# Patient Record
Sex: Female | Born: 1950 | Race: White | Hispanic: No | Smoking: Never smoker
Health system: Southern US, Community
[De-identification: ages and names within clinical notes are randomized; demographics above are authoritative.]

## PROBLEM LIST (undated history)

## (undated) DIAGNOSIS — J449 Chronic obstructive pulmonary disease, unspecified: Secondary | ICD-10-CM

## (undated) DIAGNOSIS — T8859XA Other complications of anesthesia, initial encounter: Secondary | ICD-10-CM

## (undated) DIAGNOSIS — E441 Mild protein-calorie malnutrition: Secondary | ICD-10-CM

## (undated) DIAGNOSIS — Z8614 Personal history of Methicillin resistant Staphylococcus aureus infection: Secondary | ICD-10-CM

## (undated) DIAGNOSIS — E042 Nontoxic multinodular goiter: Secondary | ICD-10-CM

## (undated) DIAGNOSIS — D649 Anemia, unspecified: Secondary | ICD-10-CM

## (undated) DIAGNOSIS — I4510 Unspecified right bundle-branch block: Secondary | ICD-10-CM

## (undated) DIAGNOSIS — K219 Gastro-esophageal reflux disease without esophagitis: Secondary | ICD-10-CM

## (undated) DIAGNOSIS — I4891 Unspecified atrial fibrillation: Secondary | ICD-10-CM

## (undated) DIAGNOSIS — M1990 Unspecified osteoarthritis, unspecified site: Secondary | ICD-10-CM

## (undated) DIAGNOSIS — J45909 Unspecified asthma, uncomplicated: Secondary | ICD-10-CM

## (undated) DIAGNOSIS — M069 Rheumatoid arthritis, unspecified: Secondary | ICD-10-CM

## (undated) DIAGNOSIS — F419 Anxiety disorder, unspecified: Secondary | ICD-10-CM

## (undated) DIAGNOSIS — L0390 Cellulitis, unspecified: Secondary | ICD-10-CM

## (undated) DIAGNOSIS — N184 Chronic kidney disease, stage 4 (severe): Secondary | ICD-10-CM

## (undated) DIAGNOSIS — Z79899 Other long term (current) drug therapy: Secondary | ICD-10-CM

## (undated) DIAGNOSIS — I890 Lymphedema, not elsewhere classified: Secondary | ICD-10-CM

## (undated) DIAGNOSIS — Z7901 Long term (current) use of anticoagulants: Secondary | ICD-10-CM

## (undated) DIAGNOSIS — I10 Essential (primary) hypertension: Secondary | ICD-10-CM

## (undated) DIAGNOSIS — Z7960 Long term (current) use of unspecified immunomodulators and immunosuppressants: Secondary | ICD-10-CM

## (undated) DIAGNOSIS — E785 Hyperlipidemia, unspecified: Secondary | ICD-10-CM

## (undated) HISTORY — PX: MOUTH SURGERY: SHX715

## (undated) HISTORY — PX: CHOLECYSTECTOMY: SHX55

## (undated) HISTORY — DX: Cellulitis, unspecified: L03.90

## (undated) HISTORY — DX: Essential (primary) hypertension: I10

## (undated) HISTORY — DX: Anemia, unspecified: D64.9

## (undated) HISTORY — DX: Unspecified osteoarthritis, unspecified site: M19.90

## (undated) HISTORY — PX: FOOT SURGERY: SHX648

## (undated) HISTORY — PX: GALLBLADDER SURGERY: SHX652

## (undated) HISTORY — PX: WISDOM TOOTH EXTRACTION: SHX21

---

## 2005-06-26 ENCOUNTER — Ambulatory Visit: Payer: Self-pay

## 2006-07-17 ENCOUNTER — Ambulatory Visit: Payer: Self-pay | Admitting: General Surgery

## 2006-07-25 ENCOUNTER — Other Ambulatory Visit: Payer: Self-pay

## 2006-07-25 ENCOUNTER — Ambulatory Visit: Payer: Self-pay | Admitting: General Surgery

## 2006-07-31 ENCOUNTER — Ambulatory Visit: Payer: Self-pay | Admitting: General Surgery

## 2006-07-31 HISTORY — PX: CHOLECYSTECTOMY: SHX55

## 2006-08-23 ENCOUNTER — Ambulatory Visit: Payer: Self-pay | Admitting: Specialist

## 2008-01-23 ENCOUNTER — Ambulatory Visit: Payer: Self-pay | Admitting: Specialist

## 2009-06-16 ENCOUNTER — Ambulatory Visit: Payer: Self-pay | Admitting: Specialist

## 2010-07-18 ENCOUNTER — Ambulatory Visit: Payer: Self-pay | Admitting: Unknown Physician Specialty

## 2014-07-22 DIAGNOSIS — F419 Anxiety disorder, unspecified: Secondary | ICD-10-CM | POA: Insufficient documentation

## 2014-07-22 DIAGNOSIS — I10 Essential (primary) hypertension: Secondary | ICD-10-CM | POA: Insufficient documentation

## 2014-07-22 DIAGNOSIS — K219 Gastro-esophageal reflux disease without esophagitis: Secondary | ICD-10-CM | POA: Insufficient documentation

## 2014-07-22 DIAGNOSIS — J309 Allergic rhinitis, unspecified: Secondary | ICD-10-CM | POA: Insufficient documentation

## 2014-07-23 DIAGNOSIS — M1711 Unilateral primary osteoarthritis, right knee: Secondary | ICD-10-CM | POA: Insufficient documentation

## 2017-03-19 DIAGNOSIS — M170 Bilateral primary osteoarthritis of knee: Secondary | ICD-10-CM | POA: Insufficient documentation

## 2020-02-18 ENCOUNTER — Other Ambulatory Visit: Payer: Self-pay | Admitting: Family Medicine

## 2020-02-18 DIAGNOSIS — Z1231 Encounter for screening mammogram for malignant neoplasm of breast: Secondary | ICD-10-CM

## 2020-03-10 ENCOUNTER — Inpatient Hospital Stay: Admission: RE | Admit: 2020-03-10 | Payer: Self-pay | Source: Ambulatory Visit

## 2020-06-14 ENCOUNTER — Inpatient Hospital Stay: Admission: RE | Admit: 2020-06-14 | Payer: Self-pay | Source: Ambulatory Visit

## 2020-06-24 ENCOUNTER — Inpatient Hospital Stay: Admission: RE | Admit: 2020-06-24 | Payer: Self-pay | Source: Ambulatory Visit

## 2020-09-13 DIAGNOSIS — R531 Weakness: Secondary | ICD-10-CM | POA: Insufficient documentation

## 2020-09-13 DIAGNOSIS — R42 Dizziness and giddiness: Secondary | ICD-10-CM | POA: Insufficient documentation

## 2020-09-14 DIAGNOSIS — G8929 Other chronic pain: Secondary | ICD-10-CM | POA: Insufficient documentation

## 2020-09-14 DIAGNOSIS — M25511 Pain in right shoulder: Secondary | ICD-10-CM | POA: Insufficient documentation

## 2020-09-14 DIAGNOSIS — R300 Dysuria: Secondary | ICD-10-CM | POA: Insufficient documentation

## 2020-09-14 DIAGNOSIS — D72829 Elevated white blood cell count, unspecified: Secondary | ICD-10-CM | POA: Insufficient documentation

## 2020-09-14 DIAGNOSIS — W19XXXA Unspecified fall, initial encounter: Secondary | ICD-10-CM | POA: Insufficient documentation

## 2020-10-21 DIAGNOSIS — I480 Paroxysmal atrial fibrillation: Secondary | ICD-10-CM | POA: Insufficient documentation

## 2020-10-21 DIAGNOSIS — E782 Mixed hyperlipidemia: Secondary | ICD-10-CM | POA: Insufficient documentation

## 2020-10-21 DIAGNOSIS — E785 Hyperlipidemia, unspecified: Secondary | ICD-10-CM | POA: Insufficient documentation

## 2020-10-21 DIAGNOSIS — I4891 Unspecified atrial fibrillation: Secondary | ICD-10-CM | POA: Insufficient documentation

## 2020-11-02 DIAGNOSIS — E876 Hypokalemia: Secondary | ICD-10-CM | POA: Insufficient documentation

## 2021-01-18 ENCOUNTER — Other Ambulatory Visit: Payer: Self-pay | Admitting: Orthopedic Surgery

## 2021-01-18 DIAGNOSIS — M25511 Pain in right shoulder: Secondary | ICD-10-CM

## 2021-02-10 ENCOUNTER — Other Ambulatory Visit: Payer: Medicare Other

## 2021-02-28 ENCOUNTER — Ambulatory Visit
Admission: RE | Admit: 2021-02-28 | Discharge: 2021-02-28 | Disposition: A | Discharge status: Home / Self Care | Payer: Medicare Other | Source: Ambulatory Visit | Attending: Podiatry | Admitting: Podiatry

## 2021-02-28 ENCOUNTER — Other Ambulatory Visit: Payer: Self-pay

## 2021-02-28 ENCOUNTER — Other Ambulatory Visit (HOSPITAL_COMMUNITY): Payer: Self-pay | Admitting: Podiatry

## 2021-02-28 ENCOUNTER — Other Ambulatory Visit: Payer: Self-pay | Admitting: Podiatry

## 2021-02-28 ENCOUNTER — Other Ambulatory Visit
Admission: RE | Admit: 2021-02-28 | Discharge: 2021-02-28 | Disposition: A | Discharge status: Home / Self Care | Payer: Medicare Other | Source: Ambulatory Visit | Attending: Podiatry | Admitting: Podiatry

## 2021-02-28 DIAGNOSIS — R600 Localized edema: Secondary | ICD-10-CM

## 2021-02-28 DIAGNOSIS — M7989 Other specified soft tissue disorders: Secondary | ICD-10-CM | POA: Diagnosis present

## 2021-02-28 DIAGNOSIS — L0390 Cellulitis, unspecified: Secondary | ICD-10-CM | POA: Insufficient documentation

## 2021-02-28 DIAGNOSIS — L03116 Cellulitis of left lower limb: Secondary | ICD-10-CM

## 2021-03-03 LAB — AEROBIC CULTURE W GRAM STAIN (SUPERFICIAL SPECIMEN)
Culture: NO GROWTH
Gram Stain: NONE SEEN

## 2021-03-04 ENCOUNTER — Other Ambulatory Visit: Payer: Medicare Other

## 2021-03-15 ENCOUNTER — Other Ambulatory Visit (INDEPENDENT_AMBULATORY_CARE_PROVIDER_SITE_OTHER): Payer: Self-pay | Admitting: Vascular Surgery

## 2021-03-15 DIAGNOSIS — L97519 Non-pressure chronic ulcer of other part of right foot with unspecified severity: Secondary | ICD-10-CM

## 2021-03-15 DIAGNOSIS — M79671 Pain in right foot: Secondary | ICD-10-CM

## 2021-03-15 DIAGNOSIS — M79672 Pain in left foot: Secondary | ICD-10-CM

## 2021-03-17 ENCOUNTER — Ambulatory Visit (INDEPENDENT_AMBULATORY_CARE_PROVIDER_SITE_OTHER): Payer: Medicare Other

## 2021-03-17 ENCOUNTER — Ambulatory Visit (INDEPENDENT_AMBULATORY_CARE_PROVIDER_SITE_OTHER): Payer: Medicare Other | Admitting: Vascular Surgery

## 2021-03-17 ENCOUNTER — Other Ambulatory Visit: Payer: Self-pay

## 2021-03-17 ENCOUNTER — Encounter (INDEPENDENT_AMBULATORY_CARE_PROVIDER_SITE_OTHER): Payer: Self-pay | Admitting: Vascular Surgery

## 2021-03-17 VITALS — BP 119/70 | HR 88 | Resp 16 | Ht 64.0 in | Wt 209.0 lb

## 2021-03-17 DIAGNOSIS — M79672 Pain in left foot: Secondary | ICD-10-CM | POA: Diagnosis not present

## 2021-03-17 DIAGNOSIS — I10 Essential (primary) hypertension: Secondary | ICD-10-CM | POA: Diagnosis not present

## 2021-03-17 DIAGNOSIS — L97511 Non-pressure chronic ulcer of other part of right foot limited to breakdown of skin: Secondary | ICD-10-CM | POA: Diagnosis not present

## 2021-03-17 DIAGNOSIS — M79671 Pain in right foot: Secondary | ICD-10-CM

## 2021-03-17 DIAGNOSIS — L97519 Non-pressure chronic ulcer of other part of right foot with unspecified severity: Secondary | ICD-10-CM

## 2021-03-17 DIAGNOSIS — I480 Paroxysmal atrial fibrillation: Secondary | ICD-10-CM

## 2021-03-17 DIAGNOSIS — I890 Lymphedema, not elsewhere classified: Secondary | ICD-10-CM

## 2021-03-17 DIAGNOSIS — L97509 Non-pressure chronic ulcer of other part of unspecified foot with unspecified severity: Secondary | ICD-10-CM | POA: Insufficient documentation

## 2021-03-17 DIAGNOSIS — K219 Gastro-esophageal reflux disease without esophagitis: Secondary | ICD-10-CM

## 2021-03-17 NOTE — Progress Notes (Signed)
MRN : [id number]  [name] is a 70 y.o. (Sep 20, 1951) female who presents with chief complaint of  Chief Complaint  Patient presents with  . New Patient (Initial Visit)    Ref [name] right toe ulcer,bil feet pain,leg swelling  .  History of Present Illness:   Patient is seen for evaluation of leg swelling. The patient first noticed the swelling remotely but is now concerned because of a significant increase in the overall edema. The swelling is associated with pain and discoloration. The patient notes that in the morning the legs are significantly improved but they steadily worsened throughout the course of the day. Elevation makes the legs better, dependency makes them much worse.   There is a history of ulceration of the tip of the right great toe.  This has been improving per the patient.   The patient denies any recent changes in their medications.  The patient has not been wearing graduated compression.  The patient has no had any past angiography, interventions or vascular surgery.  The patient denies a history of DVT or PE. There is no prior history of phlebitis. There is no history of primary lymphedema.  There is no history of radiation treatment to the groin or pelvis No history of malignancies. No history of trauma or groin or pelvic surgery. No history of foreign travel or parasitic infections area   ABI's obtained today are normal bilaterally  Current Meds  Medication Sig  . acetaminophen (TYLENOL) 650 MG CR tablet Take by mouth.  Marland Kitchen albuterol (VENTOLIN HFA) 108 (90 Base) MCG/ACT inhaler Inhale into the lungs.  Marland Kitchen amLODipine (NORVASC) 10 MG tablet Take 1 tablet by mouth daily.  Marland Kitchen apixaban (ELIQUIS) 5 MG TABS tablet Take 1 tablet by mouth 2 (two) times daily.  Marland Kitchen atorvastatin (LIPITOR) 10 MG tablet Take by mouth.  . budesonide-formoterol (SYMBICORT) 80-4.5 MCG/ACT inhaler Inhale into the lungs.  . ciprofloxacin (CIPRO) 500 MG tablet Take by mouth.  .  diclofenac Sodium (VOLTAREN) 1 % GEL Apply 2 g topically 4 (four) times daily.  Marland Kitchen HYDROcodone-acetaminophen (NORCO/VICODIN) 5-325 MG tablet Take by mouth.  . loratadine (CLARITIN) 10 MG tablet Take by mouth.  . magnesium oxide (MAG-OX) 400 MG tablet Take 1 tablet by mouth daily.  Marland Kitchen PARoxetine (PAXIL) 30 MG tablet Take by mouth.  . potassium chloride (KLOR-CON) 10 MEQ tablet Take by mouth.  . sulfamethoxazole-trimethoprim (BACTRIM DS) 800-160 MG tablet Take by mouth.  . triamterene-hydrochlorothiazide (MAXZIDE-25) 37.5-25 MG tablet Take 1 tablet by mouth daily.    Past Medical History:  Diagnosis Date  . Anemia   . Arthritis   . Cellulitis   . Hypertension       Social History Social History   Tobacco Use  . Smoking status: Never Smoker  . Smokeless tobacco: Never Used  Substance Use Topics  . Alcohol use: Never  . Drug use: Never    Family History Family History  Problem Relation Age of Onset  . Diabetes Mother   . Hypertension Mother   . Hypertension Father   No family history of bleeding/clotting disorders, porphyria or autoimmune disease   Allergies  Allergen Reactions  . Amoxicillin-Pot Clavulanate Nausea Only  . Valdecoxib Rash     REVIEW OF SYSTEMS (Negative unless checked)  Constitutional: '[]'$ Weight loss  '[]'$ Fever  '[]'$ Chills Cardiac: '[]'$ Chest pain   '[]'$ Chest pressure   '[]'$ Palpitations   '[]'$ Shortness of breath when laying flat   '[]'$ Shortness of breath with exertion. Vascular:  '[]'$ Pain in  legs with walking   '[x]'$ Pain in legs at rest  '[]'$ History of DVT   '[]'$ Phlebitis   '[x]'$ Swelling in legs   '[]'$ Varicose veins   '[]'$ Non-healing ulcers Pulmonary:   '[]'$ Uses home oxygen   '[]'$ Productive cough   '[]'$ Hemoptysis   '[]'$ Wheeze  '[]'$ COPD   '[]'$ Asthma Neurologic:  '[]'$ Dizziness   '[]'$ Seizures   '[]'$ History of stroke   '[]'$ History of TIA  '[]'$ Aphasia   '[]'$ Vissual changes   '[]'$ Weakness or numbness in arm   '[]'$ Weakness or numbness in leg Musculoskeletal:   '[]'$ Joint swelling   '[x]'$ Joint pain   '[]'$ Low back  pain Hematologic:  '[]'$ Easy bruising  '[]'$ Easy bleeding   '[]'$ Hypercoagulable state   '[]'$ Anemic Gastrointestinal:  '[]'$ Diarrhea   '[]'$ Vomiting  '[]'$ Gastroesophageal reflux/heartburn   '[]'$ Difficulty swallowing. Genitourinary:  '[]'$ Chronic kidney disease   '[]'$ Difficult urination  '[]'$ Frequent urination   '[]'$ Blood in urine Skin:  '[]'$ Rashes   '[]'$ Ulcers  Psychological:  '[]'$ History of anxiety   '[]'$  History of major depression.  Physical Examination  Vitals:   03/17/21 1419  BP: 119/70  Pulse: 88  Resp: 16  Weight: 209 lb (94.8 kg)  Height: '5\' 4"'$  (1.626 m)   Body mass index is 35.87 kg/m. Gen: WD/WN, NAD Head: [place]/AT, No temporalis wasting.  Ear/Nose/Throat: Hearing grossly intact, nares w/o erythema or drainage, poor dentition Eyes: PER, EOMI, sclera nonicteric.  Neck: Supple, no masses.  No bruit or JVD.  Pulmonary:  Good air movement, clear to auscultation bilaterally, no use of accessory muscles.  Cardiac: RRR, normal S1, S2, no Murmurs. Vascular: right great toe ulcer superficial and noninfected, scattered varicosities present bilaterally.  Mild venous stasis changes to the legs bilaterally.  3-4+ soft pitting edema. Vessel Right Left  Radial Palpable Palpable  PT Palpable Palpable  DP Palpable Palpable  Gastrointestinal: soft, non-distended. No guarding/no peritoneal signs.  Musculoskeletal: M/S 5/5 throughout.  No deformity or atrophy.  Neurologic: CN 2-12 intact. Pain and light touch intact in extremities.  Symmetrical.  Speech is fluent. Motor exam as listed above. Psychiatric: Judgment intact, Mood & affect appropriate for pt's clinical situation. Dermatologic: mild venous rashes.  No changes consistent with cellulitis. Lymph : + lichenification or skin changes of chronic lymphedema.  CBC No results found for: WBC, HGB, HCT, MCV, PLT  BMET No results found for: NA, K, CL, CO2, GLUCOSE, BUN, CREATININE, CALCIUM, GFRNONAA, GFRAA CrCl cannot be calculated (No successful lab value  found.).  COAG No results found for: INR, PROTIME  Radiology US Venous Img Lower Unilateral Left (DVT)  Result Date: 02/28/2021 CLINICAL DATA:  Left lower extremity pain and edema for past week. Evaluate for DVT. EXAM: LEFT LOWER EXTREMITY VENOUS DOPPLER ULTRASOUND TECHNIQUE: Gray-scale sonography with graded compression, as well as color Doppler and duplex ultrasound were performed to evaluate the lower extremity deep venous systems from the level of the common femoral vein and including the common femoral, femoral, profunda femoral, popliteal and calf veins including the posterior tibial, peroneal and gastrocnemius veins when visible. The superficial great saphenous vein was also interrogated. Spectral Doppler was utilized to evaluate flow at rest and with distal augmentation maneuvers in the common femoral, femoral and popliteal veins. COMPARISON:  None. FINDINGS: Contralateral Common Femoral Vein: Respiratory phasicity is normal and symmetric with the symptomatic side. No evidence of thrombus. Normal compressibility. Common Femoral Vein: No evidence of thrombus. Normal compressibility, respiratory phasicity and response to augmentation. Saphenofemoral Junction: No evidence of thrombus. Normal compressibility and flow on color Doppler imaging. Profunda Femoral Vein: No evidence of thrombus. Normal compressibility  and flow on color Doppler imaging. Femoral Vein: No evidence of thrombus. Normal compressibility, respiratory phasicity and response to augmentation. Popliteal Vein: No evidence of thrombus. Normal compressibility, respiratory phasicity and response to augmentation. Calf Veins: No evidence of thrombus. Normal compressibility and flow on color Doppler imaging. Superficial Great Saphenous Vein: No evidence of thrombus. Normal compressibility. Venous Reflux:  None. Other Findings: There is a minimal to moderate amount of subcutaneous edema at the level of the left calf (image 36). IMPRESSION: No  evidence DVT within the left lower extremity. Electronically Signed   By: Sandi Mariscal M.D.   On: 02/28/2021 13:43     Assessment/Plan 1. Skin ulcer of right great toe, limited to breakdown of skin ([place]) This appears superficial and secondary to pressure against the shoe  2. Lymphedema I have had a long discussion with the patient regarding swelling and why it  causes symptoms.  Patient will begin wearing graduated compression stockings class 1 (20-30 mmHg) on a daily basis a prescription was given. The patient will  beginning wearing the stockings first thing in the morning and removing them in the evening. The patient is instructed specifically not to sleep in the stockings.   In addition, behavioral modification will be initiated.  This will include frequent elevation, use of over the counter pain medications and exercise such as walking.  I have reviewed systemic causes for chronic edema such as liver, kidney and cardiac etiologies.  The patient denies problems with these organ systems.    Consideration for a lymph pump will also be made based upon the effectiveness of conservative therapy.  This would help to improve the edema control and prevent sequela such as ulcers and infections   Patient should undergo duplex ultrasound of the venous system to ensure that DVT or reflux is not present.  The patient will follow-up with me after the ultrasound.    3. Benign essential hypertension Continue antihypertensive medications as already ordered, these medications have been reviewed and there are no changes at this time.   4. Paroxysmal A-fib (HCC) Continue antiarrhythmia medications as already ordered, these medications have been reviewed and there are no changes at this time.  Continue anticoagulation as ordered by Cardiology Service   5. Gastroesophageal reflux disease, unspecified whether esophagitis present Continue PPI as already ordered, this medication has been reviewed and  there are no changes at this time.  Avoidence of caffeine and alcohol  Moderate elevation of the head of the bed     Hortencia Pilar, MD  03/17/2021 2:32 PM

## 2021-03-22 ENCOUNTER — Other Ambulatory Visit: Payer: Medicare Other

## 2021-03-26 ENCOUNTER — Other Ambulatory Visit: Payer: Medicare Other

## 2021-05-26 ENCOUNTER — Other Ambulatory Visit: Payer: Self-pay

## 2021-05-26 ENCOUNTER — Encounter: Payer: Medicare Other | Attending: Physician Assistant | Admitting: Physician Assistant

## 2021-05-26 DIAGNOSIS — I482 Chronic atrial fibrillation, unspecified: Secondary | ICD-10-CM | POA: Insufficient documentation

## 2021-05-26 DIAGNOSIS — I872 Venous insufficiency (chronic) (peripheral): Secondary | ICD-10-CM | POA: Diagnosis not present

## 2021-05-26 DIAGNOSIS — I480 Paroxysmal atrial fibrillation: Secondary | ICD-10-CM | POA: Insufficient documentation

## 2021-05-26 DIAGNOSIS — N189 Chronic kidney disease, unspecified: Secondary | ICD-10-CM | POA: Diagnosis not present

## 2021-05-26 DIAGNOSIS — L84 Corns and callosities: Secondary | ICD-10-CM | POA: Insufficient documentation

## 2021-05-26 DIAGNOSIS — I890 Lymphedema, not elsewhere classified: Secondary | ICD-10-CM | POA: Insufficient documentation

## 2021-05-26 DIAGNOSIS — L97812 Non-pressure chronic ulcer of other part of right lower leg with fat layer exposed: Secondary | ICD-10-CM | POA: Diagnosis not present

## 2021-05-26 DIAGNOSIS — Z7901 Long term (current) use of anticoagulants: Secondary | ICD-10-CM | POA: Diagnosis not present

## 2021-05-26 DIAGNOSIS — I129 Hypertensive chronic kidney disease with stage 1 through stage 4 chronic kidney disease, or unspecified chronic kidney disease: Secondary | ICD-10-CM | POA: Insufficient documentation

## 2021-05-26 NOTE — Progress Notes (Addendum)
[name], [name] ([id number]) Visit Report for 05/26/2021 Allergy List Details Patient Name: Haley, [name]. Date of Service: 05/26/2021 10:00 AM Medical Record Number: [id number] Patient Account Number: [id number] Date of Birth/Sex: October 17, 1951 (69 y.o. F) Treating RN: Donnamarie Poag Primary Care Severo Beber: Thereasa Distance Other Clinician: Referring Adryen Cookson: Thereasa Distance Treating Trishelle Devora/Extender: Jeri Cos Weeks in Treatment: 0 Allergies Active Allergies Augmentin Reaction: rash Severity: Moderate Sulfa (Sulfonamide Antibiotics) Reaction: itching Severity: Moderate Allergy Notes Electronic Signature(s) Signed: 05/26/2021 11:06:31 AM By: Donnamarie Poag Entered By: Donnamarie Poag on 05/26/2021 10:30:28 [name], [name] ([id number]) -------------------------------------------------------------------------------- Arrival Information Details Patient Name: [name], [name] Date of Service: 05/26/2021 10:00 AM Medical Record Number: [id number] Patient Account Number: [id number] Date of Birth/Sex: [birth year] (69 y.o. F) Treating RN: Donnamarie Poag Primary Care Callee Rohrig: Thereasa Distance Other Clinician: Referring Tishana Clinkenbeard: Thereasa Distance Treating Linde Wilensky/Extender: Skipper Cliche in Treatment: 0 Visit Information Patient Arrived: Wheel Chair Arrival Time: 10:27 Accompanied By: friend Transfer Assistance: EasyPivot Patient Lift Patient Identification Verified: Yes Secondary Verification Process Completed: Yes Patient Has Alerts: Yes Patient Alerts: Patient on Blood Thinner ELIQUIS ABI at vascular 03/17/21 ABI L-0.69 R-0.67 Electronic Signature(s) Signed: 05/26/2021 11:06:31 AM By: Donnamarie Poag Entered By: Donnamarie Poag on 05/26/2021 10:50:28 [name], [name] ([id number]) -------------------------------------------------------------------------------- Clinic Level of Care Assessment Details Patient Name: [name], [name] Date of Service: 05/26/2021 10:00 AM Medical Record Number:  [id number] Patient Account Number: [id number] Date of Birth/Sex: July 01, 1951 (69 y.o. F) Treating RN: Dolan Amen Primary Care Kathrine Rieves: Thereasa Distance Other Clinician: Referring Takeela Peil: Thereasa Distance Treating Keyandre Pileggi/Extender: Skipper Cliche in Treatment: 0 Clinic Level of Care Assessment Items TOOL 1 Quantity Score X - Use when EandM and Procedure is performed on INITIAL visit 1 0 ASSESSMENTS - Nursing Assessment / Reassessment X - General Physical Exam (combine w/ comprehensive assessment (listed just below) when performed on new 1 20 pt. evals) X- 1 25 Comprehensive Assessment (HX, ROS, Risk Assessments, Wounds Hx, etc.) ASSESSMENTS - Wound and Skin Assessment / Reassessment '[]'$  - Dermatologic / Skin Assessment (not related to wound area) 0 ASSESSMENTS - Ostomy and/or Continence Assessment and Care '[]'$  - Incontinence Assessment and Management 0 '[]'$  - 0 Ostomy Care Assessment and Management (repouching, etc.) PROCESS - Coordination of Care X - Simple Patient / Family Education for ongoing care 1 15 '[]'$  - 0 Complex (extensive) Patient / Family Education for ongoing care X- 1 10 Staff obtains Consents, Records, Test Results / Process Orders '[]'$  - 0 Staff telephones HHA, Nursing Homes / Clarify orders / etc '[]'$  - 0 Routine Transfer to another Facility (non-emergent condition) '[]'$  - 0 Routine Hospital Admission (non-emergent condition) '[]'$  - 0 New Admissions / Biomedical engineer / Ordering NPWT, Apligraf, etc. '[]'$  - 0 Emergency Hospital Admission (emergent condition) PROCESS - Special Needs '[]'$  - Pediatric / Minor Patient Management 0 '[]'$  - 0 Isolation Patient Management '[]'$  - 0 Hearing / Language / Visual special needs '[]'$  - 0 Assessment of Community assistance (transportation, D/C planning, etc.) '[]'$  - 0 Additional assistance / Altered mentation '[]'$  - 0 Support Surface(s) Assessment (bed, cushion, seat, etc.) INTERVENTIONS - Miscellaneous '[]'$  - External ear exam  0 '[]'$  - 0 Patient Transfer (multiple staff / Civil Service fast streamer / Similar devices) '[]'$  - 0 Simple Staple / Suture removal (25 or less) '[]'$  - 0 Complex Staple / Suture removal (26 or more) '[]'$  - 0 Hypo/Hyperglycemic Management (do not check if billed separately) '[]'$  - 0 Ankle / Brachial Index (ABI) - do not check if billed separately Has  the patient been seen at the hospital within the last three years: Yes Total Score: 70 Level Of Care: New/Established - Level 2 [name], [name] ([id number]) Electronic Signature(s) Signed: 05/26/2021 4:29:08 PM By: Georges Mouse, Minus Breeding RN Entered By: Georges Mouse, Minus Breeding on 05/26/2021 11:46:43 [name], [name] ([id number]) -------------------------------------------------------------------------------- Encounter Discharge Information Details Patient Name: [name], [name] Date of Service: 05/26/2021 10:00 AM Medical Record Number: [id number] Patient Account Number: [id number] Date of Birth/Sex: 1951-10-20 (69 y.o. F) Treating RN: Dolan Amen Primary Care Joyell Emami: Thereasa Distance Other Clinician: Referring Hilbert Briggs: Thereasa Distance Treating Sharief Wainwright/Extender: Skipper Cliche in Treatment: 0 Encounter Discharge Information Items Discharge Condition: Stable Ambulatory Status: Wheelchair Discharge Destination: Home Transportation: Private Auto Accompanied By: friend Schedule Follow-up Appointment: Yes Clinical Summary of Care: Electronic Signature(s) Signed: 05/26/2021 4:32:45 PM By: Jeanine Luz Entered By: Jeanine Luz on 05/26/2021 12:06:50 [name], [name] ([id number]) -------------------------------------------------------------------------------- Lower Extremity Assessment Details Patient Name: [name], [name] Date of Service: 05/26/2021 10:00 AM Medical Record Number: [id number] Patient Account Number: [id number] Date of Birth/Sex: [birth year] (69 y.o. F) Treating RN: Donnamarie Poag Primary Care San Lohmeyer: Thereasa Distance Other  Clinician: Referring Sydnee Lamour: Thereasa Distance Treating Frady Taddeo/Extender: Skipper Cliche in Treatment: 0 Edema Assessment Assessed: Shirlyn Goltz: Yes] Patrice Paradise: Yes] Edema: [Left: Yes] [Right: Yes] Calf Left: Right: Point of Measurement: 29 cm From Medial Instep 31 cm 31 cm Ankle Left: Right: Point of Measurement: 10 cm From Medial Instep 18.5 cm 19 cm Knee To Floor Left: Right: From Medial Instep 35 cm 35 cm Vascular Assessment Pulses: Dorsalis Pedis Palpable: [Left:Yes] [Right:Yes] Electronic Signature(s) Signed: 05/26/2021 11:06:31 AM By: Donnamarie Poag Entered ByDonnamarie Poag on 05/26/2021 10:46:52 [name], [name] ([id number]) -------------------------------------------------------------------------------- Multi Wound Chart Details Patient Name: [name], [name] Date of Service: 05/26/2021 10:00 AM Medical Record Number: [id number] Patient Account Number: [id number] Date of Birth/Sex: 1951-07-21 (69 y.o. F) Treating RN: Dolan Amen Primary Care Siaosi Alter: Thereasa Distance Other Clinician: Referring Dartanion Teo: Thereasa Distance Treating Hassel Uphoff/Extender: Skipper Cliche in Treatment: 0 Photos: [N/A:N/A] Wound Location: Right Lower Leg N/A N/A Wounding Event: Gradually Appeared N/A N/A Primary Etiology: Venous Leg Ulcer N/A N/A Comorbid History: Cataracts, Anemia, Asthma, Sleep N/A N/A Apnea, Arrhythmia, Coronary Artery Disease, Hypertension, History of pressure wounds, Rheumatoid Arthritis, Osteoarthritis Date Acquired: 04/20/2021 N/A N/A Weeks of Treatment: 0 N/A N/A Wound Status: Open N/A N/A Measurements L x W x D (cm) 1x0.7x0.1 N/A N/A Area (cm) : 0.55 N/A N/A Volume (cm) : 0.055 N/A N/A Classification: Full Thickness Without Exposed N/A N/A Support Structures Exudate Amount: Medium N/A N/A Exudate Type: Serosanguineous N/A N/A Exudate Color: red, brown N/A N/A Granulation Amount: Large (67-100%) N/A N/A Granulation Quality: Red, Pink, Pale N/A N/A Necrotic  Amount: Small (1-33%) N/A N/A Exposed Structures: Fat Layer (Subcutaneous Tissue): N/A N/A Yes Fascia: No Tendon: No Muscle: No Joint: No Bone: No Treatment Notes Electronic Signature(s) Signed: 05/26/2021 4:29:08 PM By: Georges Mouse, Minus Breeding RN Entered By: Georges Mouse, Minus Breeding on 05/26/2021 11:34:58 [name], [name] ([id number]) -------------------------------------------------------------------------------- [place] Details Patient Name: [name], [name] Date of Service: 05/26/2021 10:00 AM Medical Record Number: [id number] Patient Account Number: [id number] Date of Birth/Sex: November 07, 1951 (69 y.o. F) Treating RN: Dolan Amen Primary Care Tamika Shropshire: Thereasa Distance Other Clinician: Referring Dorothee Napierkowski: Thereasa Distance Treating Seana Underwood/Extender: Skipper Cliche in Treatment: 0 Active Inactive Orientation to the Wound Care Program Nursing Diagnoses: Knowledge deficit related to the wound healing center program Goals: Patient/caregiver will verbalize understanding of the [place] Program Date Initiated: 05/26/2021 Target  Resolution Date: 05/26/2021 Goal Status: Active Interventions: Provide education on orientation to the wound center Notes: Wound/Skin Impairment Nursing Diagnoses: Impaired tissue integrity Goals: Patient/caregiver will verbalize understanding of skin care regimen Date Initiated: 05/26/2021 Target Resolution Date: 05/26/2021 Goal Status: Active Ulcer/skin breakdown will have a volume reduction of 30% by week 4 Date Initiated: 05/26/2021 Target Resolution Date: 06/25/2021 Goal Status: Active Ulcer/skin breakdown will have a volume reduction of 50% by week 8 Date Initiated: 05/26/2021 Target Resolution Date: 07/26/2021 Goal Status: Active Ulcer/skin breakdown will have a volume reduction of 80% by week 12 Date Initiated: 05/26/2021 Target Resolution Date: 08/26/2021 Goal Status: Active Interventions: Assess patient/caregiver ability  to obtain necessary supplies Assess patient/caregiver ability to perform ulcer/skin care regimen upon admission and as needed Assess ulceration(s) every visit Provide education on ulcer and skin care Treatment Activities: Patient referred to home care : 05/26/2021 Skin care regimen initiated : 05/26/2021 Notes: Electronic Signature(s) Signed: 05/26/2021 4:29:08 PM By: Georges Mouse, Minus Breeding RN Entered By: Georges Mouse, Minus Breeding on 05/26/2021 11:33:14 [name], [name] ([id number]) -------------------------------------------------------------------------------- Pain Assessment Details Patient Name: [name], [name] Date of Service: 05/26/2021 10:00 AM Medical Record Number: [id number] Patient Account Number: [id number] Date of Birth/Sex: 1951/08/01 (69 y.o. F) Treating RN: Donnamarie Poag Primary Care Tecia Cinnamon: Thereasa Distance Other Clinician: Referring Macalister Arnaud: Thereasa Distance Treating Kristin Barcus/Extender: Skipper Cliche in Treatment: 0 Active Problems Location of Pain Severity and Description of Pain Patient Has Paino Yes Site Locations Pain Location: Generalized Pain Rate the pain. Current Pain Level: 5 Pain Management and Medication Current Pain Management: Notes LEG PAINS Electronic Signature(s) Signed: 05/26/2021 11:06:31 AM By: Donnamarie Poag Entered By: Donnamarie Poag on 05/26/2021 10:29:00 [name], [name] ([id number]) -------------------------------------------------------------------------------- Patient/Caregiver Education Details Patient Name: Coots, [name] Date of Service: 05/26/2021 10:00 AM Medical Record Number: [id number] Patient Account Number: [id number] Date of Birth/Gender: 02/15/1951 (69 y.o. F) Treating RN: Dolan Amen Primary Care Physician: Thereasa Distance Other Clinician: Referring Physician: Thereasa Distance Treating Physician/Extender: Skipper Cliche in Treatment: 0 Education Assessment Education Provided To: Patient Education Topics  Provided Wound/Skin Impairment: Methods: Explain/Verbal Responses: State content correctly Electronic Signature(s) Signed: 05/26/2021 4:29:08 PM By: Georges Mouse, Minus Breeding RN Entered By: Georges Mouse, Minus Breeding on 05/26/2021 11:47:10 Mallozzi, [name] ([id number]) -------------------------------------------------------------------------------- Wound Assessment Details Patient Name: Humphreys, [name] Date of Service: 05/26/2021 10:00 AM Medical Record Number: [id number] Patient Account Number: [id number] Date of Birth/Sex: [birth year] (69 y.o. F) Treating RN: Donnamarie Poag Primary Care Sahasra Belue: Thereasa Distance Other Clinician: Referring Kristine Chahal: Thereasa Distance Treating Alexiah Koroma/Extender: Skipper Cliche in Treatment: 0 Wound Status Wound Number: 1 Primary Venous Leg Ulcer Etiology: Wound Location: Right Lower Leg Wound Open Wounding Event: Gradually Appeared Status: Date Acquired: 04/20/2021 Comorbid Cataracts, Anemia, Asthma, Sleep Apnea, Arrhythmia, Weeks Of Treatment: 0 History: Coronary Artery Disease, Hypertension, History of pressure Clustered Wound: No wounds, Rheumatoid Arthritis, Osteoarthritis Photos Wound Measurements Length: (cm) 1 Width: (cm) 0.7 Depth: (cm) 0.1 Area: (cm) 0.55 Volume: (cm) 0.055 % Reduction in Area: % Reduction in Volume: Tunneling: No Undermining: No Wound Description Classification: Full Thickness Without Exposed Support Structures Exudate Amount: Medium Exudate Type: Serosanguineous Exudate Color: red, brown Foul Odor After Cleansing: No Slough/Fibrino Yes Wound Bed Granulation Amount: Large (67-100%) Exposed Structure Granulation Quality: Red, Pink, Pale Fascia Exposed: No Necrotic Amount: Small (1-33%) Fat Layer (Subcutaneous Tissue) Exposed: Yes Necrotic Quality: Adherent Slough Tendon Exposed: No Muscle Exposed: No Joint Exposed: No Bone Exposed: No Treatment Notes Wound #1 (Lower Leg) Wound Laterality: Right Cleanser Soap  and Water  Discharge Instruction: Gently cleanse wound with antibacterial soap, rinse and pat dry prior to dressing wounds Peri-Wound Care Ognibene, Alicya W. ([id number]) Topical Primary Dressing Xeroform-HBD 2x2 (in/in) Discharge Instruction: Apply Xeroform-HBD 2x2 (in/in) as directed Secondary Dressing ABD Pad 5x9 (in/in) Discharge Instruction: Cover with ABD pad Secured With 72M ACE Elastic Bandage With VELCRO Brand Closure, 4 (in) Discharge Instruction: Secure kerlix from base of foot to right below knee Kerlix Roll Sterile or Non-Sterile 6-ply 4.5x4 (yd/yd) Discharge Instruction: Apply Kerlix as directed 72M Medipore H Soft Cloth Surgical Tape, 2x2 (in/yd) Discharge Instruction: Secure kerlix Compression Wrap Compression Stockings Add-Ons Electronic Signature(s) Signed: 05/26/2021 11:06:31 AM By: Donnamarie Poag Entered By: Donnamarie Poag on 05/26/2021 10:48:49 Mcneese, [name] ([id number]) -------------------------------------------------------------------------------- [place] Details Patient Name: Coral, [name] Date of Service: 05/26/2021 10:00 AM Medical Record Number: [id number] Patient Account Number: [id number] Date of Birth/Sex: 1951-08-25 (69 y.o. F) Treating RN: Donnamarie Poag Primary Care Doha Boling: Thereasa Distance Other Clinician: Referring Zaiya Annunziato: Thereasa Distance Treating Alesandro Stueve/Extender: Skipper Cliche in Treatment: 0 Vital Signs Time Taken: 10:25 Reference Range: 80 - 120 mg / dl Height (in): 64 Source: Stated Weight (lbs): 207 Source: Stated Body Mass Index (BMI): 35.5 Notes unable to stand on scale Electronic Signature(s) Signed: 05/26/2021 11:06:31 AM By: Donnamarie Poag Entered ByDonnamarie Poag on 05/26/2021 10:29:37

## 2021-05-26 NOTE — Progress Notes (Signed)
[place], [name] ([id number]) Visit Report for 05/26/2021 Abuse/Suicide Risk Screen Details Patient Name: [name]. Date of Service: 05/26/2021 10:00 AM Medical Record Number: [id number] Patient Account Number: [id number] Date of Birth/Sex: 01/29/1951 (69 y.o. Female) Treating RN: Donnamarie Poag Primary Care Mavric Cortright: Thereasa Distance Other Clinician: Referring Bayan Hedstrom: Thereasa Distance Treating Aysia Lowder/Extender: Skipper Cliche in Treatment: 0 Abuse/Suicide Risk Screen Items Answer ABUSE RISK SCREEN: Has anyone close to you tried to hurt or harm you recentlyo No Do you feel uncomfortable with anyone in your familyo No Has anyone forced you do things that you didnot want to doo No Electronic Signature(s) Signed: 05/26/2021 11:06:31 AM By: Donnamarie Poag Entered ByDonnamarie Poag on 05/26/2021 10:39:11 [name], [name] ([id number]) -------------------------------------------------------------------------------- Activities of Daily Living Details Patient Name: [name], [name] Date of Service: 05/26/2021 10:00 AM Medical Record Number: [id number] Patient Account Number: [id number] Date of Birth/Sex: November 04, 1951 (69 y.o. Female) Treating RN: Donnamarie Poag Primary Care Nastasia Kage: Thereasa Distance Other Clinician: Referring Monte Bronder: Thereasa Distance Treating Carletha Dawn/Extender: Skipper Cliche in Treatment: 0 Activities of Daily Living Items Answer Activities of Daily Living (Please select one for each item) Drive Automobile Need Assistance Take Medications Completely Able Use Telephone Completely Able Care for Appearance Completely Able Use Toilet Completely Able Bath / Shower Need Assistance Dress Self Completely Able Feed Self Completely Able Walk Need Assistance Get In / Out Bed Need Assistance Housework Need Assistance Prepare Meals Need Assistance Handle Money Completely Able Shop for Self Not Able Notes needs assist but does not have help at home other than [facility] for  nursing and assist with leg dressings Electronic Signature(s) Signed: 05/26/2021 11:06:31 AM By: Donnamarie Poag Entered By: Donnamarie Poag on 05/26/2021 10:40:55 [name], [name] ([id number]) -------------------------------------------------------------------------------- Education Screening Details Patient Name: [name], [name] Date of Service: 05/26/2021 10:00 AM Medical Record Number: [id number] Patient Account Number: [id number] Date of Birth/Sex: 1951-11-10 (69 y.o. Female) Treating RN: Donnamarie Poag Primary Care Nichalas Coin: Thereasa Distance Other Clinician: Referring Janthony Holleman: Thereasa Distance Treating Eniola Cerullo/Extender: Skipper Cliche in Treatment: 0 Primary Learner Assessed: Patient Learning Preferences/Education Level/Primary Language Learning Preference: Explanation Highest Education Level: High School Preferred Language: English Cognitive Barrier Language Barrier: No Translator Needed: No Memory Deficit: No Emotional Barrier: No Cultural/Religious Beliefs Affecting Medical Care: No Physical Barrier Impaired Vision: No Impaired Hearing: No Decreased Hand dexterity: No Knowledge/Comprehension Knowledge Level: High Comprehension Level: High Ability to understand written instructions: High Ability to understand verbal instructions: High Motivation Anxiety Level: Calm Cooperation: Cooperative Education Importance: Acknowledges Need Interest in Health Problems: Asks Questions Perception: Coherent Willingness to Engage in Self-Management High Activities: Readiness to Engage in Self-Management High Activities: Electronic Signature(s) Signed: 05/26/2021 11:06:31 AM By: Donnamarie Poag Entered ByDonnamarie Poag on 05/26/2021 10:41:40 [name], [name] ([id number]) -------------------------------------------------------------------------------- Fall Risk Assessment Details Patient Name: [name], [name] Date of Service: 05/26/2021 10:00 AM Medical Record Number: [id number] Patient  Account Number: [id number] Date of Birth/Sex: 1951/04/09 (69 y.o. Female) Treating RN: Donnamarie Poag Primary Care Leanthony Rhett: Thereasa Distance Other Clinician: Referring Sumi Lye: Thereasa Distance Treating Tyonna Talerico/Extender: Skipper Cliche in Treatment: 0 Fall Risk Assessment Items Have you had 2 or more falls in the last 12 monthso 0 No Have you had any fall that resulted in injury in the last 12 monthso 0 No FALLS RISK SCREEN History of falling - immediate or within 3 months 0 No Secondary diagnosis (Do you have 2 or more medical diagnoseso) 0 No Ambulatory aid None/bed rest/wheelchair/nurse 0 No Crutches/cane/walker 15 Yes Furniture 30 Yes  Intravenous therapy Access/Saline/Heparin Lock 0 No Gait/Transferring Normal/ bed rest/ wheelchair 0 No Weak (short steps with or without shuffle, stooped but able to lift head while walking, may 0 No seek support from furniture) Impaired (short steps with shuffle, may have difficulty arising from chair, head down, impaired 20 Yes balance) Mental Status Oriented to own ability 0 Yes Electronic Signature(s) Signed: 05/26/2021 11:06:31 AM By: Donnamarie Poag Entered By: Donnamarie Poag on 05/26/2021 10:42:07 [name], [name] ([id number]) -------------------------------------------------------------------------------- Foot Assessment Details Patient Name: [name], [name] Date of Service: 05/26/2021 10:00 AM Medical Record Number: [id number] Patient Account Number: [id number] Date of Birth/Sex: 1951-01-07 (69 y.o. Female) Treating RN: Donnamarie Poag Primary Care Ame Heagle: Thereasa Distance Other Clinician: Referring Anila Bojarski: Thereasa Distance Treating Yareth Macdonnell/Extender: Skipper Cliche in Treatment: 0 Foot Assessment Items Site Locations + = Sensation present, - = Sensation absent, C = Callus, U = Ulcer R = Redness, W = Warmth, M = Maceration, PU = Pre-ulcerative lesion F = Fissure, S = Swelling, D = Dryness Assessment Right: Left: Other Deformity: No  No Prior Foot Ulcer: No No Prior Amputation: No No Charcot Joint: No No Ambulatory Status: Ambulatory With Help Assistance Device: Walker Gait: Steady Electronic Signature(s) Signed: 05/26/2021 11:06:31 AM By: Donnamarie Poag Entered By: Donnamarie Poag on 05/26/2021 10:44:28 Assad, [name] ([id number]) -------------------------------------------------------------------------------- Nutrition Risk Screening Details Patient Name: Vetere, [name] Date of Service: 05/26/2021 10:00 AM Medical Record Number: [id number] Patient Account Number: [id number] Date of Birth/Sex: [birth year] (69 y.o. Female) Treating RN: Donnamarie Poag Primary Care Klint Lezcano: Thereasa Distance Other Clinician: Referring Anjeanette Petzold: Thereasa Distance Treating Jodeen Mclin/Extender: Skipper Cliche in Treatment: 0 Height (in): 64 Weight (lbs): 207 Body Mass Index (BMI): 35.5 Nutrition Risk Screening Items Score Screening NUTRITION RISK SCREEN: I have an illness or condition that made me change the kind and/or amount of food I eat 0 No I eat fewer than two meals per day 0 No I eat few fruits and vegetables, or milk products 0 No I have three or more drinks of beer, liquor or wine almost every day 0 No I have tooth or mouth problems that make it hard for me to eat 0 No I don't always have enough money to buy the food I need 0 No I eat alone most of the time 0 No I take three or more different prescribed or over-the-counter drugs a day 1 Yes Without wanting to, I have lost or gained 10 pounds in the last six months 0 No I am not always physically able to shop, cook and/or feed myself 0 No Nutrition Protocols Good Risk Protocol 0 No interventions needed Moderate Risk Protocol High Risk Proctocol Risk Level: Good Risk Score: 1 Electronic Signature(s) Signed: 05/26/2021 11:06:31 AM By: Donnamarie Poag Entered ByDonnamarie Poag on 05/26/2021 10:42:30

## 2021-05-27 NOTE — Progress Notes (Addendum)
[name], [name] ([id number]) Visit Report for 05/26/2021 Chief Complaint Document Details Patient Name: [name]. Date of Service: 05/26/2021 10:00 AM Medical Record Number: [id number] Patient Account Number: [id number] Date of Birth/Sex: October 07, 1951 (69 y.o. F) Treating RN: Dolan Amen Primary Care Provider: Thereasa Distance Other Clinician: Referring Provider: Thereasa Distance Treating Provider/Extender: Skipper Cliche in Treatment: 0 Information Obtained from: Patient Chief Complaint Right LE Ulcer Electronic Signature(s) Signed: 05/26/2021 11:00:10 AM By: Worthy Keeler PA-C Entered By: Worthy Keeler on 05/26/2021 11:00:10 [name], [name] ([id number]) -------------------------------------------------------------------------------- HPI Details Patient Name: [name] Date of Service: 05/26/2021 10:00 AM Medical Record Number: [id number] Patient Account Number: [id number] Date of Birth/Sex: Jul 31, 1951 (69 y.o. F) Treating RN: Dolan Amen Primary Care Provider: Thereasa Distance Other Clinician: Referring Provider: Thereasa Distance Treating Provider/Extender: Skipper Cliche in Treatment: 0 History of Present Illness HPI Description: 05/26/2021 upon evaluation today patient appears to be doing somewhat poorly in regard to wounds that she is having currently on her lower extremities. With that being said the main issue that I see simply is that she has a lot of edema noted currently. She has seen Dr. Delana Meyer she has an appointment with him within the month as well she tells me. Nonetheless her wounds are dramatically better compared to what they were just a week ago. I saw pictures and to be honest this is a whole lot better. With that being said she was stated to have had cellulitis as well although I do not see anything right now but seems to be an issue that was treated back in March. Currently they have been using Ace wraps on her legs which actually seem to be  doing quite well along with a nonstick pad. With that being said she does have quite a few other issues including some chronic kidney disease, chronic atrial fibrillation for which she is on Eliquis, hypertension, sleep apnea and she is not currently being treated for this though she states she is supposed to be, and chronic venous insufficiency along with lymphedema. Electronic Signature(s) Signed: 05/26/2021 5:20:21 PM By: Worthy Keeler PA-C Entered By: Worthy Keeler on 05/26/2021 17:20:20 [name], [name] ([id number]) -------------------------------------------------------------------------------- Callus Pairing Details Patient Name: [name], [name] Date of Service: 05/26/2021 10:00 AM Medical Record Number: [id number] Patient Account Number: [id number] Date of Birth/Sex: [birth year] (69 y.o. F) Treating RN: Dolan Amen Primary Care Provider: Thereasa Distance Other Clinician: Referring Provider: Thereasa Distance Treating Provider/Extender: Skipper Cliche in Treatment: 0 Procedure Performed for: Non-Wound Location Performed By: Physician Tommie Sams., PA-C Post Procedure Diagnosis Same as Pre-procedure Notes Patient has a significant callus buildup of the Right plantar foot region which is causing pain with walking. I used a #3 Curette to clear this away today. She had no bleeding noted with the procedure but was able to walk without pain following. Electronic Signature(s) Signed: 07/07/2021 8:48:03 AM By: Worthy Keeler PA-C Entered By: Worthy Keeler on 07/07/2021 08:48:02 [name], [name] ([id number]) -------------------------------------------------------------------------------- Physical Exam Details Patient Name: [name], [name] Date of Service: 05/26/2021 10:00 AM Medical Record Number: [id number] Patient Account Number: [id number] Date of Birth/Sex: 02-Aug-1951 (69 y.o. F) Treating RN: Dolan Amen Primary Care Provider: Thereasa Distance Other Clinician: Referring  Provider: Thereasa Distance Treating Provider/Extender: Jeri Cos Weeks in Treatment: 0 Constitutional Well-nourished and well-hydrated in no acute distress. Eyes conjunctiva clear no eyelid edema noted. pupils equal round and reactive to light and accommodation. Ears, Nose, Mouth, and Throat no gross  abnormality of ear auricles or external auditory canals. normal hearing noted during conversation. mucus membranes moist. Respiratory normal breathing without difficulty. Cardiovascular 1+ dorsalis pedis/posterior tibialis pulses. 2+ pitting edema of the bilateral lower extremities. Psychiatric this patient is able to make decisions and demonstrates good insight into disease process. Alert and Oriented x 3. pleasant and cooperative. Notes Upon inspection patient's wound bed actually showed signs of good granulation epithelization at this point. I do not see any signs of active infection at this time which is great news. Overall very pleased with where things stand currently. She does have significant edema of the lower extremities but the good news is there does not appear to be any signs of active infection at this time which is great news. No fevers, chills, nausea, vomiting, or diarrhea. Electronic Signature(s) Signed: 05/26/2021 5:29:08 PM By: Worthy Keeler PA-C Entered By: Worthy Keeler on 05/26/2021 17:29:08 [name], [name] ([id number]) -------------------------------------------------------------------------------- Physician Orders Details Patient Name: [name], [name] Date of Service: 05/26/2021 10:00 AM Medical Record Number: [id number] Patient Account Number: [id number] Date of Birth/Sex: 09/24/1951 (69 y.o. F) Treating RN: Dolan Amen Primary Care Provider: Thereasa Distance Other Clinician: Referring Provider: Thereasa Distance Treating Provider/Extender: Skipper Cliche in Treatment: 0 Verbal / Phone Orders: No Diagnosis Coding ICD-10 Coding Code Description I87.2  Venous insufficiency (chronic) (peripheral) I89.0 Lymphedema, not elsewhere classified L97.812 Non-pressure chronic ulcer of other part of right lower leg with fat layer exposed I48.0 Paroxysmal atrial fibrillation Z79.01 Long term (current) use of anticoagulants G47.30 Sleep apnea, unspecified I10 Essential (primary) hypertension L84 Corns and callosities Follow-up Appointments o Return Appointment in 1 week. o Nurse Visit as needed [place]: [facility] for wound care. May utilize formulary equivalent dressing for wound treatment orders unless otherwise specified. Home Health Nurse may visit PRN to address patientos wound care needs. o Scheduled days for dressing changes to be completed; exception, patient has scheduled wound care visit that day. o **Please direct any NON-WOUND related issues/requests for orders to patient's Primary Care Physician. **If current dressing causes regression in wound condition, may D/C ordered dressing product/s and apply Normal Saline Moist Dressing daily until next [place] or Other MD appointment. **Notify [facility] of regression in wound condition at [phone]. Bathing/ Shower/ Hygiene o May shower with wound dressing protected with water repellent cover or cast protector. o No tub bath. Edema Control - Lymphedema / Segmental Compressive Device / Other o Ace wraps o Elevate legs to the level of the heart and pump ankles as often as possible o Elevate leg(s) parallel to the floor when sitting. o DO YOUR BEST to sleep in the bed at night. DO NOT sleep in your recliner. Long hours of sitting in a recliner leads to swelling of the legs and/or potential wounds on your backside. Wound Treatment Wound #1 - Lower Leg Wound Laterality: Right Cleanser: Soap and Water 1 x Per Day/15 Days Discharge Instructions: Gently cleanse wound with antibacterial soap, rinse  and pat dry prior to dressing wounds Primary Dressing: Xeroform-HBD 2x2 (in/in) 1 x Per Day/15 Days Discharge Instructions: Apply Xeroform-HBD 2x2 (in/in) as directed Secondary Dressing: ABD Pad 5x9 (in/in) 1 x Per Day/15 Days Discharge Instructions: Cover with ABD pad Secured With: 6M ACE Elastic Bandage With VELCRO Brand Closure, 4 (in) 1 x Per Day/15 Days Discharge Instructions: Secure kerlix from base of foot to right below knee Secured With: Seth Ward  Surgical Tape, 2x2 (in/yd) 1 x Per [id number] Days Discharge Instructions: Secure kerlix Secured With: Kerlix Roll Sterile or Non-Sterile 6-ply 4.5x4 (yd/yd) 1 x Per Day/15 Days [name], [name] ([id number]) Discharge Instructions: Apply Kerlix as directed Electronic Signature(s) Signed: 05/26/2021 4:29:08 PM By: Charlett Nose RN Signed: 05/26/2021 5:38:58 PM By: Worthy Keeler PA-C Entered By: Georges Mouse, Minus Breeding on 05/26/2021 11:48:54 [name], [name] ([id number]) -------------------------------------------------------------------------------- Problem List Details Patient Name: [name] Date of Service: 05/26/2021 10:00 AM Medical Record Number: [id number] Patient Account Number: [id number] Date of Birth/Sex: [birth year] (69 y.o. F) Treating RN: Dolan Amen Primary Care Provider: Thereasa Distance Other Clinician: Referring Provider: Thereasa Distance Treating Provider/Extender: Skipper Cliche in Treatment: 0 Active Problems ICD-10 Encounter Code Description Active Date MDM Diagnosis I87.2 Venous insufficiency (chronic) (peripheral) 05/26/2021 No Yes I89.0 Lymphedema, not elsewhere classified 05/26/2021 No Yes L97.812 Non-pressure chronic ulcer of other part of right lower leg with fat layer 05/26/2021 No Yes exposed I48.0 Paroxysmal atrial fibrillation 05/26/2021 No Yes Z79.01 Long term (current) use of anticoagulants 05/26/2021 No Yes G47.30 Sleep apnea, unspecified 05/26/2021 No Yes I10 Essential (primary)  hypertension 05/26/2021 No Yes L84 Corns and callosities 05/26/2021 No Yes Inactive Problems Resolved Problems Electronic Signature(s) Signed: 05/26/2021 4:29:08 PM By: Georges Mouse, Minus Breeding RN Signed: 05/26/2021 5:38:58 PM By: Worthy Keeler PA-C Previous Signature: 05/26/2021 10:59:50 AM Version By: Worthy Keeler PA-C Previous Signature: 05/26/2021 10:59:30 AM Version By: Worthy Keeler PA-C Entered By: Georges Mouse, Minus Breeding on 05/26/2021 11:35:15 [name], [name] ([id number]) -------------------------------------------------------------------------------- Progress Note Details Patient Name: [name], [name] Date of Service: 05/26/2021 10:00 AM Medical Record Number: [id number] Patient Account Number: [id number] Date of Birth/Sex: [birth year] (69 y.o. F) Treating RN: Dolan Amen Primary Care Provider: Thereasa Distance Other Clinician: Referring Provider: Thereasa Distance Treating Provider/Extender: Skipper Cliche in Treatment: 0 Subjective Chief Complaint Information obtained from Patient Right LE Ulcer History of Present Illness (HPI) 05/26/2021 upon evaluation today patient appears to be doing somewhat poorly in regard to wounds that she is having currently on her lower extremities. With that being said the main issue that I see simply is that she has a lot of edema noted currently. She has seen Dr. Delana Meyer she has an appointment with him within the month as well she tells me. Nonetheless her wounds are dramatically better compared to what they were just a week ago. I saw pictures and to be honest this is a whole lot better. With that being said she was stated to have had cellulitis as well although I do not see anything right now but seems to be an issue that was treated back in March. Currently they have been using Ace wraps on her legs which actually seem to be doing quite well along with a nonstick pad. With that being said she does have quite a few other issues including some  chronic kidney disease, chronic atrial fibrillation for which she is on Eliquis, hypertension, sleep apnea and she is not currently being treated for this though she states she is supposed to be, and chronic venous insufficiency along with lymphedema. Patient History Information obtained from Patient. Allergies Augmentin (Severity: Moderate, Reaction: rash), Sulfa (Sulfonamide Antibiotics) (Severity: Moderate, Reaction: itching) Social History Never smoker, Marital Status - Divorced, Alcohol Use - Never, Drug Use - No History, Caffeine Use - Rarely. Medical History Eyes Patient has history of Cataracts Hematologic/Lymphatic Patient has history of Anemia Respiratory Patient has history of Asthma, Sleep Apnea Cardiovascular Patient has history  of Arrhythmia - afib, Coronary Artery Disease, Hypertension Integumentary (Skin) Patient has history of History of pressure wounds Musculoskeletal Patient has history of Rheumatoid Arthritis, Osteoarthritis Hospitalization/Surgery History - left foot surgery. - cholecystectomy. - cesarean. Review of Systems (ROS) Constitutional Symptoms (General Health) Denies complaints or symptoms of Fatigue, Fever, Chills, Marked Weight Change. Eyes Complains or has symptoms of Glasses / Contacts. Ear/Nose/Mouth/Throat Complains or has symptoms of Sinusitis. Respiratory Complains or has symptoms of Shortness of Breath. Cardiovascular Complains or has symptoms of LE edema. Gastrointestinal Complains or has symptoms of Nausea, GERD Endocrine Denies complaints or symptoms of Hepatitis, Thyroid disease, Polydypsia (Excessive Thirst). Genitourinary Denies complaints or symptoms of Kidney failure/ Dialysis, Incontinence/dribbling. Immunological Complains or has symptoms of Itching. Integumentary (Skin) Complains or has symptoms of Swelling. Musculoskeletal [place]rod, [place] ([id number]) Complains or has symptoms of Muscle Pain, Muscle  Weakness. Neurologic Denies complaints or symptoms of Numbness/parasthesias, Focal/Weakness. Psychiatric Complains or has symptoms of Anxiety. Objective Constitutional Well-nourished and well-hydrated in no acute distress. Vitals Time Taken: 10:25 AM, Height: 64 in, Source: Stated, Weight: 207 lbs, Source: Stated, BMI: 35.5. General Notes: unable to stand on scale Eyes conjunctiva clear no eyelid edema noted. pupils equal round and reactive to light and accommodation. Ears, Nose, Mouth, and Throat no gross abnormality of ear auricles or external auditory canals. normal hearing noted during conversation. mucus membranes moist. Respiratory normal breathing without difficulty. Cardiovascular 1+ dorsalis pedis/posterior tibialis pulses. 2+ pitting edema of the bilateral lower extremities. Psychiatric this patient is able to make decisions and demonstrates good insight into disease process. Alert and Oriented x 3. pleasant and cooperative. General Notes: Upon inspection patient's wound bed actually showed signs of good granulation epithelization at this point. I do not see any signs of active infection at this time which is great news. Overall very pleased with where things stand currently. She does have significant edema of the lower extremities but the good news is there does not appear to be any signs of active infection at this time which is great news. No fevers, chills, nausea, vomiting, or diarrhea. Integumentary (Hair, Skin) Wound #1 status is Open. Original cause of wound was Gradually Appeared. The date acquired was: 04/20/2021. The wound is located on the Right Lower Leg. The wound measures 1cm length x 0.7cm width x 0.1cm depth; 0.55cm^2 area and 0.055cm^3 volume. There is Fat Layer (Subcutaneous Tissue) exposed. There is no tunneling or undermining noted. There is a medium amount of serosanguineous drainage noted. There is large (67-100%) red, pink, pale granulation within the wound  bed. There is a small (1-33%) amount of necrotic tissue within the wound bed including Adherent Slough. Assessment Active Problems ICD-10 Venous insufficiency (chronic) (peripheral) Lymphedema, not elsewhere classified Non-pressure chronic ulcer of other part of right lower leg with fat layer exposed Paroxysmal atrial fibrillation Long term (current) use of anticoagulants Sleep apnea, unspecified Essential (primary) hypertension Corns and callosities Procedures A Callus Pairing procedure was performed. by Tommie Sams., PA-C. [place], [name] ([id number]) Post procedure Diagnosis Wound #: Same as Pre-Procedure Notes: Patient has a significant callus buildup of the Right plantar foot region which is causing pain with walking. I used a #3 Curette to clear this away today. She had no bleeding noted with the procedure but was able to walk without pain following. Plan Follow-up Appointments: Return Appointment in 1 week. Nurse Visit as needed Home Health: [facility] for wound care. May utilize formulary equivalent dressing for wound  treatment orders unless otherwise specified. Home Health Nurse may visit PRN to address patient s wound care needs. Scheduled days for dressing changes to be completed; exception, patient has scheduled wound care visit that day. **Please direct any NON-WOUND related issues/requests for orders to patient's Primary Care Physician. **If current dressing causes regression in wound condition, may D/C ordered dressing product/s and apply Normal Saline Moist Dressing daily until next [place] or Other MD appointment. **Notify [facility] Center of regression in wound condition at [phone]. Bathing/ Shower/ Hygiene: May shower with wound dressing protected with water repellent cover or cast protector. No tub bath. Edema Control - Lymphedema / Segmental Compressive Device / Other: Ace wraps Elevate legs to  the level of the heart and pump ankles as often as possible Elevate leg(s) parallel to the floor when sitting. DO YOUR BEST to sleep in the bed at night. DO NOT sleep in your recliner. Long hours of sitting in a recliner leads to swelling of the legs and/or potential wounds on your backside. WOUND #1: - Lower Leg Wound Laterality: Right Cleanser: Soap and Water 1 x Per Day/15 Days Discharge Instructions: Gently cleanse wound with antibacterial soap, rinse and pat dry prior to dressing wounds Primary Dressing: Xeroform-HBD 2x2 (in/in) 1 x Per Day/15 Days Discharge Instructions: Apply Xeroform-HBD 2x2 (in/in) as directed Secondary Dressing: ABD Pad 5x9 (in/in) 1 x Per Day/15 Days Discharge Instructions: Cover with ABD pad Secured With: 71M ACE Elastic Bandage With VELCRO Brand Closure, 4 (in) 1 x Per Day/15 Days Discharge Instructions: Secure kerlix from base of foot to right below knee Secured With: 71M Medipore H Soft Cloth Surgical Tape, 2x2 (in/yd) 1 x Per Day/15 Days Discharge Instructions: Secure kerlix Secured With: Kerlix Roll Sterile or Non-Sterile 6-ply 4.5x4 (yd/yd) 1 x Per Day/15 Days Discharge Instructions: Apply Kerlix as directed 1. Would recommend currently that we actually going continue with the wound care measures as before and the patient is in agreement with plan this includes the use of the Ace wrap which I think is done well for her. 2. I am also can recommend that we use for the dressing Xeroform gauze followed by an ABD pad to keep anything from sticking. 3. I would also suggest she continue to follow-up with Dr. Delana Meyer for her normal appointment in the month obviously I think that is something that she needs to continue to keep up with in my opinion. We will see patient back for reevaluation in 1 week here in the clinic. If anything worsens or changes patient will contact our office for additional recommendations. Electronic Signature(s) Signed: 07/07/2021 8:48:31 AM  By: Worthy Keeler PA-C Previous Signature: 05/26/2021 5:30:00 PM Version By: Worthy Keeler PA-C Entered By: Worthy Keeler on 07/07/2021 08:48:31 [name], [name] ([id number]) -------------------------------------------------------------------------------- ROS/PFSH Details Patient Name: [name] Date of Service: 05/26/2021 10:00 AM Medical Record Number: [id number] Patient Account Number: [id number] Date of Birth/Sex: 09/12/1951 (69 y.o. F) Treating RN: Donnamarie Poag Primary Care Provider: Thereasa Distance Other Clinician: Referring Provider: Thereasa Distance Treating Provider/Extender: Skipper Cliche in Treatment: 0 Information Obtained From Patient Constitutional Symptoms (General Health) Complaints and Symptoms: Negative for: Fatigue; Fever; Chills; Marked Weight Change Eyes Complaints and Symptoms: Positive for: Glasses / Contacts Medical History: Positive for: Cataracts Ear/Nose/Mouth/Throat Complaints and Symptoms: Positive for: Sinusitis Respiratory Complaints and Symptoms: Positive for: Shortness of Breath Medical History: Positive for: Asthma; Sleep Apnea Cardiovascular Complaints and Symptoms: Positive for: LE edema Medical History: Positive for: Arrhythmia -  afib; Coronary Artery Disease; Hypertension Gastrointestinal Complaints and Symptoms: Positive for: Nausea Review of System Notes: GERD Endocrine Complaints and Symptoms: Negative for: Hepatitis; Thyroid disease; Polydypsia (Excessive Thirst) Genitourinary Complaints and Symptoms: Negative for: Kidney failure/ Dialysis; Incontinence/dribbling Immunological Complaints and Symptoms: Positive for: Itching Hinchliffe, Aanvi W. ([id number]) Integumentary (Skin) Complaints and Symptoms: Positive for: Swelling Medical History: Positive for: History of pressure wounds Musculoskeletal Complaints and Symptoms: Positive for: Muscle Pain; Muscle Weakness Medical History: Positive for: Rheumatoid  Arthritis; Osteoarthritis Neurologic Complaints and Symptoms: Negative for: Numbness/parasthesias; Focal/Weakness Psychiatric Complaints and Symptoms: Positive for: Anxiety Hematologic/Lymphatic Medical History: Positive for: Anemia Oncologic HBO Extended History Items Eyes: Cataracts Immunizations Pneumococcal Vaccine: Received Pneumococcal Vaccination: Yes Implantable Devices None Hospitalization / Surgery History Type of Hospitalization/Surgery left foot surgery cholecystectomy cesarean Family and Social History Never smoker; Marital Status - Divorced; Alcohol Use: Never; Drug Use: No History; Caffeine Use: Rarely; Financial Concerns: No; Food, Clothing or Shelter Needs: No; Support System Lacking: No; Transportation Concerns: No Electronic Signature(s) Signed: 05/26/2021 11:06:31 AM By: Donnamarie Poag Signed: 05/26/2021 5:38:58 PM By: Worthy Keeler PA-C Entered By: Donnamarie Poag on 05/26/2021 10:39:03 [place], [name] ([id number]) -------------------------------------------------------------------------------- SuperBill Details Patient Name: [place], [name] Date of Service: 05/26/2021 Medical Record Number: [id number] Patient Account Number: [id number] Date of Birth/Sex: [birth year] (69 y.o. F) Treating RN: Dolan Amen Primary Care Provider: Thereasa Distance Other Clinician: Referring Provider: Thereasa Distance Treating Provider/Extender: Skipper Cliche in Treatment: 0 Diagnosis Coding ICD-10 Codes Code Description I87.2 Venous insufficiency (chronic) (peripheral) I89.0 Lymphedema, not elsewhere classified L97.812 Non-pressure chronic ulcer of other part of right lower leg with fat layer exposed I48.0 Paroxysmal atrial fibrillation Z79.01 Long term (current) use of anticoagulants G47.30 Sleep apnea, unspecified I10 Essential (primary) hypertension L84 Corns and callosities Facility Procedures CPT4 Code: [id number] Description: [id number] - WOUND CARE VISIT-LEV 2 EST  PT Modifier: Quantity: 1 CPT4 Code: [id number] Description: [id number] - PARE BENIGN LES; SGL Modifier: Quantity: 1 CPT4 Code: Description: ICD-10 Diagnosis Description L84 Corns and callosities Modifier: Quantity: Physician Procedures CPT4 [id number] Description: WC PHYS LEVEL 3 o NEW PT Modifier: 25 Quantity: 1 CPT4 Code: Description: ICD-10 Diagnosis Description I87.2 Venous insufficiency (chronic) (peripheral) I89.0 Lymphedema, not elsewhere classified L97.812 Non-pressure chronic ulcer of other part of right lower leg with fat laye L84 Corns and callosities Modifier: r exposed Quantity: CPT4 Code: [id number] Description: [id number] - WC PHYS PARE BENIGN LES; SGL Modifier: Quantity: 1 CPT4 Code: Description: ICD-10 Diagnosis Description L84 Corns and callosities Modifier: Quantity: Electronic Signature(s) Signed: 07/07/2021 8:49:29 AM By: Worthy Keeler PA-C Previous Signature: 05/26/2021 5:30:21 PM Version By: Worthy Keeler PA-C Previous Signature: 05/26/2021 4:29:08 PM Version By: Georges Mouse, Minus Breeding RN Entered By: Worthy Keeler on 07/07/2021 08:49:29

## 2021-06-02 ENCOUNTER — Encounter: Payer: Medicare Other | Admitting: Physician Assistant

## 2021-06-02 ENCOUNTER — Other Ambulatory Visit: Payer: Self-pay

## 2021-06-02 DIAGNOSIS — L97812 Non-pressure chronic ulcer of other part of right lower leg with fat layer exposed: Secondary | ICD-10-CM | POA: Diagnosis not present

## 2021-06-02 NOTE — Progress Notes (Addendum)
Conaty, Candy [name] ([id number]) Visit Report for 06/02/2021 Chief Complaint Document Details Patient Name: [name], [name] Date of Service: 06/02/2021 10:15 AM Medical Record Number: [id number] Patient Account Number: [id number] Date of Birth/Sex: 1951/11/27 (70 y.o. F) Treating RN: Dolan Amen Primary Care Provider: Thereasa Distance Other Clinician: Referring Provider: Thereasa Distance Treating Provider/Extender: Skipper Cliche in Treatment: 1 Information Obtained from: Patient Chief Complaint Right LE Ulcer Electronic Signature(s) Signed: 06/02/2021 10:20:49 AM By: Worthy Keeler PA-C Entered By: Worthy Keeler on 06/02/2021 10:20:49 Loera, Candy [name] ([id number]) -------------------------------------------------------------------------------- HPI Details Patient Name: [name] Date of Service: 06/02/2021 10:15 AM Medical Record Number: [id number] Patient Account Number: [id number] Date of Birth/Sex: 1951/03/07 (69 y.o. F) Treating RN: Dolan Amen Primary Care Provider: Thereasa Distance Other Clinician: Referring Provider: Thereasa Distance Treating Provider/Extender: Skipper Cliche in Treatment: 1 History of Present Illness HPI Description: 05/26/2021 upon evaluation today patient appears to be doing somewhat poorly in regard to wounds that she is having currently on her lower extremities. With that being said the main issue that I see simply is that she has a lot of edema noted currently. She has seen Dr. Delana Meyer she has an appointment with him within the month as well she tells me. Nonetheless her wounds are dramatically better compared to what they were just a week ago. I saw pictures and to be honest this is a whole lot better. With that being said she was stated to have had cellulitis as well although I do not see anything right now but seems to be an issue that was treated back in March. Currently they have been using Ace wraps on her legs which actually seem to be  doing quite well along with a nonstick pad. With that being said she does have quite a few other issues including some chronic kidney disease, chronic atrial fibrillation for which she is on Eliquis, hypertension, sleep apnea and she is not currently being treated for this though she states she is supposed to be, and chronic venous insufficiency along with lymphedema. 06/02/2021 upon evaluation today patient appears to be doing better in regard to her wounds. She has been tolerating the dressing changes without complication. Fortunately there is no evidence of active infection at this time which is great news. No fevers, chills, nausea, vomiting, or diarrhea. Electronic Signature(s) Signed: 06/02/2021 2:23:17 PM By: Worthy Keeler PA-C Entered By: Worthy Keeler on 06/02/2021 14:23:16 Theroux, Candy [name] ([id number]) -------------------------------------------------------------------------------- Physical Exam Details Patient Name: [name] Date of Service: 06/02/2021 10:15 AM Medical Record Number: [id number] Patient Account Number: [id number] Date of Birth/Sex: 1951/06/24 (69 y.o. F) Treating RN: Dolan Amen Primary Care Provider: Thereasa Distance Other Clinician: Referring Provider: Thereasa Distance Treating Provider/Extender: Jeri Cos Weeks in Treatment: 1 Constitutional Well-nourished and well-hydrated in no acute distress. Respiratory normal breathing without difficulty. Psychiatric this patient is able to make decisions and demonstrates good insight into disease process. Alert and Oriented x 3. pleasant and cooperative. Notes Patient's wound bed showed signs of good granulation epithelization for the most part. She does have an area on the anterior portion of her left leg which is currently problematic but otherwise everything appears to be doing okay. Electronic Signature(s) Signed: 06/02/2021 2:23:46 PM By: Worthy Keeler PA-C Entered By: Worthy Keeler on 06/02/2021  14:23:46 Cerezo, Candy [name] ([id number]) -------------------------------------------------------------------------------- Physician Orders Details Patient Name: [name] Date of Service: 06/02/2021 10:15 AM Medical Record Number: [id number] Patient Account Number: [id number] Date of Birth/Sex:  [birth year] (70 y.o. F) Treating RN: Dolan Amen Primary Care Provider: Thereasa Distance Other Clinician: Referring Provider: Thereasa Distance Treating Provider/Extender: Skipper Cliche in Treatment: 1 Verbal / Phone Orders: No Diagnosis Coding ICD-10 Coding Code Description I87.2 Venous insufficiency (chronic) (peripheral) I89.0 Lymphedema, not elsewhere classified L97.812 Non-pressure chronic ulcer of other part of right lower leg with fat layer exposed I48.0 Paroxysmal atrial fibrillation Z79.01 Long term (current) use of anticoagulants G47.30 Sleep apnea, unspecified I10 Essential (primary) hypertension L84 Corns and callosities Follow-up Appointments o Return Appointment in 1 week. o Nurse Visit as needed [facility]: [facility] for wound care. May utilize formulary equivalent dressing for wound treatment orders unless otherwise specified. Home Health Nurse may visit PRN to address patientos wound care needs. o Scheduled days for dressing changes to be completed; exception, patient has scheduled wound care visit that day. o **Please direct any NON-WOUND related issues/requests for orders to patient's Primary Care Physician. **If current dressing causes regression in wound condition, may D/C ordered dressing product/s and apply Normal Saline Moist Dressing daily until next [place] or Other MD appointment. **Notify [facility] of regression in wound condition at [phone]. Bathing/ Shower/ Hygiene o May shower; gently cleanse wound with antibacterial soap, rinse and pat dry prior to dressing  wounds o No tub bath. Edema Control - Lymphedema / Segmental Compressive Device / Other o Patient to wear own compression stockings. Remove compression stockings every night before going to bed and put on every morning when getting up. - On right leg o Elevate legs to the level of the heart and pump ankles as often as possible o Elevate leg(s) parallel to the floor when sitting. o DO YOUR BEST to sleep in the bed at night. DO NOT sleep in your recliner. Long hours of sitting in a recliner leads to swelling of the legs and/or potential wounds on your backside. Wound Treatment Wound #2 - Lower Leg Wound Laterality: Left, Midline, Distal Cleanser: Soap and Water 3 x Per Week/15 Days Discharge Instructions: Gently cleanse wound with antibacterial soap, rinse and pat dry prior to dressing wounds Primary Dressing: Xeroform-HBD 2x2 (in/in) 3 x Per Week/15 Days Discharge Instructions: Apply Xeroform-HBD 2x2 (in/in) as directed Secondary Dressing: ABD Pad 5x9 (in/in) 3 x Per Week/15 Days Discharge Instructions: Cover with ABD pad Secured With: 21M Medipore H Soft Cloth Surgical Tape, 2x2 (in/yd) 3 x Per Week/15 Days Discharge Instructions: Secure kerlix Secured With: [employer] Sterile or Non-Sterile 6-ply 4.5x4 (yd/yd) 3 x Per Week/15 Days Discharge Instructions: Apply Kerlix as directed Manseau, Candy [name] ([id number]) Secured With: Tubigrip Size D, 3x10 (in/yd) 3 x Per Week/15 Days Discharge Instructions: Apply 3 Tubigrip D 3-finger-widths below knee to base of toes to secure dressing and/or for swelling (extra sent with patient) Electronic Signature(s) Signed: 06/02/2021 2:45:21 PM By: Georges Mouse, Minus Breeding RN Signed: 06/02/2021 4:01:31 PM By: Worthy Keeler PA-C Entered By: Georges Mouse, Minus Breeding on 06/02/2021 11:13:41 Hurley, Candy [name] ([id number]) -------------------------------------------------------------------------------- Problem List Details Patient Name: Seel, Candy [name] Date of Service: 06/02/2021 10:15 AM Medical Record Number: [id number] Patient Account Number: [id number] Date of Birth/Sex: 1951/04/12 (69 y.o. F) Treating RN: Dolan Amen Primary Care Provider: Thereasa Distance Other Clinician: Referring Provider: Thereasa Distance Treating Provider/Extender: Skipper Cliche in Treatment: 1 Active Problems ICD-10 Encounter Code Description Active Date MDM Diagnosis I87.2 Venous insufficiency (chronic) (peripheral) 05/26/2021 No Yes I89.0 Lymphedema, not elsewhere classified 05/26/2021 No Yes L97.812  Non-pressure chronic ulcer of other part of right lower leg with fat layer 05/26/2021 No Yes exposed I48.0 Paroxysmal atrial fibrillation 05/26/2021 No Yes Z79.01 Long term (current) use of anticoagulants 05/26/2021 No Yes G47.30 Sleep apnea, unspecified 05/26/2021 No Yes I10 Essential (primary) hypertension 05/26/2021 No Yes L84 Corns and callosities 05/26/2021 No Yes Inactive Problems Resolved Problems Electronic Signature(s) Signed: 06/02/2021 10:20:44 AM By: Worthy Keeler PA-C Entered By: Worthy Keeler on 06/02/2021 10:20:43 [place], Candy [name] ([id number]) -------------------------------------------------------------------------------- Progress Note Details Patient Name: Stockburger, Candy [name] Date of Service: 06/02/2021 10:15 AM Medical Record Number: [id number] Patient Account Number: [id number] Date of Birth/Sex: 02-14-1951 (69 y.o. F) Treating RN: Dolan Amen Primary Care Provider: Thereasa Distance Other Clinician: Referring Provider: Thereasa Distance Treating Provider/Extender: Skipper Cliche in Treatment: 1 Subjective Chief Complaint Information obtained from Patient Right LE Ulcer History of Present Illness (HPI) 05/26/2021 upon evaluation today patient appears to be doing somewhat poorly in regard to wounds that she is having currently on her lower extremities. With that being said the main issue that I see simply is that she has a lot of edema  noted currently. She has seen Dr. Delana Meyer she has an appointment with him within the month as well she tells me. Nonetheless her wounds are dramatically better compared to what they were just a week ago. I saw pictures and to be honest this is a whole lot better. With that being said she was stated to have had cellulitis as well although I do not see anything right now but seems to be an issue that was treated back in March. Currently they have been using Ace wraps on her legs which actually seem to be doing quite well along with a nonstick pad. With that being said she does have quite a few other issues including some chronic kidney disease, chronic atrial fibrillation for which she is on Eliquis, hypertension, sleep apnea and she is not currently being treated for this though she states she is supposed to be, and chronic venous insufficiency along with lymphedema. 06/02/2021 upon evaluation today patient appears to be doing better in regard to her wounds. She has been tolerating the dressing changes without complication. Fortunately there is no evidence of active infection at this time which is great news. No fevers, chills, nausea, vomiting, or diarrhea. Objective Constitutional Well-nourished and well-hydrated in no acute distress. Vitals Time Taken: 10:23 AM, Height: 64 in, Weight: 207 lbs, BMI: 35.5, Temperature: 98 F, Pulse: 73 bpm, Respiratory Rate: 20 breaths/min, Blood Pressure: 102/57 mmHg. Respiratory normal breathing without difficulty. Psychiatric this patient is able to make decisions and demonstrates good insight into disease process. Alert and Oriented x 3. pleasant and cooperative. General Notes: Patient's wound bed showed signs of good granulation epithelization for the most part. She does have an area on the anterior portion of her left leg which is currently problematic but otherwise everything appears to be doing okay. Integumentary (Hair, Skin) Wound #1 status is Healed  - Epithelialized. Original cause of wound was Gradually Appeared. The date acquired was: 04/20/2021. The wound has been in treatment 1 weeks. The wound is located on the Right Lower Leg. The wound measures 0cm length x 0cm width x 0cm depth; 0cm^2 area and 0cm^3 volume. There is no tunneling or undermining noted. There is a none present amount of drainage noted. There is no granulation within the wound bed. There is no necrotic tissue within the wound bed. Wound #2 status is Open. Original cause of wound  was Blister. The date acquired was: 05/31/2021. The wound is located on the Left,Distal,Midline Lower Leg. The wound measures 1cm length x 1cm width x 0.1cm depth; 0.785cm^2 area and 0.079cm^3 volume. There is Fat Layer (Subcutaneous Tissue) exposed. There is no tunneling or undermining noted. There is a medium amount of serosanguineous drainage noted. There is large (67-100%) red, pink granulation within the wound bed. Avallone, Candy [name] ([id number]) Assessment Active Problems ICD-10 Venous insufficiency (chronic) (peripheral) Lymphedema, not elsewhere classified Non-pressure chronic ulcer of other part of right lower leg with fat layer exposed Paroxysmal atrial fibrillation Long term (current) use of anticoagulants Sleep apnea, unspecified Essential (primary) hypertension Corns and callosities Plan Follow-up Appointments: Return Appointment in 1 week. Nurse Visit as needed Home Health: [place] for wound care. May utilize formulary equivalent dressing for wound treatment orders unless otherwise specified. Home Health Nurse may visit PRN to address patient s wound care needs. Scheduled days for dressing changes to be completed; exception, patient has scheduled wound care visit that day. **Please direct any NON-WOUND related issues/requests for orders to patient's Primary Care Physician. **If current dressing causes regression in wound condition,  may D/C ordered dressing product/s and apply Normal Saline Moist Dressing daily until next [place] or Other MD appointment. **Notify [facility] of regression in wound condition at [phone]. Bathing/ Shower/ Hygiene: May shower; gently cleanse wound with antibacterial soap, rinse and pat dry prior to dressing wounds No tub bath. Edema Control - Lymphedema / Segmental Compressive Device / Other: Patient to wear own compression stockings. Remove compression stockings every night before going to bed and put on every morning when getting up. - On right leg Elevate legs to the level of the heart and pump ankles as often as possible Elevate leg(s) parallel to the floor when sitting. DO YOUR BEST to sleep in the bed at night. DO NOT sleep in your recliner. Long hours of sitting in a recliner leads to swelling of the legs and/or potential wounds on your backside. WOUND #2: - Lower Leg Wound Laterality: Left, Midline, Distal Cleanser: Soap and Water 3 x Per Week/15 Days Discharge Instructions: Gently cleanse wound with antibacterial soap, rinse and pat dry prior to dressing wounds Primary Dressing: Xeroform-HBD 2x2 (in/in) 3 x Per Week/15 Days Discharge Instructions: Apply Xeroform-HBD 2x2 (in/in) as directed Secondary Dressing: ABD Pad 5x9 (in/in) 3 x Per Week/15 Days Discharge Instructions: Cover with ABD pad Secured With: 79M Medipore H Soft Cloth Surgical Tape, 2x2 (in/yd) 3 x Per Week/15 Days Discharge Instructions: Secure kerlix Secured With: [employer] Sterile or Non-Sterile 6-ply 4.5x4 (yd/yd) 3 x Per Week/15 Days Discharge Instructions: Apply Kerlix as directed Secured With: Tubigrip Size D, 3x10 (in/yd) 3 x Per Week/15 Days Discharge Instructions: Apply 3 Tubigrip D 3-finger-widths below knee to base of toes to secure dressing and/or for swelling (extra sent with patient) 1. Would recommend currently that we going continue with wound care measures as before and  the patient is in agreement with the plan this includes the use of the Xeroform gauze dressing followed by the ABD pad to cover. 2. Also can continue with the Curlex followed by Tubigrip. 3. I am also can recommend the patient should continue to elevate her legs as much as possible I think that still appropriate. I am hoping the Tubigrip will do better for unlike the Ace wrap which actually cause more irritation and upon sliding down was giving her more trouble. We  will see patient back for reevaluation in 1 week here in the clinic. If anything worsens or changes patient will contact our office for additional recommendations. Electronic Signature(s) Signed: 06/02/2021 2:24:27 PM By: Worthy Keeler PA-C Entered By: Worthy Keeler on 06/02/2021 14:24:27 [place], Candy [name] ([id number]) -------------------------------------------------------------------------------- SuperBill Details Patient Name: Provencio, Candy [name] Date of Service: 06/02/2021 Medical Record Number: [id number] Patient Account Number: [id number] Date of Birth/Sex: 1951-12-04 (69 y.o. F) Treating RN: Dolan Amen Primary Care Provider: Thereasa Distance Other Clinician: Referring Provider: Thereasa Distance Treating Provider/Extender: Skipper Cliche in Treatment: 1 Diagnosis Coding ICD-10 Codes Code Description I87.2 Venous insufficiency (chronic) (peripheral) I89.0 Lymphedema, not elsewhere classified L97.812 Non-pressure chronic ulcer of other part of right lower leg with fat layer exposed I48.0 Paroxysmal atrial fibrillation Z79.01 Long term (current) use of anticoagulants G47.30 Sleep apnea, unspecified I10 Essential (primary) hypertension L84 Corns and callosities Facility Procedures CPT4 Code: [id number] Description: 99213 - WOUND CARE VISIT-LEV 3 EST PT Modifier: Quantity: 1 Physician Procedures CPT4 Code: [id number] Description: [id number] - WC PHYS LEVEL 3 - EST PT Modifier: Quantity: 1 CPT4 Code: Description: ICD-10  Diagnosis Description I87.2 Venous insufficiency (chronic) (peripheral) I89.0 Lymphedema, not elsewhere classified [id number] Non-pressure chronic ulcer of other part of right lower leg with fat la I48.0 Paroxysmal atrial fibrillation Modifier: yer exposed Quantity: Electronic Signature(s) Signed: 06/02/2021 2:24:42 PM By: Worthy Keeler PA-C Entered By: Worthy Keeler on 06/02/2021 14:24:42

## 2021-06-06 NOTE — Progress Notes (Addendum)
Brewbaker, [name] ([id number]) Visit Report for 06/02/2021 Arrival Information Details Patient Name: [name], [name]. Date of Service: 06/02/2021 10:15 AM Medical Record Number: [id number] Patient Account Number: [id number] Date of Birth/Sex: 09-25-1951 (70 y.o. F) Treating RN: Donnamarie Poag Primary Care Shirlee Whitmire: Thereasa Distance Other Clinician: Referring Natsha Guidry: Referral, Self Treating Slyvester Latona/Extender: Skipper Cliche in Treatment: 1 Visit Information History Since Last Visit Added or deleted any medications: No Patient Arrived: Wheel Chair Had a fall or experienced change in No Arrival Time: 10:18 activities of daily living that may affect Accompanied By: friend risk of falls: Transfer Assistance: EasyPivot Patient Lift Hospitalized since last visit: No Patient Identification Verified: Yes Has Dressing in Place as Prescribed: Yes Secondary Verification Process Completed: Yes Has Compression in Place as Prescribed: Yes Patient Has Alerts: Yes Pain Present Now: Yes Patient Alerts: Patient on Blood Thinner ELIQUIS ABI at vascular 03/17/21 ABI L-0.69 R-0.67 Electronic Signature(s) Signed: 06/06/2021 11:12:12 AM By: Donnamarie Poag Entered By: Donnamarie Poag on 06/02/2021 10:19:51 Jeziorski, [name] ([id number]) -------------------------------------------------------------------------------- Clinic Level of Care Assessment Details Patient Name: [name], [name] Date of Service: 06/02/2021 10:15 AM Medical Record Number: [id number] Patient Account Number: [id number] Date of Birth/Sex: 11-Feb-1951 (69 y.o. F) Treating RN: Dolan Amen Primary Care Deloris Moger: Thereasa Distance Other Clinician: Referring Unnamed Hino: Referral, Self Treating Tag Wurtz/Extender: Skipper Cliche in Treatment: 1 Clinic Level of Care Assessment Items TOOL 4 Quantity Score X - Use when only an EandM is performed on FOLLOW-UP visit 1 0 ASSESSMENTS - Nursing Assessment / Reassessment X - Reassessment of  Co-morbidities (includes updates in patient status) 1 10 X- 1 5 Reassessment of Adherence to Treatment Plan ASSESSMENTS - Wound and Skin Assessment / Reassessment X - Simple Wound Assessment / Reassessment - one wound 1 5 '[]'$  - 0 Complex Wound Assessment / Reassessment - multiple wounds '[]'$  - 0 Dermatologic / Skin Assessment (not related to wound area) ASSESSMENTS - Focused Assessment '[]'$  - Circumferential Edema Measurements - multi extremities 0 '[]'$  - 0 Nutritional Assessment / Counseling / Intervention '[]'$  - 0 Lower Extremity Assessment (monofilament, tuning fork, pulses) '[]'$  - 0 Peripheral Arterial Disease Assessment (using hand held doppler) ASSESSMENTS - Ostomy and/or Continence Assessment and Care '[]'$  - Incontinence Assessment and Management 0 '[]'$  - 0 Ostomy Care Assessment and Management (repouching, etc.) PROCESS - Coordination of Care X - Simple Patient / Family Education for ongoing care 1 15 '[]'$  - 0 Complex (extensive) Patient / Family Education for ongoing care '[]'$  - 0 Staff obtains Programmer, systems, Records, Test Results / Process Orders '[]'$  - 0 Staff telephones HHA, Nursing Homes / Clarify orders / etc '[]'$  - 0 Routine Transfer to another Facility (non-emergent condition) '[]'$  - 0 Routine Hospital Admission (non-emergent condition) '[]'$  - 0 New Admissions / Biomedical engineer / Ordering NPWT, Apligraf, etc. '[]'$  - 0 Emergency Hospital Admission (emergent condition) X- 1 10 Simple Discharge Coordination '[]'$  - 0 Complex (extensive) Discharge Coordination PROCESS - Special Needs '[]'$  - Pediatric / Minor Patient Management 0 '[]'$  - 0 Isolation Patient Management '[]'$  - 0 Hearing / Language / Visual special needs '[]'$  - 0 Assessment of Community assistance (transportation, D/C planning, etc.) '[]'$  - 0 Additional assistance / Altered mentation '[]'$  - 0 Support Surface(s) Assessment (bed, cushion, seat, etc.) INTERVENTIONS - Wound Cleansing / Measurement [name], [name] ([id number]) X- 1  5 Simple Wound Cleansing - one wound '[]'$  - 0 Complex Wound Cleansing - multiple wounds X- 1 5 Wound Imaging (photographs - any number of wounds) '[]'$  - 0  Wound Tracing (instead of photographs) X- 1 5 Simple Wound Measurement - one wound '[]'$  - 0 Complex Wound Measurement - multiple wounds INTERVENTIONS - Wound Dressings '[]'$  - Small Wound Dressing one or multiple wounds 0 X- 1 15 Medium Wound Dressing one or multiple wounds '[]'$  - 0 Large Wound Dressing one or multiple wounds '[]'$  - 0 Application of Medications - topical '[]'$  - 0 Application of Medications - injection INTERVENTIONS - Miscellaneous '[]'$  - External ear exam 0 '[]'$  - 0 Specimen Collection (cultures, biopsies, blood, body fluids, etc.) '[]'$  - 0 Specimen(s) / Culture(s) sent or taken to Lab for analysis '[]'$  - 0 Patient Transfer (multiple staff / Civil Service fast streamer / Similar devices) '[]'$  - 0 Simple Staple / Suture removal (25 or less) '[]'$  - 0 Complex Staple / Suture removal (26 or more) '[]'$  - 0 Hypo / Hyperglycemic Management (close monitor of Blood Glucose) '[]'$  - 0 Ankle / Brachial Index (ABI) - do not check if billed separately X- 1 5 Vital Signs Has the patient been seen at the hospital within the last three years: Yes Total Score: 80 Level Of Care: New/Established - Level 3 Electronic Signature(s) Signed: 06/02/2021 2:45:21 PM By: Georges Mouse, Minus Breeding RN Entered By: Georges Mouse, Minus Breeding on 06/02/2021 11:14:04 Drudge, [name] ([id number]) -------------------------------------------------------------------------------- Encounter Discharge Information Details Patient Name: [name], [name] Date of Service: 06/02/2021 10:15 AM Medical Record Number: [id number] Patient Account Number: [id number] Date of Birth/Sex: February 09, 1951 (69 y.o. F) Treating RN: Donnamarie Poag Primary Care Jaycub Noorani: Thereasa Distance Other Clinician: Referring Shaunice Levitan: Referral, Self Treating Kaylanni Ezelle/Extender: Skipper Cliche in Treatment: 1 Encounter  Discharge Information Items Discharge Condition: Stable Ambulatory Status: Wheelchair Discharge Destination: Home Transportation: Private Auto Accompanied By: friend Schedule Follow-up Appointment: Yes Clinical Summary of Care: Electronic Signature(s) Signed: 06/06/2021 11:12:12 AM By: Donnamarie Poag Entered By: Donnamarie Poag on 06/02/2021 11:30:28 Delapena, [name] ([id number]) -------------------------------------------------------------------------------- Lower Extremity Assessment Details Patient Name: Selover, [name] Date of Service: 06/02/2021 10:15 AM Medical Record Number: [id number] Patient Account Number: [id number] Date of Birth/Sex: [birth year] (69 y.o. F) Treating RN: Donnamarie Poag Primary Care Addisyn Leclaire: Thereasa Distance Other Clinician: Referring Azzie Thiem: Referral, Self Treating Taray Normoyle/Extender: Skipper Cliche in Treatment: 1 Edema Assessment Assessed: [Left: Yes] [Right: Yes] [Left: Edema] [Right: :] Calf Left: Right: Point of Measurement: 29 cm From Medial Instep 30 cm 37 cm Ankle Left: Right: Point of Measurement: 10 cm From Medial Instep 18.5 cm 19 cm Vascular Assessment Pulses: Dorsalis Pedis Palpable: [Left:Yes] [Right:Yes] Electronic Signature(s) Signed: 06/06/2021 11:12:12 AM By: Donnamarie Poag Entered ByDonnamarie Poag on 06/02/2021 10:33:02 Lassen, [name] ([id number]) -------------------------------------------------------------------------------- Multi Wound Chart Details Patient Name: Koral, [name] Date of Service: 06/02/2021 10:15 AM Medical Record Number: [id number] Patient Account Number: [id number] Date of Birth/Sex: 08-05-1951 (69 y.o. F) Treating RN: Dolan Amen Primary Care Narmeen Kerper: Thereasa Distance Other Clinician: Referring Kynzli Rease: Referral, Self Treating Artin Mceuen/Extender: Skipper Cliche in Treatment: 1 Vital Signs Height(in): 64 Pulse(bpm): 31 Weight(lbs): 207 Blood Pressure(mmHg): 102/57 Body Mass Index(BMI):  36 Temperature(F): 98 Respiratory Rate(breaths/min): 20 Photos: [N/A:N/A] Wound Location: Right Lower Leg Left, Distal, Midline Lower Leg N/A Wounding Event: Gradually Appeared Blister N/A Primary Etiology: Venous Leg Ulcer Venous Leg Ulcer N/A Comorbid History: Cataracts, Anemia, Asthma, Sleep Cataracts, Anemia, Asthma, Sleep N/A Apnea, Arrhythmia, Coronary Artery Apnea, Arrhythmia, Coronary Artery Disease, Hypertension, History of Disease, Hypertension, History of pressure wounds, Rheumatoid pressure wounds, Rheumatoid Arthritis, Osteoarthritis Arthritis, Osteoarthritis Date Acquired: 04/20/2021 05/31/2021 N/A Weeks of Treatment: 1 0 N/A Wound Status: Healed -  Epithelialized Open N/A Measurements L x W x D (cm) 0x0x0 1x1x0.1 N/A Area (cm) : 0 0.785 N/A Volume (cm) : 0 0.079 N/A % Reduction in Area: 100.00% N/A N/A % Reduction in Volume: 100.00% N/A N/A Classification: Full Thickness Without Exposed Full Thickness Without Exposed N/A Support Structures Support Structures Exudate Amount: None Present Medium N/A Exudate Type: N/A Serosanguineous N/A Exudate Color: N/A red, brown N/A Granulation Amount: None Present (0%) Large (67-100%) N/A Granulation Quality: N/A Red, Pink N/A Necrotic Amount: None Present (0%) N/A N/A Exposed Structures: Fascia: No Fat Layer (Subcutaneous Tissue): N/A Fat Layer (Subcutaneous Tissue): Yes No Fascia: No Tendon: No Tendon: No Muscle: No Muscle: No Joint: No Joint: No Bone: No Bone: No Epithelialization: Large (67-100%) N/A N/A Treatment Notes Electronic Signature(s) Signed: 06/02/2021 2:45:21 PM By: Georges Mouse, Minus Breeding RN Entered By: Georges Mouse, Minus Breeding on 06/02/2021 11:07:19 Maceachern, [name] ([id number]) -------------------------------------------------------------------------------- Multi-Disciplinary Care Plan Details Patient Name: Dodge, [name] Date of Service: 06/02/2021 10:15 AM Medical Record Number: [id number] Patient  Account Number: [id number] Date of Birth/Sex: 1951/07/05 (69 y.o. F) Treating RN: Dolan Amen Primary Care Daisa Stennis: Thereasa Distance Other Clinician: Referring Soraya Paquette: Referral, Self Treating Barrett Goldie/Extender: Skipper Cliche in Treatment: 1 Active Inactive Electronic Signature(s) Signed: 07/01/2021 9:04:15 AM By: Gretta Cool, BSN, RN, CWS, Kim RN, BSN Signed: 08/18/2021 4:51:00 PM By: Dolan Amen RN Previous Signature: 06/02/2021 2:45:21 PM Version By: Georges Mouse, Minus Breeding RN Entered By: Gretta Cool, BSN, RN, CWS, Kim on 07/01/2021 09:04:15 Broadwell, [name] ([id number]) -------------------------------------------------------------------------------- Pain Assessment Details Patient Name: [name] Date of Service: 06/02/2021 10:15 AM Medical Record Number: [id number] Patient Account Number: [id number] Date of Birth/Sex: 1951/05/10 (69 y.o. F) Treating RN: Donnamarie Poag Primary Care Addisyn Leclaire: Thereasa Distance Other Clinician: Referring Merly Hinkson: Referral, Self Treating Wilmoth Rasnic/Extender: Skipper Cliche in Treatment: 1 Active Problems Location of Pain Severity and Description of Pain Patient Has Paino Yes Site Locations Pain Location: Pain in Ulcers Rate the pain. Current Pain Level: 5 Pain Management and Medication Current Pain Management: Electronic Signature(s) Signed: 06/06/2021 11:12:12 AM By: Donnamarie Poag Entered By: Donnamarie Poag on 06/02/2021 10:23:29 Heckmann, [name] ([id number]) -------------------------------------------------------------------------------- Patient/Caregiver Education Details Patient Name: Stephani, [name] Date of Service: 06/02/2021 10:15 AM Medical Record Number: [id number] Patient Account Number: [id number] Date of Birth/Gender: [birth year] (69 y.o. F) Treating RN: Dolan Amen Primary Care Physician: Thereasa Distance Other Clinician: Referring Physician: Referral, Self Treating Physician/Extender: Skipper Cliche in Treatment: 1 Education  Assessment Education Provided To: Patient Education Topics Provided Wound/Skin Impairment: Methods: Explain/Verbal Responses: State content correctly Electronic Signature(s) Signed: 06/02/2021 2:45:21 PM By: Georges Mouse, Minus Breeding RN Entered By: Georges Mouse, Minus Breeding on 06/02/2021 11:14:16 Seelig, [name] ([id number]) -------------------------------------------------------------------------------- Wound Assessment Details Patient Name: Schone, [name] Date of Service: 06/02/2021 10:15 AM Medical Record Number: [id number] Patient Account Number: [id number] Date of Birth/Sex: 24-Sep-1951 (69 y.o. F) Treating RN: Donnamarie Poag Primary Care Terell Kincy: Thereasa Distance Other Clinician: Referring Sacora Hawbaker: Referral, Self Treating Yumna Ebers/Extender: Skipper Cliche in Treatment: 1 Wound Status Wound Number: 1 Primary Venous Leg Ulcer Etiology: Wound Location: Right Lower Leg Wound Healed - Epithelialized Wounding Event: Gradually Appeared Status: Date Acquired: 04/20/2021 Comorbid Cataracts, Anemia, Asthma, Sleep Apnea, Arrhythmia, Weeks Of Treatment: 1 History: Coronary Artery Disease, Hypertension, History of pressure Clustered Wound: No wounds, Rheumatoid Arthritis, Osteoarthritis Photos Wound Measurements Length: (cm) 0 Width: (cm) 0 Depth: (cm) 0 Area: (cm) 0 Volume: (cm) 0 % Reduction in Area: 100% % Reduction in Volume: 100% Epithelialization: Large (67-100%) Tunneling: No  Undermining: No Wound Description Classification: Full Thickness Without Exposed Support Structures Exudate Amount: None Present Foul Odor After Cleansing: No Slough/Fibrino No Wound Bed Granulation Amount: None Present (0%) Exposed Structure Necrotic Amount: None Present (0%) Fascia Exposed: No Fat Layer (Subcutaneous Tissue) Exposed: No Tendon Exposed: No Muscle Exposed: No Joint Exposed: No Bone Exposed: No Electronic Signature(s) Signed: 06/02/2021 2:45:21 PM By: Georges Mouse, Minus Breeding  RN Signed: 06/06/2021 11:12:12 AM By: Donnamarie Poag Entered By: Georges Mouse, Minus Breeding on 06/02/2021 11:06:46 Schwanke, [name] ([id number]) -------------------------------------------------------------------------------- Wound Assessment Details Patient Name: Andrades, [name] Date of Service: 06/02/2021 10:15 AM Medical Record Number: [id number] Patient Account Number: [id number] Date of Birth/Sex: [birth year] (69 y.o. F) Treating RN: Donnamarie Poag Primary Care Curby Carswell: Thereasa Distance Other Clinician: Referring Montrez Marietta: Referral, Self Treating Angline Schweigert/Extender: Skipper Cliche in Treatment: 1 Wound Status Wound Number: 2 Primary Venous Leg Ulcer Etiology: Wound Location: Left, Distal, Midline Lower Leg Wound Open Wounding Event: Blister Status: Date Acquired: 05/31/2021 Comorbid Cataracts, Anemia, Asthma, Sleep Apnea, Arrhythmia, Weeks Of Treatment: 0 History: Coronary Artery Disease, Hypertension, History of pressure Clustered Wound: No wounds, Rheumatoid Arthritis, Osteoarthritis Photos Wound Measurements Length: (cm) 1 Width: (cm) 1 Depth: (cm) 0.1 Area: (cm) 0.785 Volume: (cm) 0.079 % Reduction in Area: % Reduction in Volume: Tunneling: No Undermining: No Wound Description Classification: Full Thickness Without Exposed Support Structu Exudate Amount: Medium Exudate Type: Serosanguineous Exudate Color: red, brown res Foul Odor After Cleansing: No Slough/Fibrino No Wound Bed Granulation Amount: Large (67-100%) Exposed Structure Granulation Quality: Red, Pink Fascia Exposed: No Fat Layer (Subcutaneous Tissue) Exposed: Yes Tendon Exposed: No Muscle Exposed: No Joint Exposed: No Bone Exposed: No Electronic Signature(s) Signed: 06/06/2021 11:12:12 AM By: Donnamarie Poag Entered ByDonnamarie Poag on 06/02/2021 10:29:49 Kavanaugh, [name] ([id number]) -------------------------------------------------------------------------------- Vitals Details Patient Name: Mateus,  [name] Date of Service: 06/02/2021 10:15 AM Medical Record Number: [id number] Patient Account Number: [id number] Date of Birth/Sex: 09-28-1951 (69 y.o. F) Treating RN: Donnamarie Poag Primary Care Wilbur Oakland: Thereasa Distance Other Clinician: Referring Bronnie Vasseur: Referral, Self Treating Shunte Senseney/Extender: Skipper Cliche in Treatment: 1 Vital Signs Time Taken: 10:23 Temperature (F): 98 Height (in): 64 Pulse (bpm): 73 Weight (lbs): 207 Respiratory Rate (breaths/min): 20 Body Mass Index (BMI): 35.5 Blood Pressure (mmHg): 102/57 Reference Range: 80 - 120 mg / dl Electronic Signature(s) Signed: 06/06/2021 11:12:12 AM By: Donnamarie Poag Entered ByDonnamarie Poag on 06/02/2021 10:23:18

## 2021-06-09 ENCOUNTER — Ambulatory Visit: Payer: Medicare Other | Admitting: Physician Assistant

## 2021-06-16 ENCOUNTER — Ambulatory Visit: Payer: Medicare Other | Admitting: Physician Assistant

## 2021-06-16 ENCOUNTER — Encounter (INDEPENDENT_AMBULATORY_CARE_PROVIDER_SITE_OTHER): Payer: Medicare Other

## 2021-06-16 ENCOUNTER — Ambulatory Visit (INDEPENDENT_AMBULATORY_CARE_PROVIDER_SITE_OTHER): Payer: Medicare Other | Admitting: Nurse Practitioner

## 2021-07-07 DIAGNOSIS — D649 Anemia, unspecified: Secondary | ICD-10-CM | POA: Insufficient documentation

## 2021-07-07 DIAGNOSIS — E441 Mild protein-calorie malnutrition: Secondary | ICD-10-CM | POA: Insufficient documentation

## 2021-08-05 ENCOUNTER — Other Ambulatory Visit (INDEPENDENT_AMBULATORY_CARE_PROVIDER_SITE_OTHER): Payer: Self-pay | Admitting: Nurse Practitioner

## 2021-08-05 DIAGNOSIS — I890 Lymphedema, not elsewhere classified: Secondary | ICD-10-CM

## 2021-08-10 ENCOUNTER — Ambulatory Visit (INDEPENDENT_AMBULATORY_CARE_PROVIDER_SITE_OTHER): Payer: Medicare Other

## 2021-08-10 ENCOUNTER — Ambulatory Visit (INDEPENDENT_AMBULATORY_CARE_PROVIDER_SITE_OTHER): Payer: Medicare Other | Admitting: Nurse Practitioner

## 2021-08-10 ENCOUNTER — Other Ambulatory Visit: Payer: Self-pay

## 2021-08-10 ENCOUNTER — Encounter (INDEPENDENT_AMBULATORY_CARE_PROVIDER_SITE_OTHER): Payer: Self-pay | Admitting: Nurse Practitioner

## 2021-08-10 VITALS — BP 115/75 | HR 78 | Resp 16 | Wt 183.6 lb

## 2021-08-10 DIAGNOSIS — I10 Essential (primary) hypertension: Secondary | ICD-10-CM

## 2021-08-10 DIAGNOSIS — I890 Lymphedema, not elsewhere classified: Secondary | ICD-10-CM | POA: Diagnosis not present

## 2021-08-10 DIAGNOSIS — K219 Gastro-esophageal reflux disease without esophagitis: Secondary | ICD-10-CM

## 2021-08-16 ENCOUNTER — Encounter (INDEPENDENT_AMBULATORY_CARE_PROVIDER_SITE_OTHER): Payer: Self-pay | Admitting: Nurse Practitioner

## 2021-08-16 NOTE — Progress Notes (Signed)
Subjective:    Patient ID: [name], female    DOB: [birth year], 70 y.o.   MRN: [id number] Chief Complaint  Patient presents with   Follow-up    Ultrasound follow up    [name] is a 70 year old female that returns today for evaluation of her lymphedema.  Currently the patient does have some ulcerations on her lower extremities and is in [place] wraps done by the wound care center.  She will continue to be maintained by the wound care center.  At the previous office visit a lymphedema pump was recommended for the patient.  She notes that it just arrived today.  She has not had time to see if it has been beneficial to her yet.  The patient also can tries to continue to do other conservative activities such as elevation of her lower extremities.  She is tolerating the wraps well.   Review of Systems  Cardiovascular:  Positive for leg swelling.  Skin:  Positive for wound.  All other systems reviewed and are negative.     Objective:   Physical Exam Vitals reviewed.  HENT:     Head: Normocephalic.  Cardiovascular:     Rate and Rhythm: Normal rate.  Pulmonary:     Effort: Pulmonary effort is normal.  Skin:    General: Skin is warm and dry.  Neurological:     Mental Status: She is alert and oriented to person, place, and time.  Psychiatric:        Mood and Affect: Mood normal.        Behavior: Behavior normal.        Thought Content: Thought content normal.        Judgment: Judgment normal.    BP 115/75 (BP Location: Left Arm)   Pulse 78   Resp 16   Wt 183 lb 9.6 oz (83.3 kg)   BMI 31.51 kg/m   Past Medical History:  Diagnosis Date   Anemia    Arthritis    Cellulitis    Hypertension     Social History   Socioeconomic History   Marital status: Married    Spouse name: Not on file   Number of children: Not on file   Years of education: Not on file   Highest education level: Not on file  Occupational History   Not on file  Tobacco Use   Smoking status:  Never   Smokeless tobacco: Never  Substance and Sexual Activity   Alcohol use: Never   Drug use: Never   Sexual activity: Not on file  Other Topics Concern   Not on file  Social History Narrative   Not on file   Social Determinants of Health   Financial Resource Strain: Not on file  Food Insecurity: Not on file  Transportation Needs: Not on file  Physical Activity: Not on file  Stress: Not on file  Social Connections: Not on file  Intimate Partner Violence: Not on file    Past Surgical History:  Procedure Laterality Date   CESAREAN SECTION     FOOT SURGERY Left    GALLBLADDER SURGERY     MOUTH SURGERY     WISDOM TOOTH EXTRACTION      Family History  Problem Relation Age of Onset   Diabetes Mother    Hypertension Mother    Hypertension Father     Allergies  Allergen Reactions   Sulfa Antibiotics Hives and Itching   Amoxicillin-Pot Clavulanate Nausea Only   Valdecoxib Rash  No flowsheet data found.    CMP  No results found for: NA, K, CL, CO2, GLUCOSE, BUN, CREATININE, CALCIUM, PROT, ALBUMIN, AST, ALT, ALKPHOS, BILITOT, GFRNONAA, GFRAA   No results found.     Assessment & Plan:   1. Lymphedema  No surgery or intervention at this point in time.    Currently the patient is maintained with [place] wraps done by the wound care center.  Once she is able to be removed from the [place] wraps, it is recommended that she transition to medical grade compression stockings.  I have reviewed my discussion with the patient regarding lymphedema and why it  causes symptoms.  The patient is reminded to put the stockings on first thing in the morning and removing them in the evening. The patient is instructed specifically not to sleep in the stockings.   In addition, behavioral modification throughout the day will be continued.  This will include frequent elevation (such as in a recliner), use of over the counter pain medications as needed and exercise such as walking.  I  have reviewed systemic causes for chronic edema such as liver, kidney and cardiac etiologies and there does not appear to be any significant changes in these organ systems over the past year.  The patient is under the impression that these organ systems are all stable and unchanged.    The patient will begin aggressive use of the  lymph pump.  This will continue to improve the edema control and prevent sequela such as ulcers and infections.   The patient will follow-up with me in 6 months  2. Benign essential hypertension Continue antihypertensive medications as already ordered, these medications have been reviewed and there are no changes at this time.   3. Gastroesophageal reflux disease, unspecified whether esophagitis present Continue PPI as already ordered, this medication has been reviewed and there are no changes at this time.  Avoidence of caffeine and alcohol  Moderate elevation of the head of the bed     Current Outpatient Medications on File Prior to Visit  Medication Sig Dispense Refill   acetaminophen (TYLENOL) 650 MG CR tablet Take by mouth.     albuterol (VENTOLIN HFA) 108 (90 Base) MCG/ACT inhaler Inhale into the lungs.     amLODipine (NORVASC) 10 MG tablet Take 1 tablet by mouth daily.     apixaban (ELIQUIS) 5 MG TABS tablet Take 1 tablet by mouth 2 (two) times daily.     ascorbic acid (VITAMIN C) 500 MG tablet Take by mouth.     atorvastatin (LIPITOR) 10 MG tablet Take by mouth.     budesonide-formoterol (SYMBICORT) 80-4.5 MCG/ACT inhaler Inhale into the lungs.     ferrous sulfate 325 (65 FE) MG tablet Take by mouth.     gabapentin (NEURONTIN) 250 MG/5ML solution Take by mouth.     HYDROcodone-acetaminophen (NORCO/VICODIN) 5-325 MG tablet Take by mouth.     hydroxychloroquine (PLAQUENIL) 200 MG tablet Take 200 mg by mouth 2 (two) times daily.     loratadine (CLARITIN) 10 MG tablet Take by mouth.     mometasone (ELOCON) 0.1 % ointment Apply topically 2 (two) times  daily.     mupirocin ointment (BACTROBAN) 2 % Apply topically.     PARoxetine (PAXIL) 30 MG tablet Take by mouth.     diclofenac Sodium (VOLTAREN) 1 % GEL Apply 2 g topically 4 (four) times daily. (Patient not taking: Reported on 08/10/2021)     magnesium oxide (MAG-OX) 400 MG tablet Take  1 tablet by mouth daily. (Patient not taking: Reported on 08/10/2021)     potassium chloride (KLOR-CON) 10 MEQ tablet Take by mouth. (Patient not taking: Reported on 08/10/2021)     triamterene-hydrochlorothiazide (MAXZIDE-25) 37.5-25 MG tablet Take 1 tablet by mouth daily. (Patient not taking: Reported on 08/10/2021)     No current facility-administered medications on file prior to visit.    There are no Patient Instructions on file for this visit. No follow-ups on file.   Kris Hartmann, NP

## 2021-08-18 ENCOUNTER — Encounter: Payer: Medicare Other | Attending: Physician Assistant | Admitting: Physician Assistant

## 2021-08-18 ENCOUNTER — Other Ambulatory Visit: Payer: Self-pay

## 2021-08-18 DIAGNOSIS — I482 Chronic atrial fibrillation, unspecified: Secondary | ICD-10-CM | POA: Diagnosis not present

## 2021-08-18 DIAGNOSIS — L84 Corns and callosities: Secondary | ICD-10-CM | POA: Diagnosis not present

## 2021-08-18 DIAGNOSIS — I890 Lymphedema, not elsewhere classified: Secondary | ICD-10-CM | POA: Insufficient documentation

## 2021-08-18 DIAGNOSIS — L97322 Non-pressure chronic ulcer of left ankle with fat layer exposed: Secondary | ICD-10-CM | POA: Diagnosis not present

## 2021-08-18 DIAGNOSIS — N189 Chronic kidney disease, unspecified: Secondary | ICD-10-CM | POA: Diagnosis not present

## 2021-08-18 DIAGNOSIS — I2510 Atherosclerotic heart disease of native coronary artery without angina pectoris: Secondary | ICD-10-CM | POA: Diagnosis not present

## 2021-08-18 DIAGNOSIS — I480 Paroxysmal atrial fibrillation: Secondary | ICD-10-CM | POA: Insufficient documentation

## 2021-08-18 DIAGNOSIS — M069 Rheumatoid arthritis, unspecified: Secondary | ICD-10-CM | POA: Diagnosis not present

## 2021-08-18 DIAGNOSIS — L97812 Non-pressure chronic ulcer of other part of right lower leg with fat layer exposed: Secondary | ICD-10-CM | POA: Diagnosis not present

## 2021-08-18 DIAGNOSIS — L97822 Non-pressure chronic ulcer of other part of left lower leg with fat layer exposed: Secondary | ICD-10-CM | POA: Diagnosis not present

## 2021-08-18 DIAGNOSIS — J449 Chronic obstructive pulmonary disease, unspecified: Secondary | ICD-10-CM | POA: Diagnosis not present

## 2021-08-18 DIAGNOSIS — I129 Hypertensive chronic kidney disease with stage 1 through stage 4 chronic kidney disease, or unspecified chronic kidney disease: Secondary | ICD-10-CM | POA: Diagnosis not present

## 2021-08-18 DIAGNOSIS — G4730 Sleep apnea, unspecified: Secondary | ICD-10-CM | POA: Insufficient documentation

## 2021-08-18 DIAGNOSIS — Z7901 Long term (current) use of anticoagulants: Secondary | ICD-10-CM | POA: Diagnosis not present

## 2021-08-18 DIAGNOSIS — I872 Venous insufficiency (chronic) (peripheral): Secondary | ICD-10-CM | POA: Diagnosis not present

## 2021-08-18 DIAGNOSIS — D649 Anemia, unspecified: Secondary | ICD-10-CM | POA: Diagnosis not present

## 2021-08-18 NOTE — Progress Notes (Signed)
Islam, [name] ([id number]) Visit Report for 08/18/2021 Abuse/Suicide Risk Screen Details Patient Name: [name], [name]. Date of Service: 08/18/2021 12:45 PM Medical Record Number: [id number] Patient Account Number: [id number] Date of Birth/Sex: 1951-10-18 (70 y.o. F) Treating RN: Dolan Amen Primary Care Vaunda Gutterman: Thereasa Distance Other Clinician: Referring Genoveva Singleton: Referral, Self Treating Dottie Vaquerano/Extender: Skipper Cliche in Treatment: 0 Abuse/Suicide Risk Screen Items Answer ABUSE RISK SCREEN: Has anyone close to you tried to hurt or harm you recentlyo No Do you feel uncomfortable with anyone in your familyo No Has anyone forced you do things that you didnot want to doo No Electronic Signature(s) Signed: 08/18/2021 4:48:21 PM By: Dolan Amen RN Entered By: Dolan Amen on 08/18/2021 12:59:34 Plumb, [name] ([id number]) -------------------------------------------------------------------------------- Activities of Daily Living Details Patient Name: [name] Date of Service: 08/18/2021 12:45 PM Medical Record Number: [id number] Patient Account Number: [id number] Date of Birth/Sex: Aug 04, 1951 (70 y.o. F) Treating RN: Dolan Amen Primary Care Salia Cangemi: Thereasa Distance Other Clinician: Referring Suyash Amory: Referral, Self Treating Dacari Beckstrand/Extender: Skipper Cliche in Treatment: 0 Activities of Daily Living Items Answer Activities of Daily Living (Please select one for each item) Drive Automobile Not Able Take Medications Completely Able Use Telephone Completely Able Care for Appearance Completely Able Use Toilet Completely Able Bath / Shower Need Assistance Dress Self Completely Able Feed Self Completely Able Walk Need Assistance Get In / Out Bed Completely Able Housework Need Assistance Prepare Meals Completely [place] for Self Completely Able Electronic Signature(s) Signed: 08/18/2021 4:48:21 PM By: Dolan Amen  RN Entered By: Dolan Amen on 08/18/2021 13:00:15 Bartz, [name] ([id number]) -------------------------------------------------------------------------------- Education Screening Details Patient Name: [name] Date of Service: 08/18/2021 12:45 PM Medical Record Number: [id number] Patient Account Number: [id number] Date of Birth/Sex: 03-21-1951 (70 y.o. F) Treating RN: Dolan Amen Primary Care Alejandria Wessells: Thereasa Distance Other Clinician: Referring Eulala Newcombe: Referral, Self Treating Salmaan Patchin/Extender: Skipper Cliche in Treatment: 0 Primary Learner Assessed: Patient Learning Preferences/Education Level/Primary Language Learning Preference: Explanation, Demonstration Highest Education Level: High School Preferred Language: English Cognitive Barrier Language Barrier: No Translator Needed: No Memory Deficit: No Emotional Barrier: No Cultural/Religious Beliefs Affecting Medical Care: No Physical Barrier Impaired Vision: No Impaired Hearing: No Decreased Hand dexterity: No Knowledge/Comprehension Knowledge Level: Medium Comprehension Level: Medium Ability to understand written instructions: Medium Ability to understand verbal instructions: Medium Motivation Anxiety Level: Calm Cooperation: Cooperative Education Importance: Acknowledges Need Interest in Health Problems: Asks Questions Perception: Coherent Willingness to Engage in Self-Management Medium Activities: Readiness to Engage in Self-Management Medium Activities: Electronic Signature(s) Signed: 08/18/2021 4:48:21 PM By: Dolan Amen RN Entered By: Dolan Amen on 08/18/2021 13:00:52 Offenberger, [name] ([id number]) -------------------------------------------------------------------------------- Fall Risk Assessment Details Patient Name: Beckel, [name] Date of Service: 08/18/2021 12:45 PM Medical Record Number: [id number] Patient Account Number: [id number] Date of Birth/Sex: September 25, 1951 (70 y.o.  F) Treating RN: Dolan Amen Primary Care Marvette Schamp: Thereasa Distance Other Clinician: Referring Nathanel Tallman: Referral, Self Treating Epiphany Seltzer/Extender: Skipper Cliche in Treatment: 0 Fall Risk Assessment Items Have you had 2 or more falls in the last 12 monthso 0 No Have you had any fall that resulted in injury in the last 12 monthso 0 No FALLS RISK SCREEN History of falling - immediate or within 3 months 0 No Secondary diagnosis (Do you have 2 or more medical diagnoseso) 15 Yes Ambulatory aid None/bed rest/wheelchair/nurse 0 Yes Crutches/cane/walker 0 No Furniture 0 No Intravenous therapy Access/Saline/Heparin Lock 0 No Gait/Transferring Normal/ bed rest/ wheelchair  0 Yes Weak (short steps with or without shuffle, stooped but able to lift head while walking, may 0 No seek support from furniture) Impaired (short steps with shuffle, may have difficulty arising from chair, head down, impaired 0 No balance) Mental Status Oriented to own ability 0 Yes Electronic Signature(s) Signed: 08/18/2021 4:48:21 PM By: Dolan Amen RN Entered By: Dolan Amen on 08/18/2021 13:01:11 Gadberry, [name] ([id number]) -------------------------------------------------------------------------------- Foot Assessment Details Patient Name: [name] Date of Service: 08/18/2021 12:45 PM Medical Record Number: [id number] Patient Account Number: [id number] Date of Birth/Sex: 05-20-1951 (70 y.o. F) Treating RN: Dolan Amen Primary Care Wanya Bangura: Thereasa Distance Other Clinician: Referring Dain Laseter: Referral, Self Treating Eloise Picone/Extender: Skipper Cliche in Treatment: 0 Foot Assessment Items Site Locations + = Sensation present, - = Sensation absent, C = Callus, U = Ulcer R = Redness, W = Warmth, M = Maceration, PU = Pre-ulcerative lesion F = Fissure, S = Swelling, D = Dryness Assessment Right: Left: Other Deformity: No No Prior Foot Ulcer: No No Prior Amputation: No No Charcot  Joint: No No Ambulatory Status: Ambulatory With Help Assistance Device: Wheelchair Gait: Buyer, retail Signature(s) Signed: 08/18/2021 4:48:21 PM By: Dolan Amen RN Entered By: Dolan Amen on 08/18/2021 13:02:01 Fant, [name] ([id number]) -------------------------------------------------------------------------------- Nutrition Risk Screening Details Patient Name: Gienger, [name] Date of Service: 08/18/2021 12:45 PM Medical Record Number: [id number] Patient Account Number: [id number] Date of Birth/Sex: 1951-09-17 (69 y.o. F) Treating RN: Dolan Amen Primary Care Harel Repetto: Thereasa Distance Other Clinician: Referring Azrael Maddix: Referral, Self Treating Cephus Tupy/Extender: Skipper Cliche in Treatment: 0 Height (in): 64 Weight (lbs): 183 Body Mass Index (BMI): 31.4 Nutrition Risk Screening Items Score Screening NUTRITION RISK SCREEN: I have an illness or condition that made me change the kind and/or amount of food I eat 0 No I eat fewer than two meals per day 0 No I eat few fruits and vegetables, or milk products 0 No I have three or more drinks of beer, liquor or wine almost every day 0 No I have tooth or mouth problems that make it hard for me to eat 0 No I don't always have enough money to buy the food I need 0 No I eat alone most of the time 0 No I take three or more different prescribed or over-the-counter drugs a day 1 Yes Without wanting to, I have lost or gained 10 pounds in the last six months 0 No I am not always physically able to shop, cook and/or feed myself 0 No Nutrition Protocols Good Risk Protocol 0 No interventions needed Moderate Risk Protocol High Risk Proctocol Risk Level: Good Risk Score: 1 Electronic Signature(s) Signed: 08/18/2021 4:48:21 PM By: Dolan Amen RN Entered By: Dolan Amen on 08/18/2021 13:01:41

## 2021-08-18 NOTE — Progress Notes (Signed)
[name], [name] ([id number]) Visit Report for 08/18/2021 Allergy List Details Patient Name: [name], [name]. Date of Service: 08/18/2021 12:45 PM Medical Record Number: [id number] Patient Account Number: [id number] Date of Birth/Sex: 15-Oct-1951 (70 y.o. F) Treating RN: Dolan Amen Primary Care Cadyn Rodger: Thereasa Distance Other Clinician: Referring Tena Linebaugh: Referral, Self Treating Ger Nicks/Extender: Jeri Cos Weeks in Treatment: 0 Allergies Active Allergies Augmentin Reaction: rash Severity: Moderate Sulfa (Sulfonamide Antibiotics) Reaction: itching Severity: Moderate Allergy Notes Electronic Signature(s) Signed: 08/18/2021 4:48:21 PM By: Dolan Amen RN Entered By: Dolan Amen on 08/18/2021 12:57:26 Nickerson, [name] ([id number]) -------------------------------------------------------------------------------- Arrival Information Details Patient Name: [name] Date of Service: 08/18/2021 12:45 PM Medical Record Number: [id number] Patient Account Number: [id number] Date of Birth/Sex: [birth year] (69 y.o. F) Treating RN: Dolan Amen Primary Care Woodie Trusty: Thereasa Distance Other Clinician: Referring Omaira Mellen: Referral, Self Treating Zakye Baby/Extender: Skipper Cliche in Treatment: 0 Visit Information Patient Arrived: Wheel Chair Arrival Time: 12:53 Accompanied By: self Transfer Assistance: Manual Patient Identification Verified: Yes Secondary Verification Process Completed: Yes Patient Has Alerts: Yes Patient Alerts: Patient on Blood Thinner ***ELIQUIS*** History Since Last Visit Electronic Signature(s) Signed: 08/18/2021 1:39:53 PM By: Dolan Amen RN Entered By: Dolan Amen on 08/18/2021 13:39:53 Dilling, [name] ([id number]) -------------------------------------------------------------------------------- Compression Therapy Details Patient Name: [name], [name] Date of Service: 08/18/2021 12:45 PM Medical Record Number: [id number] Patient Account  Number: [id number] Date of Birth/Sex: [birth year] (69 y.o. F) Treating RN: Dolan Amen Primary Care Briant Angelillo: Thereasa Distance Other Clinician: Referring Arora Coakley: Referral, Self Treating Raneen Jaffer/Extender: Skipper Cliche in Treatment: 0 Compression Therapy Performed for Wound Assessment: Wound #3 Right,Lateral Lower Leg Performed By: Clinician Dolan Amen, RN Compression Type: Rolena Infante Post Procedure Diagnosis Same as Pre-procedure Electronic Signature(s) Signed: 08/18/2021 4:48:21 PM By: Dolan Amen RN Entered By: Dolan Amen on 08/18/2021 13:51:22 Dassow, [name] ([id number]) -------------------------------------------------------------------------------- Compression Therapy Details Patient Name: [name] Date of Service: 08/18/2021 12:45 PM Medical Record Number: [id number] Patient Account Number: [id number] Date of Birth/Sex: 1951-01-30 (69 y.o. F) Treating RN: Dolan Amen Primary Care Hoby Kawai: Thereasa Distance Other Clinician: Referring Jillian Warth: Referral, Self Treating Orlander Norwood/Extender: Skipper Cliche in Treatment: 0 Compression Therapy Performed for Wound Assessment: Wound #4 Left,Medial Malleolus Performed By: Cora Daniels, RN Compression Type: Rolena Infante Post Procedure Diagnosis Same as Pre-procedure Electronic Signature(s) Signed: 08/18/2021 4:48:21 PM By: Dolan Amen RN Entered By: Dolan Amen on 08/18/2021 13:51:22 [name], [name] ([id number]) -------------------------------------------------------------------------------- Compression Therapy Details Patient Name: [name] Date of Service: 08/18/2021 12:45 PM Medical Record Number: [id number] Patient Account Number: [id number] Date of Birth/Sex: 1951/02/18 (69 y.o. F) Treating RN: Dolan Amen Primary Care Terron Merfeld: Thereasa Distance Other Clinician: Referring Landan Fedie: Referral, Self Treating Freman Lapage/Extender: Skipper Cliche in Treatment: 0 Compression  Therapy Performed for Wound Assessment: Wound #5 Left,Lateral Lower Leg Performed By: Clinician Dolan Amen, RN Compression Type: Rolena Infante Post Procedure Diagnosis Same as Pre-procedure Electronic Signature(s) Signed: 08/18/2021 4:48:21 PM By: Dolan Amen RN Entered By: Dolan Amen on 08/18/2021 13:51:22 Geibel, [name] ([id number]) -------------------------------------------------------------------------------- Lower Extremity Assessment Details Patient Name: [name] Date of Service: 08/18/2021 12:45 PM Medical Record Number: [id number] Patient Account Number: [id number] Date of Birth/Sex: 05-Apr-1951 (69 y.o. F) Treating RN: Dolan Amen Primary Care Jetta Murray: Thereasa Distance Other Clinician: Referring Izabela Ow: Referral, Self Treating Kenidee Cregan/Extender: Jeri Cos Weeks in Treatment: 0 Edema Assessment Assessed: [Left: Yes] [Right: Yes] Edema: [Left: Yes] [Right: Yes] Calf Left: Right: Point of Measurement: 32 cm From  Medial Instep 31 cm 30.5 cm Ankle Left: Right: Point of Measurement: 12 cm From Medial Instep 19 cm 19.5 cm Knee To Floor Left: Right: From Medial Instep 39 cm 39 cm Vascular Assessment Pulses: Dorsalis Pedis Palpable: [Left:Yes] [Right:Yes] Electronic Signature(s) Signed: 08/18/2021 4:48:21 PM By: Dolan Amen RN Entered By: Dolan Amen on 08/18/2021 13:23:05 Aultman, [name] ([id number]) -------------------------------------------------------------------------------- Multi Wound Chart Details Patient Name: [name] Date of Service: 08/18/2021 12:45 PM Medical Record Number: [id number] Patient Account Number: [id number] Date of Birth/Sex: February 17, 1951 (69 y.o. F) Treating RN: Dolan Amen Primary Care Jakaylee Sasaki: Thereasa Distance Other Clinician: Referring Darlene Brozowski: Referral, Self Treating Trevion Hoben/Extender: Skipper Cliche in Treatment: 0 Vital Signs Height(in): 67 Pulse(bpm): 61 Weight(lbs): 183 Blood Pressure(mmHg):  120/73 Body Mass Index(BMI): 31 Temperature(F): 97.8 Respiratory Rate(breaths/min): 18 Photos: Wound Location: Right, Lateral Lower Leg Left, Medial Malleolus Left, Lateral Lower Leg Wounding Event: Gradually Appeared Other Lesion Other Lesion Primary Etiology: Venous Leg Ulcer Atypical Atypical Comorbid History: Cataracts, Anemia, Lymphedema, Cataracts, Anemia, Lymphedema, Cataracts, Anemia, Lymphedema, Asthma, Chronic Obstructive Asthma, Chronic Obstructive Asthma, Chronic Obstructive Pulmonary Disease (COPD), Sleep Pulmonary Disease (COPD), Sleep Pulmonary Disease (COPD), Sleep Apnea, Arrhythmia, Coronary Artery Apnea, Arrhythmia, Coronary Artery Apnea, Arrhythmia, Coronary Artery Disease, Hypertension, History of Disease, Hypertension, History of Disease, Hypertension, History of pressure wounds, Rheumatoid pressure wounds, Rheumatoid pressure wounds, Rheumatoid Arthritis, Osteoarthritis Arthritis, Osteoarthritis Arthritis, Osteoarthritis Date Acquired: 08/04/2021 07/18/2021 07/18/2021 Weeks of Treatment: 0 0 0 Wound Status: Open Open Open Clustered Wound: No No Yes Clustered Quantity: N/A N/A 3 Measurements L x W x D (cm) 5.7x1.2x0.1 0.9x1.5x0.1 4.7x1x0.1 Area (cm) : 5.372 1.06 3.691 Volume (cm) : 0.537 0.106 0.369 % Reduction in Area: 0.00% 0.00% 0.00% % Reduction in Volume: 0.00% 0.00% 0.00% Classification: Full Thickness Without Exposed Full Thickness Without Exposed Full Thickness Without Exposed Support Structures Support Structures Support Structures Exudate Amount: Medium Medium Medium Exudate Type: Serosanguineous Serosanguineous Serous Exudate Color: red, brown red, brown amber Granulation Amount: Large (67-100%) Small (1-33%) Large (67-100%) Granulation Quality: Red Red Red Necrotic Amount: None Present (0%) Large (67-100%) None Present (0%) Exposed Structures: Fat Layer (Subcutaneous Tissue): Fat Layer (Subcutaneous Tissue): Fat Layer (Subcutaneous Tissue): Yes Yes  Yes Fascia: No Fascia: No Fascia: No Tendon: No Tendon: No Tendon: No Muscle: No Muscle: No Muscle: No Joint: No Joint: No Joint: No Bone: No Bone: No Bone: No Epithelialization: None None Large (67-100%) Treatment Notes Electronic Signature(s) Toscano, [name] ([id number]) Signed: 08/18/2021 4:48:21 PM By: Dolan Amen RN Entered By: Dolan Amen on 08/18/2021 13:51:09 Doswell, [name] ([id number]) -------------------------------------------------------------------------------- Multi-Disciplinary Care Plan Details Patient Name: Shomaker, [name] Date of Service: 08/18/2021 12:45 PM Medical Record Number: [id number] Patient Account Number: [id number] Date of Birth/Sex: Oct 06, 1951 (69 y.o. F) Treating RN: Dolan Amen Primary Care Gerardine Peltz: Thereasa Distance Other Clinician: Referring Sadik Piascik: Referral, Self Treating Alani Sabbagh/Extender: Skipper Cliche in Treatment: 0 Active Inactive Abuse / Safety / Falls / Self Care Management Nursing Diagnoses: History of Falls Impaired physical mobility Potential for falls Potential for injury related to transfers Self care deficit: actual or potential Goals: Patient will not develop complications from immobility Date Initiated: 08/18/2021 Target Resolution Date: 09/17/2021 Goal Status: Active Patient/caregiver will verbalize understanding of skin care regimen Date Initiated: 08/18/2021 Target Resolution Date: 08/18/2021 Goal Status: Active Patient/caregiver will verbalize/demonstrate measures taken to prevent injury and/or falls Date Initiated: 08/18/2021 Target Resolution Date: 08/18/2021 Goal Status: Active Interventions: Call light and/or bell within patient's reach Assess Activities of Daily Living upon  admission and as needed Assess fall risk on admission and as needed Assess: immobility, friction, shearing, incontinence upon admission and as needed Assess personal safety and home safety (as indicated) on admission and as  needed Assess self care needs on admission and as needed Provide education on basic hygiene Provide education on fall prevention Notes: Orientation to the Wound Care Program Nursing Diagnoses: Knowledge deficit related to the wound healing center program Goals: Patient/caregiver will verbalize understanding of the [place] Date Initiated: 08/18/2021 Target Resolution Date: 08/18/2021 Goal Status: Active Interventions: Provide education on orientation to the wound center Notes: Wound/Skin Impairment Nursing Diagnoses: Impaired tissue integrity Goals: Want, [name] ([id number]) Patient/caregiver will verbalize understanding of skin care regimen Date Initiated: 08/18/2021 Target Resolution Date: 08/18/2021 Goal Status: Active Ulcer/skin breakdown will have a volume reduction of 30% by week 4 Date Initiated: 08/18/2021 Target Resolution Date: 09/17/2021 Goal Status: Active Ulcer/skin breakdown will have a volume reduction of 50% by week 8 Date Initiated: 08/18/2021 Target Resolution Date: 10/18/2021 Goal Status: Active Ulcer/skin breakdown will have a volume reduction of 80% by week 12 Date Initiated: 08/18/2021 Target Resolution Date: 11/17/2021 Goal Status: Active Ulcer/skin breakdown will heal within 14 weeks Date Initiated: 08/18/2021 Target Resolution Date: 12/18/2021 Goal Status: Active Interventions: Assess patient/caregiver ability to obtain necessary supplies Assess patient/caregiver ability to perform ulcer/skin care regimen upon admission and as needed Assess ulceration(s) every visit Provide education on ulcer and skin care Treatment Activities: Patient referred to home care : 08/18/2021 Skin care regimen initiated : 08/18/2021 Notes: Electronic Signature(s) Signed: 08/18/2021 4:48:21 PM By: Dolan Amen RN Entered By: Dolan Amen on 08/18/2021 13:50:21 Ragle, [name]  ([id number]) -------------------------------------------------------------------------------- Pain Assessment Details Patient Name: Catalina, [name] Date of Service: 08/18/2021 12:45 PM Medical Record Number: [id number] Patient Account Number: [id number] Date of Birth/Sex: Feb 15, 1951 (69 y.o. F) Treating RN: Dolan Amen Primary Care Yetunde Leis: Thereasa Distance Other Clinician: Referring Rennee Coyne: Referral, Self Treating Maxie Slovacek/Extender: Skipper Cliche in Treatment: 0 Active Problems Location of Pain Severity and Description of Pain Patient Has Paino No Site Locations Rate the pain. Current Pain Level: 0 Pain Management and Medication Current Pain Management: Electronic Signature(s) Signed: 08/18/2021 4:48:21 PM By: Dolan Amen RN Entered By: Dolan Amen on 08/18/2021 12:54:20 Orth, [name] ([id number]) -------------------------------------------------------------------------------- Wound Assessment Details Patient Name: Lata, [name] Date of Service: 08/18/2021 12:45 PM Medical Record Number: [id number] Patient Account Number: [id number] Date of Birth/Sex: [birth year] (69 y.o. F) Treating RN: Dolan Amen Primary Care Liliahna Cudd: Thereasa Distance Other Clinician: Referring Morrissa Shein: Referral, Self Treating Wajiha Versteeg/Extender: Skipper Cliche in Treatment: 0 Wound Status Wound Number: 3 Primary Venous Leg Ulcer Etiology: Wound Location: Right, Lateral Lower Leg Wound Open Wounding Event: Gradually Appeared Status: Date Acquired: 08/04/2021 Comorbid Cataracts, Anemia, Lymphedema, Asthma, Chronic Weeks Of Treatment: 0 History: Obstructive Pulmonary Disease (COPD), Sleep Apnea, Clustered Wound: No Arrhythmia, Coronary Artery Disease, Hypertension, History of pressure wounds, Rheumatoid Arthritis, Osteoarthritis Photos Wound Measurements Length: (cm) 5.7 Width: (cm) 1.2 Depth: (cm) 0.1 Area: (cm) 5.372 Volume: (cm) 0.537 % Reduction in Area: 0% %  Reduction in Volume: 0% Epithelialization: None Tunneling: No Undermining: No Wound Description Classification: Full Thickness Without Exposed Support Structures Exudate Amount: Medium Exudate Type: Serosanguineous Exudate Color: red, brown Foul Odor After Cleansing: No Slough/Fibrino No Wound Bed Granulation Amount: Large (67-100%) Exposed Structure Granulation Quality: Red Fascia Exposed: No Necrotic Amount: None Present (0%) Fat Layer (Subcutaneous Tissue) Exposed: Yes Tendon Exposed: No Muscle Exposed: No Joint Exposed:  No Bone Exposed: No Electronic Signature(s) Signed: 08/18/2021 4:48:21 PM By: Dolan Amen RN Entered By: Dolan Amen on 08/18/2021 13:18:54 Lindfors, [name] ([id number]) -------------------------------------------------------------------------------- Wound Assessment Details Patient Name: Chicoine, [name] Date of Service: 08/18/2021 12:45 PM Medical Record Number: [id number] Patient Account Number: [id number] Date of Birth/Sex: 1951-08-19 (69 y.o. F) Treating RN: Dolan Amen Primary Care Shelle Galdamez: Thereasa Distance Other Clinician: Referring Idonna Heeren: Referral, Self Treating Tanith Dagostino/Extender: Skipper Cliche in Treatment: 0 Wound Status Wound Number: 4 Primary Atypical Etiology: Wound Location: Left, Medial Malleolus Wound Open Wounding Event: Other Lesion Status: Date Acquired: 07/18/2021 Comorbid Cataracts, Anemia, Lymphedema, Asthma, Chronic Weeks Of Treatment: 0 History: Obstructive Pulmonary Disease (COPD), Sleep Apnea, Clustered Wound: No Arrhythmia, Coronary Artery Disease, Hypertension, History of pressure wounds, Rheumatoid Arthritis, Osteoarthritis Photos Wound Measurements Length: (cm) 0.9 Width: (cm) 1.5 Depth: (cm) 0.1 Area: (cm) 1.06 Volume: (cm) 0.106 % Reduction in Area: 0% % Reduction in Volume: 0% Epithelialization: None Tunneling: No Undermining: No Wound Description Classification: Full Thickness Without  Exposed Support Structu Exudate Amount: Medium Exudate Type: Serosanguineous Exudate Color: red, brown res Foul Odor After Cleansing: No Slough/Fibrino Yes Wound Bed Granulation Amount: Small (1-33%) Exposed Structure Granulation Quality: Red Fascia Exposed: No Necrotic Amount: Large (67-100%) Fat Layer (Subcutaneous Tissue) Exposed: Yes Necrotic Quality: Adherent Slough Tendon Exposed: No Muscle Exposed: No Joint Exposed: No Bone Exposed: No Electronic Signature(s) Signed: 08/18/2021 4:48:21 PM By: Dolan Amen RN Entered By: Dolan Amen on 08/18/2021 13:19:29 Volpi, [name] ([id number]) -------------------------------------------------------------------------------- Wound Assessment Details Patient Name: Chard, [name] Date of Service: 08/18/2021 12:45 PM Medical Record Number: [id number] Patient Account Number: [id number] Date of Birth/Sex: 1951-05-27 (69 y.o. F) Treating RN: Dolan Amen Primary Care Elliott Lasecki: Thereasa Distance Other Clinician: Referring Giavanni Zeitlin: Referral, Self Treating Aaliyana Fredericks/Extender: Skipper Cliche in Treatment: 0 Wound Status Wound Number: 5 Primary Atypical Etiology: Wound Location: Left, Lateral Lower Leg Wound Open Wounding Event: Other Lesion Status: Date Acquired: 07/18/2021 Comorbid Cataracts, Anemia, Lymphedema, Asthma, Chronic Weeks Of Treatment: 0 History: Obstructive Pulmonary Disease (COPD), Sleep Apnea, Clustered Wound: Yes Arrhythmia, Coronary Artery Disease, Hypertension, History of pressure wounds, Rheumatoid Arthritis, Osteoarthritis Photos Wound Measurements Length: (cm) 4.7 Width: (cm) 1 Depth: (cm) 0.1 Clustered Quantity: 3 Area: (cm) 3.691 Volume: (cm) 0.369 % Reduction in Area: 0% % Reduction in Volume: 0% Epithelialization: Large (67-100%) Tunneling: No Undermining: No Wound Description Classification: Full Thickness Without Exposed Support Structu Exudate Amount: Medium Exudate Type:  Serous Exudate Color: amber res Foul Odor After Cleansing: No Slough/Fibrino No Wound Bed Granulation Amount: Large (67-100%) Exposed Structure Granulation Quality: Red Fascia Exposed: No Necrotic Amount: None Present (0%) Fat Layer (Subcutaneous Tissue) Exposed: Yes Tendon Exposed: No Muscle Exposed: No Joint Exposed: No Bone Exposed: No Electronic Signature(s) Signed: 08/18/2021 4:48:21 PM By: Dolan Amen RN Entered By: Dolan Amen on 08/18/2021 13:20:19 Halvorsen, [name] ([id number]) -------------------------------------------------------------------------------- [place] Details Patient Name: Cataldo, [name] Date of Service: 08/18/2021 12:45 PM Medical Record Number: [id number] Patient Account Number: [id number] Date of Birth/Sex: 1951/07/04 (69 y.o. F) Treating RN: Dolan Amen Primary Care Oseas Detty: Thereasa Distance Other Clinician: Referring Kalli Greenfield: Referral, Self Treating Shinika Estelle/Extender: Skipper Cliche in Treatment: 0 Vital Signs Time Taken: 12:55 Temperature (F): 97.8 Height (in): 64 Pulse (bpm): 74 Source: Stated Respiratory Rate (breaths/min): 18 Weight (lbs): 183 Blood Pressure (mmHg): 120/73 Source: Stated Reference Range: 80 - 120 mg / dl Body Mass Index (BMI): 31.4 Notes pt unsteady to obtain ht/wt in office Electronic Signature(s) Signed: 08/18/2021  4:48:21 PM By: Dolan Amen RN Entered By: Dolan Amen on 08/18/2021 12:56:39

## 2021-08-19 NOTE — Progress Notes (Addendum)
[name] ([id number]) Visit Report for 08/18/2021 Chief Complaint Document Details Patient Name: [name]. Date of Service: 08/18/2021 12:45 PM Medical Record Number: [id number] Patient Account Number: [id number] Date of Birth/Sex: [birth year] (70 y.o. F) Treating RN: Dolan Amen Primary Care Provider: Thereasa Distance Other Clinician: Referring Provider: Referral, Self Treating Provider/Extender: Skipper Cliche in Treatment: 0 Information Obtained from: Patient Chief Complaint Bilateral LE Ulcer Electronic Signature(s) Signed: 08/18/2021 1:45:28 PM By: Worthy Keeler PA-C Entered By: Worthy Keeler on 08/18/2021 13:45:28 [name] ([id number]) -------------------------------------------------------------------------------- HPI Details Patient Name: [name] Date of Service: 08/18/2021 12:45 PM Medical Record Number: [id number] Patient Account Number: [id number] Date of Birth/Sex: 05-10-1951 (70 y.o. F) Treating RN: Dolan Amen Primary Care Provider: Thereasa Distance Other Clinician: Referring Provider: Referral, Self Treating Provider/Extender: Skipper Cliche in Treatment: 0 History of Present Illness HPI Description: 05/26/2021 upon evaluation today patient appears to be doing somewhat poorly in regard to wounds that she is having currently on her lower extremities. With that being said the main issue that I see simply is that she has a lot of edema noted currently. She has seen Dr. Delana Meyer she has an appointment with him within the month as well she tells me. Nonetheless her wounds are dramatically better compared to what they were just a week ago. I saw pictures and to be honest this is a whole lot better. With that being said she was stated to have had cellulitis as well although I do not see anything right now but seems to be an issue that was treated back in March. Currently they have been using Ace wraps on her legs which actually seem to be  doing quite well along with a nonstick pad. With that being said she does have quite a few other issues including some chronic kidney disease, chronic atrial fibrillation for which she is on Eliquis, hypertension, sleep apnea and she is not currently being treated for this though she states she is supposed to be, and chronic venous insufficiency along with lymphedema. 06/02/2021 upon evaluation today patient appears to be doing better in regard to her wounds. She has been tolerating the dressing changes without complication. Fortunately there is no evidence of active infection at this time which is great news. No fevers, chills, nausea, vomiting, or diarrhea. Readmission: 08/18/2021 upon evaluation today patient appears to be doing well with regard to her legs in general although she has a couple areas that are open she does seem to have some venous issues here. Fortunately there does not appear to be any signs of active infection locally nor systemically at this point she does have a few open wounds but they are very dry. The alginate is really sticking fairly hard to it. I think she may benefit from actually switching to Xeroform as it are almost completely healed. Electronic Signature(s) Signed: 08/18/2021 1:50:38 PM By: Worthy Keeler PA-C Entered By: Worthy Keeler on 08/18/2021 13:50:38 [name] ([id number]) -------------------------------------------------------------------------------- Physical Exam Details Patient Name: [name] Date of Service: 08/18/2021 12:45 PM Medical Record Number: [id number] Patient Account Number: [id number] Date of Birth/Sex: 1951-03-05 (70 y.o. F) Treating RN: Dolan Amen Primary Care Provider: Thereasa Distance Other Clinician: Referring Provider: Referral, Self Treating Provider/Extender: Skipper Cliche in Treatment: 0 Constitutional Well-nourished and well-hydrated in no acute distress. Respiratory normal breathing without  difficulty. Psychiatric this patient is able to make decisions and demonstrates good insight into disease process. Alert and Oriented x 3.  pleasant and cooperative. Notes Upon inspection patient's wound bed actually showed signs of good granulation and epithelization at this point. Fortunately there does not appear to be any signs of active infection which is great news and overall very pleased with where things stand. No fevers, chills, nausea, vomiting, or diarrhea. Electronic Signature(s) Signed: 08/18/2021 1:50:57 PM By: Worthy Keeler PA-C Entered By: Worthy Keeler on 08/18/2021 13:50:57 [name] ([id number]) -------------------------------------------------------------------------------- Physician Orders Details Patient Name: [name] Date of Service: 08/18/2021 12:45 PM Medical Record Number: [id number] Patient Account Number: [id number] Date of Birth/Sex: 08/09/1951 (70 y.o. 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 [place]: - [place] for wound care. May utilize formulary equivalent dressing for wound treatment orders  unless otherwise specified. Home Health Nurse may visit PRN to address patientos wound care needs. o Scheduled days for dressing changes to be completed; exception, patient has scheduled wound care visit that day. o **Please direct any NON-WOUND related issues/requests for orders to patient's Primary Care Physician. **If current dressing causes regression in wound condition, may D/C ordered dressing product/s and apply Normal Saline Moist Dressing daily until next [place] or Other MD appointment. **Notify [facility] of regression in wound condition at [phone]. Bathing/ Shower/ Hygiene o May shower with wound dressing protected with water repellent cover or cast protector. Wound Treatment Wound #3 - Lower Leg Wound Laterality: Right, Lateral Cleanser: Soap and Water 2 x Per Week/30 Days Discharge Instructions: Gently cleanse wound with antibacterial soap, rinse and pat dry prior to dressing wounds Peri-Wound Care: Moisturizing Lotion 2 x Per Week/30 Days Discharge Instructions: Suggestions: Theraderm, Eucerin, Cetaphil, or patient preference. Peri-Wound Care: Triamcinolone Acetonide Cream, 0.1%, 15 (g) tube 2 x Per Week/30 Days Discharge Instructions: Mix with lotion 50/50 and apply to legs Primary Dressing: Xeroform-HBD 2x2 (in/in) 2 x Per Week/30 Days Discharge Instructions: Apply Xeroform-HBD 2x2 (in/in) as directed Secondary Dressing: ABD Pad 5x9 (in/in) 2 x Per Week/30 Days Discharge Instructions: Cover with ABD pad Compression Wrap: Unna Boot 4x10 (in/yd) 2 x Per Week/30 Days Discharge Instructions: Unna Paste, Kerlix and Coban from base of toes to three finger widths below bend in knee. Wound #4 - Malleolus Wound Laterality: Left, Medial Cleanser: Soap and Water 2 x Per Week/30 Days Discharge Instructions: Gently cleanse wound with antibacterial soap, rinse and pat dry prior to dressing wounds [name], Nimco W. ([id number]) Peri-Wound Care:  Moisturizing Lotion 2 x Per Week/30 Days Discharge Instructions: Suggestions: Theraderm, Eucerin, Cetaphil, or patient preference. Peri-Wound Care: Triamcinolone Acetonide Cream, 0.1%, 15 (g) tube 2 x Per Week/30 Days Discharge Instructions: Mix with lotion 50/50 and apply to legs Primary Dressing: Xeroform-HBD 2x2 (in/in) 2 x Per Week/30 Days Discharge Instructions: Apply Xeroform-HBD 2x2 (in/in) as directed Secondary Dressing: ABD Pad 5x9 (in/in) 2 x Per Week/30  Days Discharge Instructions: Cover with ABD pad Compression Wrap: Unna Boot 4x10 (in/yd) 2 x Per Week/30 Days Discharge Instructions: Unna Paste, Kerlix and Coban from base of toes to three finger widths below bend in knee. Wound #5 - Lower Leg Wound Laterality: Left, Lateral Cleanser: Soap and Water 2 x Per Week/30 Days Discharge Instructions: Gently cleanse wound with antibacterial soap, rinse and pat dry prior to dressing wounds Peri-Wound Care: Moisturizing Lotion 2 x Per Week/30 Days Discharge Instructions: Suggestions: Theraderm, Eucerin, Cetaphil, or patient preference. Peri-Wound Care: Triamcinolone Acetonide Cream, 0.1%, 15 (g) tube 2 x Per Week/30 Days Discharge Instructions: Mix with lotion 50/50 and apply to legs Primary Dressing: Xeroform-HBD 2x2 (in/in) 2 x Per Week/30 Days Discharge Instructions: Apply Xeroform-HBD 2x2 (in/in) as directed Secondary Dressing: ABD Pad 5x9 (in/in) 2 x Per Week/30 Days Discharge Instructions: Cover with ABD pad Compression Wrap: Unna Boot 4x10 (in/yd) 2 x Per Week/30 Days Discharge Instructions: Unna Paste, Kerlix and Coban from base of toes to three finger widths below bend in knee. Patient Medications Allergies: Augmentin, Sulfa (Sulfonamide Antibiotics) Notifications Medication Indication Start End triamcinolone acetonide 08/19/2021 DOSE topical 0.1 % ointment - ointment topical applied with each dressing/wrap change to the leg. Electronic Signature(s) Signed: 08/19/2021 4:28:22 PM  By: Worthy Keeler PA-C Previous Signature: 08/18/2021 4:48:21 PM Version By: Dolan Amen RN Previous Signature: 08/18/2021 5:15:45 PM Version By: Worthy Keeler PA-C Entered By: Worthy Keeler on 08/19/2021 [id number] [name] ([id number]) -------------------------------------------------------------------------------- Problem List Details Patient Name: [name] Date of Service: 08/18/2021 12:45 PM Medical Record Number: [id number] Patient Account Number: [id number] Date of Birth/Sex: Mar 05, 1951 (69 y.o. F) Treating RN: Dolan Amen Primary Care Provider: Thereasa Distance Other Clinician: Referring Provider: Referral, Self Treating Provider/Extender: Skipper Cliche in Treatment: 0 Active Problems ICD-10 Encounter Code Description Active Date MDM Diagnosis I87.2 Venous insufficiency (chronic) (peripheral) 08/18/2021 No Yes I89.0 Lymphedema, not elsewhere classified 08/18/2021 No Yes L97.822 Non-pressure chronic ulcer of other part of left lower leg with fat layer 08/18/2021 No Yes exposed L97.812 Non-pressure chronic ulcer of other part of right lower leg with fat layer 08/18/2021 No Yes exposed L97.322 Non-pressure chronic ulcer of left ankle with fat layer exposed 08/18/2021 No Yes I48.0 Paroxysmal atrial fibrillation 08/18/2021 No Yes Z79.01 Long term (current) use of anticoagulants 08/18/2021 No Yes G47.30 Sleep apnea, unspecified 08/18/2021 No Yes I10 Essential (primary) hypertension 08/18/2021 No Yes L84 Corns and callosities 08/18/2021 No Yes Inactive Problems Resolved Problems Electronic Signature(s) Signed: 08/18/2021 1:45:16 PM By: Worthy Keeler PA-C Entered By: Worthy Keeler on 08/18/2021 13:45:16 [name] ([id number]) [name] ([id number]) -------------------------------------------------------------------------------- Progress Note Details Patient Name: Tippett, [name] Date of Service: 08/18/2021 12:45 PM Medical Record Number: [id number] Patient  Account Number: [id number] Date of Birth/Sex: 1951/07/17 (69 y.o. F) Treating RN: Dolan Amen Primary Care Provider: Thereasa Distance Other Clinician: Referring Provider: Referral, Self Treating Provider/Extender: Skipper Cliche in Treatment: 0 Subjective Chief Complaint Information obtained from Patient Bilateral LE Ulcer History of Present Illness (HPI) 05/26/2021 upon evaluation today patient appears to be doing somewhat poorly in regard to wounds that she is having currently on her lower extremities. With that being said the main issue that I see simply is that she has a lot of edema noted currently. She has seen Dr. Delana Meyer she has an appointment with him within the month as well she tells me. Nonetheless her wounds are dramatically better compared to what they were just a week  ago. I saw pictures and to be honest this is a whole lot better. With that being said she was stated to have had cellulitis as well although I do not see anything right now but seems to be an issue that was treated back in March. Currently they have been using Ace wraps on her legs which actually seem to be doing quite well along with a nonstick pad. With that being said she does have quite a few other issues including some chronic kidney disease, chronic atrial fibrillation for which she is on Eliquis, hypertension, sleep apnea and she is not currently being treated for this though she states she is supposed to be, and chronic venous insufficiency along with lymphedema. 06/02/2021 upon evaluation today patient appears to be doing better in regard to her wounds. She has been tolerating the dressing changes without complication. Fortunately there is no evidence of active infection at this time which is great news. No fevers, chills, nausea, vomiting, or diarrhea. Readmission: 08/18/2021 upon evaluation today patient appears to be doing well with regard to her legs in general although she has a couple areas that are  open she does seem to have some venous issues here. Fortunately there does not appear to be any signs of active infection locally nor systemically at this point she does have a few open wounds but they are very dry. The alginate is really sticking fairly hard to it. I think she may benefit from actually switching to Xeroform as it are almost completely healed. Patient History Information obtained from Patient. Allergies Augmentin (Severity: Moderate, Reaction: rash), Sulfa (Sulfonamide Antibiotics) (Severity: Moderate, Reaction: itching) Social History Never smoker, Marital Status - Divorced, Alcohol Use - Never, Drug Use - No History, Caffeine Use - Rarely. Medical History Eyes Patient has history of Cataracts Hematologic/Lymphatic Patient has history of Anemia, Lymphedema Respiratory Patient has history of Asthma, Chronic Obstructive Pulmonary Disease (COPD), Sleep Apnea Cardiovascular Patient has history of Arrhythmia - afib, Coronary Artery Disease, Hypertension Endocrine Denies history of Type II Diabetes Integumentary (Skin) Patient has history of History of pressure wounds Musculoskeletal Patient has history of Rheumatoid Arthritis, Osteoarthritis Hospitalization/Surgery History - left foot surgery. - cholecystectomy. - cesarean. Review of Systems (ROS) Eyes Denies complaints or symptoms of Dry Eyes, Vision Changes, Glasses / Contacts. Ear/Nose/Mouth/Throat Denies complaints or symptoms of Difficult clearing ears, Sinusitis. Hematologic/Lymphatic Denies complaints or symptoms of Bleeding / Clotting Disorders, Human Immunodeficiency Virus. Respiratory Denies complaints or symptoms of Chronic or frequent coughs, Shortness of Breath. [place], [name] ([id number]) Cardiovascular Denies complaints or symptoms of Chest pain, LE edema. Gastrointestinal Denies complaints or symptoms of Frequent diarrhea, Nausea, Vomiting. Endocrine Denies complaints or symptoms of Hepatitis,  Thyroid disease, Polydypsia (Excessive Thirst). Genitourinary Denies complaints or symptoms of Kidney failure/ Dialysis, Incontinence/dribbling. Immunological Denies complaints or symptoms of Hives, Itching. Neurologic Denies complaints or symptoms of Numbness/parasthesias, Focal/Weakness. Psychiatric Denies complaints or symptoms of Anxiety, Claustrophobia. Objective Constitutional Well-nourished and well-hydrated in no acute distress. Vitals Time Taken: 12:55 PM, Height: 64 in, Source: Stated, Weight: 183 lbs, Source: Stated, BMI: 31.4, Temperature: 97.8 F, Pulse: 74 bpm, Respiratory Rate: 18 breaths/min, Blood Pressure: 120/73 mmHg. General Notes: pt unsteady to obtain ht/wt in office Respiratory normal breathing without difficulty. Psychiatric this patient is able to make decisions and demonstrates good insight into disease process. Alert and Oriented x 3. pleasant and cooperative. General Notes: Upon inspection patient's wound bed actually showed signs of good granulation and epithelization at this point. Fortunately there does not appear  to be any signs of active infection which is great news and overall very pleased with where things stand. No fevers, chills, nausea, vomiting, or diarrhea. Integumentary (Hair, Skin) Wound #3 status is Open. Original cause of wound was Gradually Appeared. The date acquired was: 08/04/2021. The wound is located on the Right,Lateral Lower Leg. The wound measures 5.7cm length x 1.2cm width x 0.1cm depth; 5.372cm^2 area and 0.537cm^3 volume. There is Fat Layer (Subcutaneous Tissue) exposed. There is no tunneling or undermining noted. There is a medium amount of serosanguineous drainage noted. There is large (67-100%) red granulation within the wound bed. There is no necrotic tissue within the wound bed. Wound #4 status is Open. Original cause of wound was Other Lesion. The date acquired was: 07/18/2021. The wound is located on the Left,Medial Malleolus.  The wound measures 0.9cm length x 1.5cm width x 0.1cm depth; 1.06cm^2 area and 0.106cm^3 volume. There is Fat Layer (Subcutaneous Tissue) exposed. There is no tunneling or undermining noted. There is a medium amount of serosanguineous drainage noted. There is small (1-33%) red granulation within the wound bed. There is a large (67-100%) amount of necrotic tissue within the wound bed including Adherent Slough. Wound #5 status is Open. Original cause of wound was Other Lesion. The date acquired was: 07/18/2021. The wound is located on the Left,Lateral Lower Leg. The wound measures 4.7cm length x 1cm width x 0.1cm depth; 3.691cm^2 area and 0.369cm^3 volume. There is Fat Layer (Subcutaneous Tissue) exposed. There is no tunneling or undermining noted. There is a medium amount of serous drainage noted. There is large (67-100%) red granulation within the wound bed. There is no necrotic tissue within the wound bed. 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 ([id number]) Essential (primary) hypertension Corns and callosities Plan 1. Would recommend currently that we actually switch to Xeroform gauze as the dressing of choice I think this is good to be ideal. 2. I am also can recommend that we go ahead and get the patient set up for having an Unna boot changed twice a week with home health she already has a home health nurse which is great. I am also can get her a prescription for the triamcinolone to be applied mixed half-and-half and then applied to the legs bilaterally before each wrap  change. 3. I am also can recommend that the patient continue to elevate her legs much as possible and use her lymphedema pumps help keep edema under good control. We will see patient back for reevaluation in 1 week here in the clinic. If anything worsens or changes patient will contact our office for additional recommendations. Electronic Signature(s) Signed: 08/18/2021 1:51:34 PM By: Worthy Keeler PA-C Entered By: Worthy Keeler on 08/18/2021 13:51:34 [name] ([id number]) -------------------------------------------------------------------------------- ROS/PFSH Details Patient Name: [name] Date of Service: 08/18/2021 12:45 PM Medical Record Number: [id number] Patient Account Number: [id number] Date of Birth/Sex: 08-May-1951 (69 y.o. F) Treating RN: Dolan Amen Primary Care Provider: Thereasa Distance Other Clinician: Referring Provider: Referral, Self Treating Provider/Extender: Skipper Cliche in Treatment: 0 Information Obtained From Patient Eyes Complaints and Symptoms: Negative for: Dry Eyes; Vision Changes; Glasses / Contacts Medical History:  Positive for: Cataracts Ear/Nose/Mouth/Throat Complaints and Symptoms: Negative for: Difficult clearing ears; Sinusitis Hematologic/Lymphatic Complaints and Symptoms: Negative for: Bleeding / Clotting Disorders; Human Immunodeficiency Virus Medical History: Positive for: Anemia; Lymphedema Respiratory Complaints and Symptoms: Negative for: Chronic or frequent coughs; Shortness of Breath Medical History: Positive for: Asthma; Chronic Obstructive Pulmonary Disease (COPD); Sleep Apnea Cardiovascular Complaints and Symptoms: Negative for: Chest pain; LE edema Medical History: Positive for: Arrhythmia - afib; Coronary Artery Disease; Hypertension Gastrointestinal Complaints and Symptoms: Negative for: Frequent diarrhea; Nausea; Vomiting Endocrine Complaints and Symptoms: Negative for: Hepatitis; Thyroid  disease; Polydypsia (Excessive Thirst) Medical History: Negative for: Type II Diabetes Genitourinary Complaints and Symptoms: Negative for: Kidney failure/ Dialysis; Incontinence/dribbling Immunological [name], DESTINIE DONA. ([id number]) Complaints and Symptoms: Negative for: Hives; Itching Neurologic Complaints and Symptoms: Negative for: Numbness/parasthesias; Focal/Weakness Psychiatric Complaints and Symptoms: Negative for: Anxiety; Claustrophobia Integumentary (Skin) Medical History: Positive for: History of pressure wounds Musculoskeletal Medical History: Positive for: Rheumatoid Arthritis; Osteoarthritis Oncologic HBO Extended History Items Eyes: Cataracts Immunizations Pneumococcal Vaccine: Received Pneumococcal Vaccination: Yes Received Pneumococcal Vaccination On or After 60th Birthday: Yes Implantable Devices None Hospitalization / Surgery History Type of Hospitalization/Surgery left foot surgery cholecystectomy cesarean Family and Social History Never smoker; Marital Status - Divorced; Alcohol Use: Never; Drug Use: No History; Caffeine Use: Rarely; Financial Concerns: No; Food, Clothing or Shelter Needs: No; Support System Lacking: No; Transportation Concerns: No Electronic Signature(s) Signed: 08/18/2021 4:48:21 PM By: Dolan Amen RN Signed: 08/18/2021 5:15:45 PM By: Worthy Keeler PA-C Entered By: Dolan Amen on 08/18/2021 12:59:29 Incor[place]ia, [name] ([id number]) -------------------------------------------------------------------------------- SuperBill Details Patient Name: Talamantez, [name] Date of Service: 08/18/2021 Medical Record Number: [id number] Patient Account Number: [id number] Date of Birth/Sex: 1951/08/11 (69 y.o. 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.322 Non-pressure chronic ulcer of left ankle with fat layer exposed I48.0 Paroxysmal atrial fibrillation Z79.01 Long term (current) use of anticoagulants G47.30 Sleep apnea, unspecified I10 Essential (primary) hypertension L84 Corns and callosities Physician Procedures CPT4 Code: [id number] Description: [id number] - WC PHYS LEVEL 4 - EST PT Modifier: Quantity: 1 CPT4 Code: Description: ICD-10 Diagnosis Description I87.2 Venous insufficiency (chronic) (peripheral) I89.0 Lymphedema, not elsewhere classified L97.822 Non-pressure chronic ulcer of other part of left lower leg with fat lay L97.812 Non-pressure chronic ulcer of  other part of right lower leg with fat la Modifier: er exposed yer exposed Quantity: Electronic Signature(s) Signed: 08/18/2021 1:51:53 PM By: Worthy Keeler PA-C Entered By: Worthy Keeler on 08/18/2021 13:51:53

## 2021-09-01 ENCOUNTER — Encounter: Payer: Medicare Other | Admitting: Physician Assistant

## 2021-09-01 ENCOUNTER — Other Ambulatory Visit: Payer: Self-pay

## 2021-09-01 DIAGNOSIS — I872 Venous insufficiency (chronic) (peripheral): Secondary | ICD-10-CM | POA: Diagnosis not present

## 2021-09-05 NOTE — Progress Notes (Signed)
Bartell, [name] ([id number]) Visit Report for 09/01/2021 Arrival Information Details Patient Name: [name], [name]. Date of Service: 09/01/2021 11:00 AM Medical Record Number: [id number] Patient Account Number: [id number] Date of Birth/Sex: Jun 23, 1951 (69 y.o. F) Treating RN: Carlene Coria Primary Care Roniya Tetro: Thereasa Distance Other Clinician: Referring Venson Ferencz: Thereasa Distance Treating Shamiracle Gorden/Extender: Skipper Cliche in Treatment: 2 Visit Information History Since Last Visit All ordered tests and consults were completed: No Patient Arrived: Wheel Chair Added or deleted any medications: No Arrival Time: 11:14 Any new allergies or adverse reactions: No Accompanied By: self Had a fall or experienced change in No Transfer Assistance: None activities of daily living that may affect Patient Identification Verified: Yes risk of falls: Secondary Verification Process Completed: Yes Signs or symptoms of abuse/neglect since last visito No Patient Requires Transmission-Based No Hospitalized since last visit: No Precautions: Implantable device outside of the clinic excluding No Patient Has Alerts: Yes cellular tissue based products placed in the center Patient Alerts: Patient on Blood since last visit: Thinner Has Dressing in Place as Prescribed: Yes ***ELIQUIS*** Has Compression in Place as Prescribed: Yes Pain Present Now: No Electronic Signature(s) Signed: 09/05/2021 7:55:24 AM By: Carlene Coria RN Entered By: Carlene Coria on 09/01/2021 11:14:24 Donna, [name] ([id number]) -------------------------------------------------------------------------------- Clinic Level of Care Assessment Details Patient Name: Vaquera, [name] Date of Service: 09/01/2021 11:00 AM Medical Record Number: [id number] Patient Account Number: [id number] Date of Birth/Sex: 12-06-1951 (69 y.o. F) Treating RN: Carlene Coria Primary Care Tahjae Durr: Thereasa Distance Other Clinician: Referring Denea Cheaney:  Thereasa Distance Treating Grayland Daisey/Extender: Skipper Cliche in Treatment: 2 Clinic Level of Care Assessment Items TOOL 4 Quantity Score X - Use when only an EandM is performed on FOLLOW-UP visit 1 0 ASSESSMENTS - Nursing Assessment / Reassessment X - Reassessment of Co-morbidities (includes updates in patient status) 1 10 X- 1 5 Reassessment of Adherence to Treatment Plan ASSESSMENTS - Wound and Skin Assessment / Reassessment '[]'$  - Simple Wound Assessment / Reassessment - one wound 0 X- 3 5 Complex Wound Assessment / Reassessment - multiple wounds '[]'$  - 0 Dermatologic / Skin Assessment (not related to wound area) ASSESSMENTS - Focused Assessment '[]'$  - Circumferential Edema Measurements - multi extremities 0 '[]'$  - 0 Nutritional Assessment / Counseling / Intervention '[]'$  - 0 Lower Extremity Assessment (monofilament, tuning fork, pulses) '[]'$  - 0 Peripheral Arterial Disease Assessment (using hand held doppler) ASSESSMENTS - Ostomy and/or Continence Assessment and Care '[]'$  - Incontinence Assessment and Management 0 '[]'$  - 0 Ostomy Care Assessment and Management (repouching, etc.) PROCESS - Coordination of Care X - Simple Patient / Family Education for ongoing care 1 15 '[]'$  - 0 Complex (extensive) Patient / Family Education for ongoing care '[]'$  - 0 Staff obtains Programmer, systems, Records, Test Results / Process Orders '[]'$  - 0 Staff telephones HHA, Nursing Homes / Clarify orders / etc '[]'$  - 0 Routine Transfer to another Facility (non-emergent condition) '[]'$  - 0 Routine Hospital Admission (non-emergent condition) '[]'$  - 0 New Admissions / Biomedical engineer / Ordering NPWT, Apligraf, etc. '[]'$  - 0 Emergency Hospital Admission (emergent condition) X- 1 10 Simple Discharge Coordination '[]'$  - 0 Complex (extensive) Discharge Coordination PROCESS - Special Needs '[]'$  - Pediatric / Minor Patient Management 0 '[]'$  - 0 Isolation Patient Management '[]'$  - 0 Hearing / Language / Visual special needs '[]'$  -  0 Assessment of Community assistance (transportation, D/C planning, etc.) '[]'$  - 0 Additional assistance / Altered mentation '[]'$  - 0 Support Surface(s) Assessment (bed, cushion, seat, etc.) INTERVENTIONS - Wound  Cleansing / Measurement Opara, Loyda W. ([id number]) '[]'$  - 0 Simple Wound Cleansing - one wound '[]'$  - 0 Complex Wound Cleansing - multiple wounds X- 1 5 Wound Imaging (photographs - any number of wounds) '[]'$  - 0 Wound Tracing (instead of photographs) '[]'$  - 0 Simple Wound Measurement - one wound X- 3 5 Complex Wound Measurement - multiple wounds INTERVENTIONS - Wound Dressings '[]'$  - Small Wound Dressing one or multiple wounds 0 '[]'$  - 0 Medium Wound Dressing one or multiple wounds '[]'$  - 0 Large Wound Dressing one or multiple wounds '[]'$  - 0 Application of Medications - topical '[]'$  - 0 Application of Medications - injection INTERVENTIONS - Miscellaneous '[]'$  - External ear exam 0 '[]'$  - 0 Specimen Collection (cultures, biopsies, blood, body fluids, etc.) '[]'$  - 0 Specimen(s) / Culture(s) sent or taken to Lab for analysis '[]'$  - 0 Patient Transfer (multiple staff / Civil Service fast streamer / Similar devices) '[]'$  - 0 Simple Staple / Suture removal (25 or less) '[]'$  - 0 Complex Staple / Suture removal (26 or more) '[]'$  - 0 Hypo / Hyperglycemic Management (close monitor of Blood Glucose) '[]'$  - 0 Ankle / Brachial Index (ABI) - do not check if billed separately X- 1 5 Vital Signs Has the patient been seen at the hospital within the last three years: Yes Total Score: 80 Level Of Care: New/Established - Level 3 Electronic Signature(s) Signed: 09/05/2021 7:55:24 AM By: Carlene Coria RN Entered By: Carlene Coria on 09/01/2021 11:56:03 Steers, [name] ([id number]) -------------------------------------------------------------------------------- Encounter Discharge Information Details Patient Name: Pistole, [name] Date of Service: 09/01/2021 11:00 AM Medical Record Number: [id number] Patient Account Number:  [id number] Date of Birth/Sex: 1951-07-05 (69 y.o. F) Treating RN: Carlene Coria Primary Care Maia Handa: Thereasa Distance Other Clinician: Referring Anthon Harpole: Thereasa Distance Treating Addisson Frate/Extender: Skipper Cliche in Treatment: 2 Encounter Discharge Information Items Discharge Condition: Stable Ambulatory Status: Wheelchair Discharge Destination: Home Transportation: Private Auto Accompanied By: self Schedule Follow-up Appointment: Yes Clinical Summary of Care: Patient Declined Electronic Signature(s) Signed: 09/05/2021 7:55:24 AM By: Carlene Coria RN Entered By: Carlene Coria on 09/01/2021 11:57:02 Reine, [name] ([id number]) -------------------------------------------------------------------------------- Lower Extremity Assessment Details Patient Name: Fugett, [name] Date of Service: 09/01/2021 11:00 AM Medical Record Number: [id number] Patient Account Number: [id number] Date of Birth/Sex: 31-Mar-1951 (69 y.o. F) Treating RN: Carlene Coria Primary Care Thaily Hackworth: Thereasa Distance Other Clinician: Referring Delbra Zellars: Thereasa Distance Treating Vicktoria Muckey/Extender: Skipper Cliche in Treatment: 2 Electronic Signature(s) Signed: 09/05/2021 7:55:24 AM By: Carlene Coria RN Entered By: Carlene Coria on 09/01/2021 11:53:24 Cheong, [name] ([id number]) -------------------------------------------------------------------------------- Multi Wound Chart Details Patient Name: [name] Date of Service: 09/01/2021 11:00 AM Medical Record Number: [id number] Patient Account Number: [id number] Date of Birth/Sex: 1951-07-19 (69 y.o. F) Treating RN: Carlene Coria Primary Care Chrles Selley: Thereasa Distance Other Clinician: Referring Jameriah Trotti: Thereasa Distance Treating [place] Korver/Extender: Skipper Cliche in Treatment: 2 Vital Signs Height(in): 5 Pulse(bpm): 57 Weight(lbs): 183 Blood Pressure(mmHg): 125/73 Body Mass Index(BMI): 31 Temperature(F): 97.8 Respiratory Rate(breaths/min):  18 Photos: Wound Location: Right, Lateral Lower Leg Left, Medial Malleolus Left, Lateral Lower Leg Wounding Event: Gradually Appeared Other Lesion Other Lesion Primary Etiology: Venous Leg Ulcer Venous Leg Ulcer Venous Leg Ulcer Comorbid History: Cataracts, Anemia, Lymphedema, Cataracts, Anemia, Lymphedema, Cataracts, Anemia, Lymphedema, Asthma, Chronic Obstructive Asthma, Chronic Obstructive Asthma, Chronic Obstructive Pulmonary Disease (COPD), Sleep Pulmonary Disease (COPD), Sleep Pulmonary Disease (COPD), Sleep Apnea, Arrhythmia, Coronary Artery Apnea, Arrhythmia, Coronary Artery Apnea, Arrhythmia, Coronary Artery Disease, Hypertension, History of Disease, Hypertension, History of Disease, Hypertension, History  of pressure wounds, Rheumatoid pressure wounds, Rheumatoid pressure wounds, Rheumatoid Arthritis, Osteoarthritis Arthritis, Osteoarthritis Arthritis, Osteoarthritis Date Acquired: 08/04/2021 07/18/2021 07/18/2021 Weeks of Treatment: '2 2 2 '$ Wound Status: Open Open Open Clustered Wound: No No Yes Clustered Quantity: N/A N/A 3 Measurements L x W x D (cm) 0x0x0 0x0x0 0x0x0 Area (cm) : 0 0 0 Volume (cm) : 0 0 0 % Reduction in Area: 100.00% 100.00% 100.00% % Reduction in Volume: 100.00% 100.00% 100.00% Classification: Full Thickness Without Exposed Full Thickness Without Exposed Full Thickness Without Exposed Support Structures Support Structures Support Structures Exudate Amount: None Present None Present None Present Granulation Amount: None Present (0%) None Present (0%) None Present (0%) Necrotic Amount: None Present (0%) None Present (0%) None Present (0%) Exposed Structures: Fascia: No Fascia: No Fascia: No Fat Layer (Subcutaneous Tissue): Fat Layer (Subcutaneous Tissue): Fat Layer (Subcutaneous Tissue): No No No Tendon: No Tendon: No Tendon: No Muscle: No Muscle: No Muscle: No Joint: No Joint: No Joint: No Bone: No Bone: No Bone: No Epithelialization: None Large  (67-100%) Large (67-100%) Treatment Notes Electronic Signature(s) Signed: 09/05/2021 7:55:24 AM By: Carlene Coria RN Entered By: Carlene Coria on 09/01/2021 11:54:06 Carneal, [name] ([id number]) Shahan, [name] ([id number]) -------------------------------------------------------------------------------- [place] Details Patient Name: Snee, [name] Date of Service: 09/01/2021 11:00 AM Medical Record Number: [id number] Patient Account Number: [id number] Date of Birth/Sex: 1951-06-06 (69 y.o. F) Treating RN: Carlene Coria Primary Care Tyshae Stair: Thereasa Distance Other Clinician: Referring Ia Leeb: Thereasa Distance Treating Braelynne Garinger/Extender: Skipper Cliche in Treatment: 2 Active Inactive Electronic Signature(s) Signed: 09/05/2021 7:55:24 AM By: Carlene Coria RN Entered By: Carlene Coria on 09/01/2021 11:53:44 Getter, [name] ([id number]) -------------------------------------------------------------------------------- Pain Assessment Details Patient Name: [name] Date of Service: 09/01/2021 11:00 AM Medical Record Number: [id number] Patient Account Number: [id number] Date of Birth/Sex: 06/30/1951 (69 y.o. F) Treating RN: Carlene Coria Primary Care Genesee Nase: Thereasa Distance Other Clinician: Referring Demetrias Goodbar: Thereasa Distance Treating Chancellor Vanderloop/Extender: Skipper Cliche in Treatment: 2 Active Problems Location of Pain Severity and Description of Pain Patient Has Paino No Site Locations Pain Management and Medication Current Pain Management: Electronic Signature(s) Signed: 09/05/2021 7:55:24 AM By: Carlene Coria RN Entered By: Carlene Coria on 09/01/2021 11:15:07 Wence, [name] ([id number]) -------------------------------------------------------------------------------- Patient/Caregiver Education Details Patient Name: [name] Date of Service: 09/01/2021 11:00 AM Medical Record Number: [id number] Patient Account Number: [id number] Date of  Birth/Gender: 24-Mar-1951 (69 y.o. F) Treating RN: Carlene Coria Primary Care Physician: Thereasa Distance Other Clinician: Referring Physician: Thereasa Distance Treating Physician/Extender: Skipper Cliche in Treatment: 2 Education Assessment Education Provided To: Patient Education Topics Provided Wound/Skin Impairment: Methods: Explain/Verbal Responses: State content correctly Electronic Signature(s) Signed: 09/05/2021 7:55:24 AM By: Carlene Coria RN Entered By: Carlene Coria on 09/01/2021 11:56:26 Stimmel, [name] ([id number]) -------------------------------------------------------------------------------- Wound Assessment Details Patient Name: [name] Date of Service: 09/01/2021 11:00 AM Medical Record Number: [id number] Patient Account Number: [id number] Date of Birth/Sex: 09-Dec-1951 (69 y.o. F) Treating RN: Carlene Coria Primary Care Vishal Sandlin: Thereasa Distance Other Clinician: Referring Ellana Kawa: Thereasa Distance Treating Davian Wollenberg/Extender: Skipper Cliche in Treatment: 2 Wound Status Wound Number: 3 Primary Venous Leg Ulcer Etiology: Wound Location: Right, Lateral Lower Leg Wound Open Wounding Event: Gradually Appeared Status: Date Acquired: 08/04/2021 Comorbid Cataracts, Anemia, Lymphedema, Asthma, Chronic Weeks Of Treatment: 2 History: Obstructive Pulmonary Disease (COPD), Sleep Apnea, Clustered Wound: No Arrhythmia, Coronary Artery Disease, Hypertension, History of pressure wounds, Rheumatoid Arthritis, Osteoarthritis Photos Wound Measurements Length: (cm) 0 Width: (cm) 0 Depth: (cm) 0 Area: (cm) 0  Volume: (cm) 0 % Reduction in Area: 100% % Reduction in Volume: 100% Epithelialization: None Tunneling: No Undermining: No Wound Description Classification: Full Thickness Without Exposed Support Structures Exudate Amount: None Present Foul Odor After Cleansing: No Slough/Fibrino No Wound Bed Granulation Amount: None Present (0%) Exposed  Structure Necrotic Amount: None Present (0%) Fascia Exposed: No Fat Layer (Subcutaneous Tissue) Exposed: No Tendon Exposed: No Muscle Exposed: No Joint Exposed: No Bone Exposed: No Electronic Signature(s) Signed: 09/05/2021 7:55:24 AM By: Carlene Coria RN Entered By: Carlene Coria on 09/01/2021 11:50:15 Greenwood, [name] ([id number]) -------------------------------------------------------------------------------- Wound Assessment Details Patient Name: Piehl, [name] Date of Service: 09/01/2021 11:00 AM Medical Record Number: [id number] Patient Account Number: [id number] Date of Birth/Sex: [birth year] (69 y.o. F) Treating RN: Carlene Coria Primary Care Kazmir Oki: Thereasa Distance Other Clinician: Referring Challis Crill: Thereasa Distance Treating Jex Strausbaugh/Extender: Skipper Cliche in Treatment: 2 Wound Status Wound Number: 4 Primary Venous Leg Ulcer Etiology: Wound Location: Left, Medial Malleolus Wound Open Wounding Event: Other Lesion Status: Date Acquired: 07/18/2021 Comorbid Cataracts, Anemia, Lymphedema, Asthma, Chronic Weeks Of Treatment: 2 History: Obstructive Pulmonary Disease (COPD), Sleep Apnea, Clustered Wound: No Arrhythmia, Coronary Artery Disease, Hypertension, History of pressure wounds, Rheumatoid Arthritis, Osteoarthritis Photos Wound Measurements Length: (cm) 0 Width: (cm) 0 Depth: (cm) 0 Area: (cm) Volume: (cm) % Reduction in Area: 100% % Reduction in Volume: 100% Epithelialization: Large (67-100%) 0 Tunneling: No 0 Undermining: No Wound Description Classification: Full Thickness Without Exposed Support Structures Exudate Amount: None Present Foul Odor After Cleansing: No Slough/Fibrino No Wound Bed Granulation Amount: None Present (0%) Exposed Structure Necrotic Amount: None Present (0%) Fascia Exposed: No Fat Layer (Subcutaneous Tissue) Exposed: No Tendon Exposed: No Muscle Exposed: No Joint Exposed: No Bone Exposed: No Electronic  Signature(s) Signed: 09/05/2021 7:55:24 AM By: Carlene Coria RN Entered By: Carlene Coria on 09/01/2021 11:51:20 [place], [name] ([id number]) -------------------------------------------------------------------------------- Wound Assessment Details Patient Name: Moskowitz, [name] Date of Service: 09/01/2021 11:00 AM Medical Record Number: [id number] Patient Account Number: [id number] Date of Birth/Sex: Oct 20, 1951 (69 y.o. F) Treating RN: Carlene Coria Primary Care Jsaon Yoo: Thereasa Distance Other Clinician: Referring Bralynn Velador: Thereasa Distance Treating Emet Rafanan/Extender: Skipper Cliche in Treatment: 2 Wound Status Wound Number: 5 Primary Venous Leg Ulcer Etiology: Wound Location: Left, Lateral Lower Leg Wound Open Wounding Event: Other Lesion Status: Date Acquired: 07/18/2021 Comorbid Cataracts, Anemia, Lymphedema, Asthma, Chronic Weeks Of Treatment: 2 History: Obstructive Pulmonary Disease (COPD), Sleep Apnea, Clustered Wound: Yes Arrhythmia, Coronary Artery Disease, Hypertension, History of pressure wounds, Rheumatoid Arthritis, Osteoarthritis Photos Wound Measurements Length: (cm) Width: (cm) Depth: (cm) Clustered Quantity: Area: (cm) Volume: (cm) 0 % Reduction in Area: 100% 0 % Reduction in Volume: 100% 0 Epithelialization: Large (67-100%) 3 Tunneling: No 0 Undermining: No 0 Wound Description Classification: Full Thickness Without Exposed Support Structures Exudate Amount: None Present Foul Odor After Cleansing: No Slough/Fibrino No Wound Bed Granulation Amount: None Present (0%) Exposed Structure Necrotic Amount: None Present (0%) Fascia Exposed: No Fat Layer (Subcutaneous Tissue) Exposed: No Tendon Exposed: No Muscle Exposed: No Joint Exposed: No Bone Exposed: No Electronic Signature(s) Signed: 09/05/2021 7:55:24 AM By: Carlene Coria RN Entered By: Carlene Coria on 09/01/2021 11:52:29 Blass, [name]  ([id number]) -------------------------------------------------------------------------------- [place] Details Patient Name: Behrmann, [name] Date of Service: 09/01/2021 11:00 AM Medical Record Number: [id number] Patient Account Number: [id number] Date of Birth/Sex: 12/23/1950 (69 y.o. F) Treating RN: Carlene Coria Primary Care Quincey Quesinberry: Thereasa Distance Other Clinician: Referring Italo Banton: Thereasa Distance Treating Aki Abalos/Extender: Skipper Cliche in Treatment: 2 Vital Signs Time Taken:  11:14 Temperature (F): 97.8 Height (in): 64 Pulse (bpm): 74 Weight (lbs): 183 Respiratory Rate (breaths/min): 18 Body Mass Index (BMI): 31.4 Blood Pressure (mmHg): 125/73 Reference Range: 80 - 120 mg / dl Electronic Signature(s) Signed: 09/05/2021 7:55:24 AM By: Carlene Coria RN Entered By: Carlene Coria on 09/01/2021 11:14:55

## 2021-09-05 NOTE — Progress Notes (Signed)
Forrester, [name] ([id number]) Visit Report for 09/01/2021 Chief Complaint Document Details Patient Name: [name], [name] Date of Service: 09/01/2021 11:00 AM Medical Record Number: [id number] Patient Account Number: [id number] Date of Birth/Sex: 01/12/1951 (70 y.o. F) Treating RN: Carlene Coria Primary Care Provider: Thereasa Distance Other Clinician: Referring Provider: Thereasa Distance Treating Provider/Extender: Skipper Cliche in Treatment: 2 Information Obtained from: Patient Chief Complaint Bilateral LE Ulcer Electronic Signature(s) Signed: 09/01/2021 11:07:58 AM By: Worthy Keeler PA-C Entered By: Worthy Keeler on 09/01/2021 11:07:58 [name] ([id number]) -------------------------------------------------------------------------------- HPI Details Patient Name: [name] Date of Service: 09/01/2021 11:00 AM Medical Record Number: [id number] Patient Account Number: [id number] Date of Birth/Sex: [birth year] (70 y.o. F) Treating RN: Carlene Coria Primary Care Provider: Thereasa Distance Other Clinician: Referring Provider: Thereasa Distance Treating Provider/Extender: Skipper Cliche in Treatment: 2 History of Present Illness HPI Description: 05/26/2021 upon evaluation today patient appears to be doing somewhat poorly in regard to wounds that she is having currently on her lower extremities. With that being said the main issue that I see simply is that she has a lot of edema noted currently. She has seen Dr. Delana Meyer she has an appointment with him within the month as well she tells me. Nonetheless her wounds are dramatically better compared to what they were just a week ago. I saw pictures and to be honest this is a whole lot better. With that being said she was stated to have had cellulitis as well although I do not see anything right now but seems to be an issue that was treated back in March. Currently they have been using Ace wraps on her legs which actually seem to be  doing quite well along with a nonstick pad. With that being said she does have quite a few other issues including some chronic kidney disease, chronic atrial fibrillation for which she is on Eliquis, hypertension, sleep apnea and she is not currently being treated for this though she states she is supposed to be, and chronic venous insufficiency along with lymphedema. 06/02/2021 upon evaluation today patient appears to be doing better in regard to her wounds. She has been tolerating the dressing changes without complication. Fortunately there is no evidence of active infection at this time which is great news. No fevers, chills, nausea, vomiting, or diarrhea. Readmission: 08/18/2021 upon evaluation today patient appears to be doing well with regard to her legs in general although she has a couple areas that are open she does seem to have some venous issues here. Fortunately there does not appear to be any signs of active infection locally nor systemically at this point she does have a few open wounds but they are very dry. The alginate is really sticking fairly hard to it. I think she may benefit from actually switching to Xeroform as it are almost completely healed. 09/01/2021 upon evaluation today patient appears to be doing well with regard to her wounds. In fact everything on her lower extremities appears to be completely closed which is great news. This is awesome after just 1 week. In general I think that we are making great progress. Electronic Signature(s) Signed: 09/01/2021 3:56:23 PM By: Worthy Keeler PA-C Entered By: Worthy Keeler on 09/01/2021 15:56:23 [name] ([id number]) -------------------------------------------------------------------------------- Physical Exam Details Patient Name: [name] Date of Service: 09/01/2021 11:00 AM Medical Record Number: [id number] Patient Account Number: [id number] Date of Birth/Sex: [birth year] (70 y.o. F) Treating RN: Carlene Coria Primary Care Provider: Ellison Hughs,  Quita Skye Other Clinician: Referring Provider: Thereasa Distance Treating Provider/Extender: Skipper Cliche in Treatment: 2 Constitutional Well-nourished and well-hydrated in no acute distress. Respiratory normal breathing without difficulty. Psychiatric this patient is able to make decisions and demonstrates good insight into disease process. Alert and Oriented x 3. pleasant and cooperative. Notes Upon inspection patient showed signs of complete epithelization there does not appear to be any open wounds and overall I am extremely happy with where we stand today. No fevers, chills, nausea, vomiting, or diarrhea. Electronic Signature(s) Signed: 09/01/2021 3:58:34 PM By: Worthy Keeler PA-C Entered By: Worthy Keeler on 09/01/2021 15:58:33 [name] ([id number]) -------------------------------------------------------------------------------- Physician Orders Details Patient Name: [name] Date of Service: 09/01/2021 11:00 AM Medical Record Number: [id number] Patient Account Number: [id number] Date of Birth/Sex: [birth year] (70 y.o. 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 Put garments on first thing when you wake up and remove them before bed. - patient to wear tubi grip size d garments times 1 week then patient to wear her personal compression garments o Moisturize legs daily after removing compression garments. o Elevate, Exercise Daily and Avoid Standing for Long Periods of Time. Electronic Signature(s) Signed: 09/01/2021 5:19:10 PM By: Worthy Keeler PA-C Signed: 09/05/2021 7:55:24 AM By: Carlene Coria RN Entered By: Carlene Coria on 09/01/2021 11:55:22 [name] ([id number]) -------------------------------------------------------------------------------- Problem List Details Patient Name: [name] Date of Service: 09/01/2021 11:00 AM Medical Record Number: [id number] Patient Account Number: [id number] Date of Birth/Sex: 1951/04/19 (69 y.o. F) Treating RN: Carlene Coria Primary Care Provider: Thereasa Distance Other Clinician: Referring Provider: Thereasa Distance Treating Provider/Extender: Skipper Cliche in Treatment: 2 Active Problems ICD-10 Encounter Code Description Active Date MDM Diagnosis I87.2 Venous insufficiency (chronic) (peripheral) 08/18/2021 No Yes I89.0 Lymphedema, not elsewhere classified 08/18/2021 No Yes L97.822 Non-pressure chronic ulcer of other part of left lower leg with fat layer 08/18/2021 No Yes exposed L97.812 Non-pressure chronic ulcer of other part of right lower leg with fat layer 08/18/2021 No Yes exposed L97.322 Non-pressure chronic ulcer of left ankle with fat layer exposed 08/18/2021 No Yes I48.0 Paroxysmal atrial fibrillation 08/18/2021 No Yes Z79.01 Long term (current) use of anticoagulants 08/18/2021 No Yes G47.30 Sleep apnea, unspecified 08/18/2021 No Yes I10 Essential (primary) hypertension 08/18/2021 No Yes L84 Corns and callosities 08/18/2021 No Yes Inactive Problems Resolved Problems Electronic  Signature(s) Signed: 09/01/2021 11:07:45 AM By: Worthy Keeler PA-C Entered By: Worthy Keeler on 09/01/2021 11:07:45 [name] ([id number]) [name] ([id number]) -------------------------------------------------------------------------------- Progress Note Details Patient Name: [name] Date of Service: 09/01/2021 11:00 AM Medical Record Number: [id number] Patient Account Number: [id number] Date of Birth/Sex: 1951-06-26 (69 y.o. F) Treating RN: Carlene Coria Primary Care Provider: Thereasa Distance Other Clinician: Referring Provider: Thereasa Distance Treating Provider/Extender: Skipper Cliche in  Treatment: 2 Subjective Chief Complaint Information obtained from Patient Bilateral LE Ulcer History of Present Illness (HPI) 05/26/2021 upon evaluation today patient appears to be doing somewhat poorly in regard to wounds that she is having currently on her lower extremities. With that being said the main issue that I see simply is that she has a lot of edema noted currently. She has seen Dr. Delana Meyer she has an appointment with him within the month as well she tells me. Nonetheless her wounds are dramatically better compared to what they were just a week ago. I saw pictures and to be honest this is a whole lot better. With that being said she was stated to have had cellulitis as well although I do not see anything right now but seems to be an issue that was treated back in March. Currently they have been using Ace wraps on her legs which actually seem to be doing quite well along with a nonstick pad. With that being said she does have quite a few other issues including some chronic kidney disease, chronic atrial fibrillation for which she is on Eliquis, hypertension, sleep apnea and she is not currently being treated for this though she states she is supposed to be, and chronic venous insufficiency along with lymphedema. 06/02/2021 upon evaluation today patient appears to be  doing better in regard to her wounds. She has been tolerating the dressing changes without complication. Fortunately there is no evidence of active infection at this time which is great news. No fevers, chills, nausea, vomiting, or diarrhea. Readmission: 08/18/2021 upon evaluation today patient appears to be doing well with regard to her legs in general although she has a couple areas that are open she does seem to have some venous issues here. Fortunately there does not appear to be any signs of active infection locally nor systemically at this point she does have a few open wounds but they are very dry. The alginate is really sticking fairly hard to it. I think she may benefit from actually switching to Xeroform as it are almost completely healed. 09/01/2021 upon evaluation today patient appears to be doing well with regard to her wounds. In fact everything on her lower extremities appears to be completely closed which is great news. This is awesome after just 1 week. In general I think that we are making great progress. Objective Constitutional Well-nourished and well-hydrated in no acute distress. Vitals Time Taken: 11:14 AM, Height: 64 in, Weight: 183 lbs, BMI: 31.4, Temperature: 97.8 F, Pulse: 74 bpm, Respiratory Rate: 18 breaths/min, Blood Pressure: 125/73 mmHg. Respiratory normal breathing without difficulty. Psychiatric this patient is able to make decisions and demonstrates good insight into disease process. Alert and Oriented x 3. pleasant and cooperative. General Notes: Upon inspection patient showed signs of complete epithelization there does not appear to be any open wounds and overall I am extremely happy with where we stand today. No fevers, chills, nausea, vomiting, or diarrhea. Integumentary (Hair, Skin) Wound #3 status is Open. Original cause of wound was Gradually Appeared. The date acquired was: 08/04/2021. The wound has been in treatment 2 weeks. The wound is located on the  Right,Lateral Lower Leg. The wound measures 0cm length x 0cm width x 0cm depth; 0cm^2 area and 0cm^3 volume. There is no tunneling or undermining noted. There is a none present amount of drainage noted. There is no granulation within the wound bed. There is no necrotic tissue within the wound bed. [name] ([id number]) Wound #4 status is  Open. Original cause of wound was Other Lesion. The date acquired was: 07/18/2021. The wound has been in treatment 2 weeks. The wound is located on the Left,Medial Malleolus. The wound measures 0cm length x 0cm width x 0cm depth; 0cm^2 area and 0cm^3 volume. There is no tunneling or undermining noted. There is a none present amount of drainage noted. There is no granulation within the wound bed. There is no necrotic tissue within the wound bed. Wound #5 status is Open. Original cause of wound was Other Lesion. The date acquired was: 07/18/2021. The wound has been in treatment 2 weeks. The wound is located on the Left,Lateral Lower Leg. The wound measures 0cm length x 0cm width x 0cm depth; 0cm^2 area and 0cm^3 volume. There is no tunneling or undermining noted. There is a none present amount of drainage noted. There is no granulation within the wound bed. There is no necrotic tissue within the wound bed. 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 Services: Discharge from [facility] Complete Wear compression garments daily. Put garments on first thing when you wake up  and remove them before bed. - patient to wear tubi grip size d garments times 1 week then patient to wear her personal compression garments Moisturize legs daily after removing compression garments. Elevate, Exercise Daily and Avoid Standing for Long Periods of Time. 1. Would recommend currently that we have the patient initiate treatment with compression stockings. Actually everything appears to be completely healed currently which is great news I am very pleased in that regard. With that being said I do believe that the patient is going to require ongoing and continued compression therapy going forward. That is good to be the only thing that keeps her from breaking down and getting worse. 2. For now [place] have her put in Tubigrip she will do that for the next week and then subsequently transition into her compression socks. We will see the patient back for follow-up visit as needed. Electronic Signature(s) Signed: 09/01/2021 3:59:34 PM By: Worthy Keeler PA-C Entered By: Worthy Keeler on 09/01/2021 15:59:33 [name] ([id number]) -------------------------------------------------------------------------------- SuperBill Details Patient Name: [name] Date of Service: 09/01/2021 Medical Record Number: [id number] Patient Account Number: [id number] Date of Birth/Sex: 24-Nov-1951 (69 y.o. 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 I48.0 Paroxysmal  atrial fibrillation Z79.01 Long term (current) use of anticoagulants G47.30 Sleep apnea, unspecified I10 Essential (primary) hypertension L84 Corns and callosities Facility Procedures CPT4 Code: [id number] Description: 99213 - WOUND CARE VISIT-LEV 3 EST PT Modifier: Quantity: 1 Physician Procedures CPT4 Code: [id number] Description: 99213 - WC PHYS LEVEL 3 - EST PT Modifier: Quantity: 1 CPT4 Code: Description: ICD-10 Diagnosis Description  I87.2 Venous insufficiency (chronic) (peripheral) I89.0 Lymphedema, not elsewhere classified L97.822 Non-pressure chronic ulcer of other part of left lower leg with fat lay L97.812 Non-pressure chronic ulcer of  other part of right lower leg with fat la Modifier: er exposed yer exposed Quantity: Electronic Signature(s) Signed: 09/01/2021 3:59:53 PM By: Worthy Keeler PA-C Entered By: Worthy Keeler on 09/01/2021 15:59:53

## 2021-09-12 ENCOUNTER — Encounter: Payer: Self-pay | Admitting: Radiology

## 2021-09-12 ENCOUNTER — Emergency Department: Payer: Medicare Other

## 2021-09-12 ENCOUNTER — Emergency Department
Admission: EM | Admit: 2021-09-12 | Discharge: 2021-09-12 | Disposition: A | Discharge status: Home / Self Care | Payer: Medicare Other | Attending: Emergency Medicine | Admitting: Emergency Medicine

## 2021-09-12 ENCOUNTER — Other Ambulatory Visit: Payer: Self-pay

## 2021-09-12 DIAGNOSIS — M25561 Pain in right knee: Secondary | ICD-10-CM | POA: Diagnosis present

## 2021-09-12 DIAGNOSIS — I480 Paroxysmal atrial fibrillation: Secondary | ICD-10-CM | POA: Diagnosis not present

## 2021-09-12 DIAGNOSIS — Z7901 Long term (current) use of anticoagulants: Secondary | ICD-10-CM | POA: Diagnosis not present

## 2021-09-12 DIAGNOSIS — I10 Essential (primary) hypertension: Secondary | ICD-10-CM | POA: Insufficient documentation

## 2021-09-12 DIAGNOSIS — M7989 Other specified soft tissue disorders: Secondary | ICD-10-CM | POA: Insufficient documentation

## 2021-09-12 DIAGNOSIS — M79661 Pain in right lower leg: Secondary | ICD-10-CM | POA: Diagnosis not present

## 2021-09-12 DIAGNOSIS — Z79899 Other long term (current) drug therapy: Secondary | ICD-10-CM | POA: Insufficient documentation

## 2021-09-12 LAB — PROTIME-INR
INR: 1.7 — ABNORMAL HIGH (ref 0.8–1.2)
Prothrombin Time: 19.8 seconds — ABNORMAL HIGH (ref 11.4–15.2)

## 2021-09-12 LAB — BASIC METABOLIC PANEL
Anion gap: 8 (ref 5–15)
BUN: 14 mg/dL (ref 8–23)
CO2: 28 mmol/L (ref 22–32)
Calcium: 9 mg/dL (ref 8.9–10.3)
Chloride: 102 mmol/L (ref 98–111)
Creatinine, Ser: 0.88 mg/dL (ref 0.44–1.00)
GFR, Estimated: >60 mL/min (ref >60)
Glucose, Bld: 104 mg/dL — ABNORMAL HIGH (ref 70–99)
Potassium: 3.3 mmol/L — ABNORMAL LOW (ref 3.5–5.1)
Sodium: 138 mmol/L (ref 135–145)

## 2021-09-12 LAB — CBC WITH DIFFERENTIAL/PLATELET
Abs Immature Granulocytes: 0.01 10*3/uL (ref 0.00–0.07)
Basophils Absolute: 0 10*3/uL (ref 0.0–0.1)
Basophils Relative: 1 %
Eosinophils Absolute: 0.2 10*3/uL (ref 0.0–0.5)
Eosinophils Relative: 3 %
HCT: 38.4 % (ref 36.0–46.0)
Hemoglobin: 11.8 g/dL — ABNORMAL LOW (ref 12.0–15.0)
Immature Granulocytes: 0 %
Lymphocytes Relative: 13 %
Lymphs Abs: 0.8 10*3/uL (ref 0.7–4.0)
MCH: 24.4 pg — ABNORMAL LOW (ref 26.0–34.0)
MCHC: 30.7 g/dL (ref 30.0–36.0)
MCV: 79.3 fL — ABNORMAL LOW (ref 80.0–100.0)
Monocytes Absolute: 0.7 10*3/uL (ref 0.1–1.0)
Monocytes Relative: 11 %
Neutro Abs: 4.7 10*3/uL (ref 1.7–7.7)
Neutrophils Relative %: 72 %
Platelets: 202 10*3/uL (ref 150–400)
RBC: 4.84 MIL/uL (ref 3.87–5.11)
RDW: 18.8 % — ABNORMAL HIGH (ref 11.5–15.5)
WBC: 6.4 10*3/uL (ref 4.0–10.5)
nRBC: 0 % (ref 0.0–0.2)

## 2021-09-12 LAB — URIC ACID: Uric Acid, Serum: 6 mg/dL (ref 2.5–7.1)

## 2021-09-12 LAB — MAGNESIUM: Magnesium: 1.9 mg/dL (ref 1.7–2.4)

## 2021-09-12 LAB — APTT: aPTT: 42 seconds — ABNORMAL HIGH (ref 24–36)

## 2021-09-12 MED ORDER — OXYCODONE-ACETAMINOPHEN 5-325 MG PO TABS
1.0000 | ORAL_TABLET | Freq: Once | ORAL | Status: AC
  Administered 2021-09-12: 1 via ORAL
  Filled 2021-09-12: qty 1

## 2021-09-12 MED ORDER — POTASSIUM CHLORIDE CRYS ER 20 MEQ PO TBCR
40.0000 meq | EXTENDED_RELEASE_TABLET | Freq: Once | ORAL | Status: AC
  Administered 2021-09-12: 40 meq via ORAL
  Filled 2021-09-12: qty 2

## 2021-09-12 MED ORDER — ACETAMINOPHEN 325 MG PO TABS
650.0000 mg | ORAL_TABLET | Freq: Once | ORAL | Status: AC
  Administered 2021-09-12: 650 mg via ORAL
  Filled 2021-09-12: qty 2

## 2021-09-12 MED ORDER — OXYCODONE-ACETAMINOPHEN 5-325 MG PO TABS: 2.0000 | ORAL_TABLET | Freq: Four times a day (QID) | ORAL | 0 refills | Status: DC | PRN

## 2021-09-12 NOTE — ED Triage Notes (Signed)
Arrives from home complaining of r knee pain. No known injury. Started 200 last night.  Started as a cramp, but has gotten worse.

## 2021-09-12 NOTE — Discharge Instructions (Signed)
Your workup in the Emergency Department today was reassuring.  We did not find any specific abnormalities.  Your x-rays were normal (other than the chronic arthritis ) and you have no sign of a blood clot in your legs.  Your lab work was all within normal limits as well.  Try taking your regular medications as well as the new prescription prescribed tonight.  We recommend that you follow-up with Dr. Roland Rack either at your scheduled appointment or at the next available opportunity to see if he has any additional recommendations for evaluation and management.  When possible, try to keep your leg elevated and consider using cold therapy on your knee.   Return to the Emergency Department if you develop new or worsening symptoms that concern you.

## 2021-09-12 NOTE — ED Notes (Signed)
Pt states that her ride is on the way and will be here shortly.

## 2021-09-12 NOTE — ED Provider Notes (Signed)
[facility] Emergency Department Provider Note  ____________________________________________   Event Date/Time   First MD Initiated Contact with Patient 09/12/21 [phone]     (approximate)  I have reviewed the triage vital signs and the nursing notes.   HISTORY  Chief Complaint Knee Pain (Arrive via ems for nontraumatic knee pain)    HPI [name] is a 70 y.o. female who presents by EMS for evaluation of right-sided knee pain.  This was acute in onset and is severe although it seems to wax and wane a little bit in severity.  It started a few hours ago and has been constant.  She has not had a fall or any other sort of trauma.  She said that she is supposed to use a wheelchair at all times because of severe arthritis particularly in her left knee.  She is minimally mobile, and she is also on Eliquis for atrial fibrillation.  She feels like her right knee might be a little bit swollen but is not hot to the touch.  She has no history of gout.  She has had no penetrating injury to her knee.  The pain is located just above the knee and the back of the leg.  It is worse when she moves around and better at rest.  She denies fever, sore throat, chest pain, shortness of breath, nausea, vomiting, and abdominal pain.  She reports that in addition to her other chronic medical issues, she has had problems with her calcium, magnesium, and potassium in the past.  She said that this has led to severe muscle cramps that felt similar to what she is currently feeling although they were more widespread than just being in one leg previously.  She said the last time she had pain like this, once the electrolyte problems were addressed her pain went away.  She has never had blood clots in the legs or the lungs.     Past Medical History:  Diagnosis Date   Anemia    Arthritis    Cellulitis    Hypertension     Patient Active Problem List   Diagnosis Date Noted   Anemia  07/07/2021   Mild protein-calorie malnutrition ([place]) 07/07/2021   Ulcer of great toe ([place]) 03/17/2021   Lymphedema 03/17/2021   Hypokalemia 11/02/2020   Hypomagnesemia 11/02/2020   Paroxysmal A-fib ([place]) 10/21/2020   Hyperlipidemia, mixed 10/21/2020   Chronic right shoulder pain 09/14/2020   Dysuria 09/14/2020   Fall 09/14/2020   Leukocytosis 09/14/2020   Dizziness 09/13/2020   Weakness 09/13/2020   Primary osteoarthritis of both knees 03/19/2017   Degenerative arthritis of right knee 07/23/2014   Allergic rhinitis 07/22/2014   Anxiety 07/22/2014   Benign essential hypertension 07/22/2014   GERD (gastroesophageal reflux disease) 07/22/2014    Past Surgical History:  Procedure Laterality Date   CESAREAN SECTION     FOOT SURGERY Left    GALLBLADDER SURGERY     MOUTH SURGERY     WISDOM TOOTH EXTRACTION      Prior to Admission medications   Medication Sig Start Date End Date Taking? Authorizing Provider  oxyCODONE-acetaminophen (PERCOCET) 5-325 MG tablet Take 2 tablets by mouth every 6 (six) hours as needed for severe pain. 09/12/21  Yes Hinda Kehr, MD  acetaminophen (TYLENOL) 650 MG CR tablet Take by mouth.    [provider]  albuterol (VENTOLIN HFA) 108 (90 Base) MCG/ACT inhaler Inhale into the lungs. 12/31/18   [provider]  amLODipine (  NORVASC) 10 MG tablet Take 1 tablet by mouth daily. 03/15/20   [provider]  apixaban (ELIQUIS) 5 MG TABS tablet Take 1 tablet by mouth 2 (two) times daily. 09/17/20   [provider]  ascorbic acid (VITAMIN C) 500 MG tablet Take by mouth. 07/07/21 07/07/22  [provider]  atorvastatin (LIPITOR) 10 MG tablet Take by mouth. 09/18/20   [provider]  budesonide-formoterol (SYMBICORT) 80-4.5 MCG/ACT inhaler Inhale into the lungs. 05/18/20   [provider]  diclofenac Sodium (VOLTAREN) 1 % GEL Apply 2 g topically 4 (four) times daily. Patient not taking: Reported on 08/10/2021     [provider]  ferrous sulfate 325 (65 FE) MG tablet Take by mouth. 07/07/21   [provider]  gabapentin (NEURONTIN) 250 MG/5ML solution Take by mouth. 07/05/21 07/05/22  [provider]  HYDROcodone-acetaminophen (NORCO/VICODIN) 5-325 MG tablet Take by mouth. 03/07/21   [provider]  hydroxychloroquine (PLAQUENIL) 200 MG tablet Take 200 mg by mouth 2 (two) times daily. 08/09/21   [provider]  loratadine (CLARITIN) 10 MG tablet Take by mouth.    [provider]  magnesium oxide (MAG-OX) 400 MG tablet Take 1 tablet by mouth daily. Patient not taking: Reported on 08/10/2021 11/03/20   [provider]  mometasone (ELOCON) 0.1 % ointment Apply topically 2 (two) times daily. 04/18/21   [provider]  mupirocin ointment (BACTROBAN) 2 % Apply topically. 04/05/21   [provider]  PARoxetine (PAXIL) 30 MG tablet Take by mouth. 07/29/20   [provider]  potassium chloride (KLOR-CON) 10 MEQ tablet Take by mouth. Patient not taking: Reported on 08/10/2021 07/30/20   [provider]  triamterene-hydrochlorothiazide (MAXZIDE-25) 37.5-25 MG tablet Take 1 tablet by mouth daily. Patient not taking: Reported on 08/10/2021 06/16/20   [provider]    Allergies Sulfa antibiotics, Amoxicillin-pot clavulanate, and Valdecoxib  Family History  Problem Relation Age of Onset   Diabetes Mother    Hypertension Mother    Hypertension Father     Social History Social History   Tobacco Use   Smoking status: Never   Smokeless tobacco: Never  Substance Use Topics   Alcohol use: Never   Drug use: Never    Review of Systems Constitutional: No fever/chills Eyes: No visual changes. ENT: No sore throat. Cardiovascular: Denies chest pain. Respiratory: Denies shortness of breath. Gastrointestinal: No abdominal pain.  No nausea, no vomiting.  No diarrhea.  No constipation. Genitourinary: Negative for  dysuria. Musculoskeletal: Pain behind and just above right knee. Integumentary: Negative for rash. Neurological: Negative for headaches, focal weakness or numbness.   ____________________________________________   PHYSICAL EXAM:  VITAL SIGNS: ED Triage Vitals  Enc Vitals Group     BP 09/12/21 0332 (!) 145/84     Pulse Rate 09/12/21 0332 75     Resp 09/12/21 0332 18     Temp 09/12/21 0332 98.2 F (36.8 C)     Temp Source 09/12/21 0332 Oral     SpO2 09/12/21 0332 99 %     Weight 09/12/21 0325 77.1 kg (170 lb)     Height 09/12/21 0325 1.626 m ('5\' 4"'$ )     Head Circumference --      Peak Flow --      Pain Score 09/12/21 0324 7     Pain Loc --      Pain Edu? --      Excl. in [place]? --     Constitutional: Alert and  oriented.  Eyes: Conjunctivae are normal.  Head: Atraumatic. Nose: No congestion/rhinnorhea. Mouth/Throat: Patient is wearing a mask. Neck: No stridor.  No meningeal signs.   Cardiovascular: Normal rate, regular rhythm. Good peripheral circulation. Respiratory: Normal respiratory effort.  No retractions. Gastrointestinal: Soft and nontender. No distention.  Musculoskeletal: Both knees are larger than anticipated, possibly due to chronic arthritis.  The right knee seems to be a little bit bigger than the left.  However there is no erythema, no excessive warmth, and no obvious effusion.  There is a small amount of tenderness to palpation just lateral to the patella, but no obvious edema or effusion.  She has tenderness to palpation just above the right popliteal fossa and the posterior leg.  There is a very small abrasion (several centimeters in diameter) that is just below the popliteal fossa, but there is no surrounding cellulitis nor induration.  There is no other visible abnormality in the area that is tender (posterior distal right upper leg). Neurologic:  Normal speech and language. No gross focal neurologic deficits are appreciated.  Skin:  Skin is warm, dry and  intact. Psychiatric: Mood and affect are normal. Speech and behavior are normal.  ____________________________________________   LABS (all labs ordered are listed, but only abnormal results are displayed)  Labs Reviewed  PROTIME-INR - Abnormal; Notable for the following components:      Result Value   Prothrombin Time 19.8 (*)    INR 1.7 (*)    All other components within normal limits  APTT - Abnormal; Notable for the following components:   aPTT 42 (*)    All other components within normal limits  CBC WITH DIFFERENTIAL/PLATELET - Abnormal; Notable for the following components:   Hemoglobin 11.8 (*)    MCV 79.3 (*)    MCH 24.4 (*)    RDW 18.8 (*)    All other components within normal limits  BASIC METABOLIC PANEL - Abnormal; Notable for the following components:   Potassium 3.3 (*)    Glucose, Bld 104 (*)    All other components within normal limits  MAGNESIUM  URIC ACID   ____________________________________________   RADIOLOGY I, Hinda Kehr, personally viewed and evaluated these images (plain radiographs) as part of my medical decision making, as well as reviewing the written report by the radiologist.  ED MD interpretation: No evidence of DVT on ultrasound.  X-rays show severe arthritis but no acute abnormality.  Official radiology report(s): US Venous Img Lower Unilateral Right  Result Date: 09/12/2021 CLINICAL DATA:  70 year old female with posterior right leg pain from the distal thigh x1 day. EXAM: RIGHT LOWER EXTREMITY VENOUS DOPPLER ULTRASOUND TECHNIQUE: Gray-scale sonography with compression, as well as color and duplex ultrasound, were performed to evaluate the deep venous system(s) from the level of the common femoral vein through the popliteal and proximal calf veins. COMPARISON:  Contralateral left lower extremity Doppler ultrasound 02/28/2021 FINDINGS: VENOUS Normal compressibility of the common femoral, superficial femoral, and popliteal veins, as well as  the visualized calf veins. Visualized portions of profunda femoral vein and great saphenous vein unremarkable. No filling defects to suggest DVT on grayscale or color Doppler imaging. Doppler waveforms show normal direction of venous flow, normal respiratory plasticity and response to augmentation. Limited views of the contralateral common femoral vein are unremarkable. OTHER There is some subcutaneous edema at the proximal calf on image 39. Limitations: none IMPRESSION: No evidence of right lower extremity deep venous thrombosis. Electronically Signed   By: Herminio Heads.D.  On: 09/12/2021 04:46   DG Knee Complete 4 Views Right  Result Date: 09/12/2021 CLINICAL DATA:  Atraumatic right knee pain. EXAM: RIGHT KNEE - COMPLETE 4+ VIEW COMPARISON:  None. FINDINGS: No acute fracture or dislocation is identified. There is a small knee joint effusion. There is severe tricompartmental joint space narrowing and bulky marginal osteophytosis. IMPRESSION: Severe tricompartmental osteoarthrosis and small knee joint effusion. Electronically Signed   By: Logan Bores M.D.   On: 09/12/2021 05:29    ____________________________________________   INITIAL IMPRESSION / MDM / ASSESSMENT AND PLAN / ED COURSE  As part of my medical decision making, I reviewed the following data within the [place] notes reviewed and incorporated, Labs reviewed , Old chart reviewed, Radiograph reviewed , and Notes from prior ED visits   Differential diagnosis includes, but is not limited to, electrolyte abnormality, muscle cramp, DVT, gout, septic arthritis, a septic arthritis.  Somewhat unclear picture upon presentation but the patient reports that she had similar pain in the past when her electrolytes were substantially abnormal.  I am checking basic labs including a magnesium level and a uric acid level.  Presentation does not appear consistent with either a septic arthritis nor gout flare, and if I put some  pressure on the tender area just above the popliteal fossa I am able to flex and extend her knee to some degree without reproducing much pain.  At this point I do not think she would benefit from an arthrocentesis.  Labs are pending as is a venous ultrasound to rule out DVT in the setting of limited mobility and atrial fibrillation.     Clinical Course as of 09/12/21 [id number]  [facility] Sep 12, 2021  0405 Reassuring labs including CBC, magnesium, and appropriately elevated coagulation studies.  Basic metabolic panel is only notable for mild hypokalemia but I ordered potassium 40 mill equivalents by mouth to begin repletion. [CF]  0415 Uric Acid, Serum: 6.0 [CF]  0446 Ordering 1 Percocet and Tylenol 650 mg. [CF]  0451 US Venous Img Lower Unilateral Right The ultrasound was negative with no evidence of DVT or Baker's cyst.  I am obtaining plain films of the right knee as well and we will see if pain control with Percocet and Tylenol provide assistance.  I was going to order Toradol but she has reported allergy to NSAIDs. [CF]  0540 DG Knee Complete 4 Views Right Severe arthritis but no acute abnormality [CF]  0551 I reassessed the patient.  She says she is feeling better.  I examined once again her knee, the joint, and the area of tenderness behind the knee.  I strongly doubt that she has a septic joint for all the reasons I stated earlier.  I am able to slightly flex and extend the knee and I believe that the pain is mostly due to her severe arthritis.  However the tenderness to palpation just superior to the popliteal fossa seems to be musculoskeletal.  We discussed all of these things and she also told me that she has an appointment already scheduled with Dr. Roland Rack for next week.  I encouraged her to keep that visit and to discuss this acute issue with him, and she could also consider calling his office and scheduling an earlier appointment if one is available.  I encouraged her to take her regular  medications, try elevation and ice, and also to consider the new prescription I gave her tonight as listed below.  I gave my usual and  customary return precautions should her symptoms worsen and she understands and agrees with the plan. [CF]    Clinical Course User Index [CF] Hinda Kehr, MD     ____________________________________________  FINAL CLINICAL IMPRESSION(S) / ED DIAGNOSES  Final diagnoses:  Acute pain of right knee     MEDICATIONS GIVEN DURING THIS VISIT:  Medications  potassium chloride SA (KLOR-CON) CR tablet 40 mEq (40 mEq Oral Given 09/12/21 0425)  oxyCODONE-acetaminophen (PERCOCET/ROXICET) 5-325 MG per tablet 1 tablet (1 tablet Oral Given 09/12/21 0459)  acetaminophen (TYLENOL) tablet 650 mg (650 mg Oral Given 09/12/21 0459)     ED Discharge Orders          Ordered    oxyCODONE-acetaminophen (PERCOCET) 5-325 MG tablet  Every 6 hours PRN        09/12/21 0607             Note:  This document was prepared using Dragon voice recognition software and may include unintentional dictation errors.   Hinda Kehr, MD 09/12/21 [phone]

## 2021-09-12 NOTE — ED Notes (Signed)
RN at bedside to give discharge instructions and pt verbalized understanding.

## 2021-10-06 ENCOUNTER — Other Ambulatory Visit: Payer: Self-pay | Admitting: Surgery

## 2021-10-12 ENCOUNTER — Other Ambulatory Visit: Payer: Self-pay

## 2021-10-12 ENCOUNTER — Encounter
Admission: RE | Admit: 2021-10-12 | Discharge: 2021-10-12 | Disposition: A | Discharge status: Home / Self Care | Payer: Medicare Other | Source: Ambulatory Visit | Attending: Surgery | Admitting: Surgery

## 2021-10-12 VITALS — BP 117/63 | HR 66 | Temp 97.4°F | Resp 18 | Ht 64.0 in | Wt 175.0 lb

## 2021-10-12 DIAGNOSIS — I480 Paroxysmal atrial fibrillation: Secondary | ICD-10-CM

## 2021-10-12 DIAGNOSIS — Z01818 Encounter for other preprocedural examination: Secondary | ICD-10-CM | POA: Insufficient documentation

## 2021-10-12 DIAGNOSIS — R8290 Unspecified abnormal findings in urine: Secondary | ICD-10-CM | POA: Diagnosis not present

## 2021-10-12 DIAGNOSIS — Z881 Allergy status to other antibiotic agents status: Secondary | ICD-10-CM | POA: Insufficient documentation

## 2021-10-12 DIAGNOSIS — Z01812 Encounter for preprocedural laboratory examination: Secondary | ICD-10-CM

## 2021-10-12 DIAGNOSIS — Z01810 Encounter for preprocedural cardiovascular examination: Secondary | ICD-10-CM | POA: Diagnosis not present

## 2021-10-12 DIAGNOSIS — Z7902 Long term (current) use of antithrombotics/antiplatelets: Secondary | ICD-10-CM | POA: Insufficient documentation

## 2021-10-12 DIAGNOSIS — M170 Bilateral primary osteoarthritis of knee: Secondary | ICD-10-CM | POA: Diagnosis not present

## 2021-10-12 HISTORY — DX: Chronic obstructive pulmonary disease, unspecified: J44.9

## 2021-10-12 HISTORY — DX: Unspecified asthma, uncomplicated: J45.909

## 2021-10-12 LAB — CBC WITH DIFFERENTIAL/PLATELET
Abs Immature Granulocytes: 0.01 10*3/uL (ref 0.00–0.07)
Basophils Absolute: 0.1 10*3/uL (ref 0.0–0.1)
Basophils Relative: 1 %
Eosinophils Absolute: 0.2 10*3/uL (ref 0.0–0.5)
Eosinophils Relative: 4 %
HCT: 42 % (ref 36.0–46.0)
Hemoglobin: 12.7 g/dL (ref 12.0–15.0)
Immature Granulocytes: 0 %
Lymphocytes Relative: 17 %
Lymphs Abs: 0.8 10*3/uL (ref 0.7–4.0)
MCH: 24.1 pg — ABNORMAL LOW (ref 26.0–34.0)
MCHC: 30.2 g/dL (ref 30.0–36.0)
MCV: 79.7 fL — ABNORMAL LOW (ref 80.0–100.0)
Monocytes Absolute: 0.5 10*3/uL (ref 0.1–1.0)
Monocytes Relative: 11 %
Neutro Abs: 3.1 10*3/uL (ref 1.7–7.7)
Neutrophils Relative %: 67 %
Platelets: 203 10*3/uL (ref 150–400)
RBC: 5.27 MIL/uL — ABNORMAL HIGH (ref 3.87–5.11)
RDW: 18.1 % — ABNORMAL HIGH (ref 11.5–15.5)
WBC: 4.6 10*3/uL (ref 4.0–10.5)
nRBC: 0 % (ref 0.0–0.2)

## 2021-10-12 LAB — URINALYSIS, ROUTINE W REFLEX MICROSCOPIC
Bilirubin Urine: NEGATIVE
Glucose, UA: NEGATIVE mg/dL
Hgb urine dipstick: NEGATIVE
Ketones, ur: NEGATIVE mg/dL
Nitrite: NEGATIVE
Protein, ur: NEGATIVE mg/dL
Specific Gravity, Urine: 1.017 (ref 1.005–1.030)
pH: 6 (ref 5.0–8.0)

## 2021-10-12 LAB — SURGICAL PCR SCREEN
MRSA, PCR: NEGATIVE
Staphylococcus aureus: POSITIVE — AB

## 2021-10-12 LAB — APTT: aPTT: 37 seconds — ABNORMAL HIGH (ref 24–36)

## 2021-10-12 LAB — COMPREHENSIVE METABOLIC PANEL
ALT: 32 U/L (ref 0–44)
AST: 38 U/L (ref 15–41)
Albumin: 3.8 g/dL (ref 3.5–5.0)
Alkaline Phosphatase: 229 U/L — ABNORMAL HIGH (ref 38–126)
Anion gap: 8 (ref 5–15)
BUN: 17 mg/dL (ref 8–23)
CO2: 27 mmol/L (ref 22–32)
Calcium: 9.2 mg/dL (ref 8.9–10.3)
Chloride: 106 mmol/L (ref 98–111)
Creatinine, Ser: 0.76 mg/dL (ref 0.44–1.00)
GFR, Estimated: >60 mL/min (ref >60)
Glucose, Bld: 86 mg/dL (ref 70–99)
Potassium: 4.3 mmol/L (ref 3.5–5.1)
Sodium: 141 mmol/L (ref 135–145)
Total Bilirubin: 0.6 mg/dL (ref 0.3–1.2)
Total Protein: 8.1 g/dL (ref 6.5–8.1)

## 2021-10-12 LAB — TYPE AND SCREEN
ABO/RH(D): O NEG
Antibody Screen: NEGATIVE

## 2021-10-12 LAB — PROTIME-INR
INR: 1.5 — ABNORMAL HIGH (ref 0.8–1.2)
Prothrombin Time: 17.8 seconds — ABNORMAL HIGH (ref 11.4–15.2)

## 2021-10-12 NOTE — Patient Instructions (Signed)
Your procedure is scheduled on: Tuesday October 18, 2021. Report to Day Surgery inside [facility] 2nd floor. To find out your arrival time please call [phone] between 1PM - 3PM on Monday October 17, 2021.  Remember: Instructions that are not followed completely may result in serious medical risk,  up to and including death, or upon the discretion of your surgeon and anesthesiologist your  surgery may need to be rescheduled.     _X__ 1. Do not eat food after midnight the night before your procedure.                 No chewing gum or hard candies. You may drink clear liquids up to 2 hours                 before you are scheduled to arrive for your surgery- DO not drink clear                 liquids within 2 hours of the start of your surgery.                 Clear Liquids include:  water, apple juice without pulp, clear Gatorade, G2 or                  Gatorade Zero (avoid Red/Purple/Blue), Black Coffee or Tea (Do not add                 anything to coffee or tea).  __X__2.   Complete the "Ensure Clear Pre-surgery Clear Carbohydrate Drink" provided to you, 2 hours before arrival. **If you are diabetic you will be provided with an alternative drink, Gatorade Zero or G2.  __X__3.  On the morning of surgery brush your teeth with toothpaste and water, you                may rinse your mouth with mouthwash if you wish.  Do not swallow any toothpaste of mouthwash.     _X__ 4.  No Alcohol for 24 hours before or after surgery.   _X__ 5.  Do Not Smoke or use e-cigarettes For 24 Hours Prior to Your Surgery.                 Do not use any chewable tobacco products for at least 6 hours prior to                 Surgery.  _X__  6.  Do not use any recreational drugs (marijuana, cocaine, heroin, ecstasy, MDMA or other)                For at least one week prior to your surgery.  Combination of these drugs with anesthesia                May have life threatening  results.  __X__  7.  Notify your doctor if there is any change in your medical condition      (cold, fever, infections).     Do not wear jewelry, make-up, hairpins, clips or nail polish. Do not wear lotions, powders, or perfumes. You may wear deodorant. Do not shave 48 hours prior to surgery. Men may shave face and neck. Do not bring valuables to the hospital.    [facility] is not responsible for any belongings or valuables.  Contacts, dentures or bridgework may not be worn into surgery. Leave your suitcase in the car. After surgery it may be brought to your room. For  patients admitted to the hospital, discharge time is determined by your treatment team.   Patients discharged the day of surgery will not be allowed to drive home.   Make arrangements for someone to be with you for the first 24 hours of your Same Day Discharge.    Please read over the following fact sheets that you were given:   Total Joint Packet    __X__ Take these medicines the morning of surgery with A SIP OF WATER:    1. amLODipine (NORVASC) 10 MG  2. hydroxychloroquine (PLAQUENIL) 200 MG  3. PARoxetine (PAXIL) 30 MG   4.  5.  6.  ____ Fleet Enema (as directed)   __X__ Use CHG Soap (or wipes) as directed  ____ Use Benzoyl Peroxide Gel as instructed  __X__ Use inhalers on the day of surgery  albuterol (VENTOLIN HFA) 108 (90 Base) MCG/ACT inhaler  budesonide-formoterol (SYMBICORT) 80-4.5 MCG/ACT inhaler  ____ Stop metformin 2 days prior to surgery    ____ Take 1/2 of usual insulin dose the night before surgery. No insulin the morning          of surgery.   __X__ Stop apixaban (ELIQUIS) 5 MG 3 days before your surgery (take last dose Friday 10/14/21).   __X__ One Week prior to surgery- Stop Anti-inflammatories such as Ibuprofen, Aleve, Advil, Motrin, meloxicam (MOBIC), diclofenac, etodolac, ketorolac, Toradol, Daypro, piroxicam, Goody's or BC powders. OK TO USE TYLENOL IF NEEDED   __X__ Stop now  supplements until after surgery. ascorbic acid (VITAMIN C) 500 MG  ____ Bring C-Pap to the hospital.    If you have any questions regarding your pre-procedure instructions,  Please call Pre-admit Testing at [phone].

## 2021-10-12 NOTE — Progress Notes (Signed)
  Perioperative Services Pre-Admission/Anesthesia Testing   Date: 10/12/21 Name: [name] MRN:   [id number]  Re: Consideration of preoperative prophylactic antibiotic change   Request sent to: Poggi, Marshall Cork, MD (routed and/or faxed via [facility])  Planned Surgical Procedure(s):    Case: [id number] Date/Time: 10/18/21 0715   Procedure: TOTAL KNEE ARTHROPLASTY (Left: Knee)   Anesthesia type: Choice   Pre-op diagnosis: PRIMARY OSTEOARTHRITIS OF LEFT KNEE.   Location: [facility] OR ROOM 03 / [place] [facility] ANEST[facility]   Surgeons: Corky Mull, MD    Clinical Notes:  Patient has a documented intolerance to AUGMENTIN Advising that this medication has caused her to experience nausea in the past.   Request:  As an evidence based approach to reducing the rate of incidence for post-operative SSI and the development of MDROs, could an agent with narrower coverage for preoperative prophylaxis in this patient's upcoming surgical course be considered?   Currently ordered preoperative prophylactic ABX: clindamycin.   Specifically requesting change to cephalosporin (CEFAZOLIN).   Please communicate decision with me and I will change the orders in Epic as per your direction.    Honor Loh, MSN, APRN, FNP-C, CEN [facility]  Peri-operative Services Nurse Practitioner FAX: [phone] 10/12/21 1:31 PM

## 2021-10-13 ENCOUNTER — Encounter: Payer: Self-pay | Admitting: Surgery

## 2021-10-13 LAB — URINE CULTURE: Culture: NO GROWTH

## 2021-10-13 NOTE — Progress Notes (Signed)
Perioperative Services  Pre-Admission/Anesthesia Testing Clinical Review  Date: 10/13/21  Patient Demographics:  Name: [name] DOB:   11-24-1951 MRN:   [id number]  Planned Surgical Procedure(s):    Case: [id number] Date/Time: 10/18/21 0715   Procedure: TOTAL KNEE ARTHROPLASTY (Left: Knee)   Anesthesia type: Choice   Pre-op diagnosis: PRIMARY OSTEOARTHRITIS OF LEFT KNEE.   Location: [facility] OR ROOM 03 / [place] ORS FOR ANESTHESIA GROUP   Surgeons: Corky Mull, MD   NOTE: Available PAT nursing documentation and vital signs have been reviewed. Clinical nursing staff has updated patient's PMH/PSHx, current medication list, and drug allergies/intolerances to ensure comprehensive history available to assist in medical decision making as it pertains to the aforementioned surgical procedure and anticipated anesthetic course. Extensive review of available clinical information performed. [name] PMH and PSHx updated with any diagnoses/procedures that  may have been inadvertently omitted during her intake with the pre-admission testing department's nursing staff.  Clinical Discussion:  [name] is a 70 y.o. female who is submitted for pre-surgical anesthesia review and clearance prior to her undergoing the above procedure. Patient has never been a smoker. Pertinent PMH includes: atrial fibrillation, IRBBB, HTN, HLD, CKD-IV, asthma, COPD, GERD (no daily Tx), anemia, OA, RA, anxiety.   Patient is followed by cardiology Nehemiah Massed, MD). She was last seen in the cardiology clinic on 04/06/2021; notes reviewed. At the time of her clinic visit, the patient denied any chest pain, PND, orthopnea, significant peripheral edema, vertiginous symptoms, or presyncope/syncope.  Patient reported shortness of breath and intermittent palpitations. Patient with a PMH significant for rate controlled atrial fibrillation. Patient does not require medications for rate/rhythm controlled. CHA2DS2-VASc Score = 3 (age,  sex, HTN). She is chronically anticoagulated with daily apixaban; compliant with therapy with no evidence of GI bleeding.   TTE performed on 09/15/2020 demonstrated normal left ventricular systolic function with an estimated EF of > 55%. There is no evidence of significant valvular regurgitation or transvalvular gradient suggestive of stenosis.     Blood pressure well controlled at 128/60 on currently prescribed CCB monotherapy. She is on a statin for her HLD diagnosis. This patient is not diabetic. Functional capacity, as defined by DASI, is documented as being >/= 4 METS. No changes were made to her medication regimen. Patient to follow up with outpatient cardiology in 9 months or sooner if needed.   [name] is scheduled for an elective LEFT TOTAL KNEE ARTHROPLASTY on 10/18/2021 with Dr. Milagros Evener, MD. Given patient's past medical history significant for cardiovascular diagnoses, presurgical cardiac clearance was sought by the PAT team. Per cardiology, "this patient is optimized for surgery and may proceed with the planned procedural course with an ACCEPTABLE risk of significant perioperative cardiovascular complications". Again, this patient is on daily anticoagulation therapy. She has been instructed on recommendations for holding her apixaban for 3 days prior to her procedure with plans to restart as soon as postoperative bleeding risk felt to be minimized by her attending surgeon. The patient has been instructed that her last dose of her anticoagulant will be on 10/14/2021.  Patient reports previous perioperative complications with anesthesia in the past. She notes a history of (+) delayed emergence from general anesthesia. In review her EMR, there are no records available for review pertaining to past procedural/anesthetic courses within the [facility] system.  Vitals with BMI 10/12/2021 09/12/2021 09/12/2021  Height 5\' 4"  - -  Weight 175 lbs - -  BMI 25.85 - -  Systolic 277 824  250   Diastolic 63 74 64  Pulse 66 61 70    Providers/Specialists:   NOTE: Primary physician provider listed below. Patient may have been seen by APP or partner within same practice.   PROVIDER ROLE / SPECIALTY LAST OV  Poggi, Marshall Cork, MD ORTHOPEDICS (SURGEON) 09/21/2021  Sofie Hartigan, MD PRIMARY CARE PROVIDER 09/05/2021  Serafina Royals, MD CARDIOLOGY 04/06/2021  Ephriam Jenkins, MD RHEUMATOLOGY 09/21/2021   Allergies:  Sulfa antibiotics, Amoxicillin-pot clavulanate, and Valdecoxib  Current Home Medications:   No current facility-administered medications for this encounter.    acetaminophen (TYLENOL) 650 MG CR tablet   albuterol (VENTOLIN HFA) 108 (90 Base) MCG/ACT inhaler   amLODipine (NORVASC) 10 MG tablet   apixaban (ELIQUIS) 5 MG TABS tablet   ascorbic acid (VITAMIN C) 500 MG tablet   atorvastatin (LIPITOR) 10 MG tablet   budesonide-formoterol (SYMBICORT) 80-4.5 MCG/ACT inhaler   ferrous sulfate 325 (65 FE) MG tablet   gabapentin (NEURONTIN) 250 MG/5ML solution   HYDROcodone-acetaminophen (NORCO/VICODIN) 5-325 MG tablet   hydroxychloroquine (PLAQUENIL) 200 MG tablet   loratadine (CLARITIN) 10 MG tablet   PARoxetine (PAXIL) 30 MG tablet   potassium chloride (KLOR-CON) 10 MEQ tablet   oxyCODONE-acetaminophen (PERCOCET) 5-325 MG tablet   History:   Past Medical History:  Diagnosis Date   Anemia    Anesthesia complication    a.) (+) delayed emergence   Anxiety    Asthma    Atrial fibrillation (HCC)    a.) rate/rhythm controlled; no meds. b.) CHA2DS2-VASc = 3 (age, sex, HTN). c.) chronically anticoagulated with daily apixaban.   Cellulitis    CKD (chronic kidney disease), stage IV (HCC)    COPD (chronic obstructive pulmonary disease) (HCC)    GERD (gastroesophageal reflux disease)    History of MRSA infection    HLD (hyperlipidemia)    Hypertension    Incomplete right bundle branch block (RBBB)    Long term (current) use of anticoagulants    a.) Apixaban   Long  term current use of immunosuppressive drug    a.) hydroxychloroquine for RA   Lymphedema    Mild protein-calorie malnutrition (HCC)    Osteoarthritis    Rheumatoid arthritis ([place])    Past Surgical History:  Procedure Laterality Date   CESAREAN SECTION     CHOLECYSTECTOMY N/A 07/31/2006   FOOT SURGERY Left    MOUTH SURGERY     WISDOM TOOTH EXTRACTION     Family History  Problem Relation Age of Onset   Diabetes Mother    Hypertension Mother    Hypertension Father    Social History   Tobacco Use   Smoking status: Never   Smokeless tobacco: Never  Vaping Use   Vaping Use: Never used  Substance Use Topics   Alcohol use: Never   Drug use: Never    Pertinent Clinical Results:  LABS: Labs reviewed: Acceptable for surgery.  Hospital Outpatient Visit on 10/12/2021  Component Date Value Ref Range Status   MRSA, PCR 10/12/2021 NEGATIVE  NEGATIVE Final   Staphylococcus aureus 10/12/2021 POSITIVE (A)  NEGATIVE Final   Comment: (NOTE) The Xpert SA Assay (FDA approved for NASAL specimens in patients 73 years of age and older), is one component of a comprehensive surveillance program. It is not intended to diagnose infection nor to guide or monitor treatment. Performed at [facility], [address] [place],  [address]    Prothrombin Time 10/12/2021 17.8 (A)  11.4 - 15.2 seconds Final   INR 10/12/2021  1.5 (A)  0.8 - 1.2 Final   Comment: (NOTE) INR goal varies based on device and disease states. Performed at [facility], [address] [place], [place] [address]    aPTT 10/12/2021 37 (A)  24 - 36 seconds Final   Comment:        IF BASELINE aPTT IS ELEVATED, SUGGEST PATIENT RISK ASSESSMENT BE USED TO DETERMINE APPROPRIATE ANTICOAGULANT THERAPY. Performed at [facility], [address] [place], [place] [address]    WBC 10/12/2021 4.6  4.0 - 10.5 K/uL Final   RBC 10/12/2021 5.27 (A)  3.87 - 5.11 MIL/uL Final   Hemoglobin  10/12/2021 12.7  12.0 - 15.0 g/dL Final   HCT 10/12/2021 42.0  36.0 - 46.0 % Final   MCV 10/12/2021 79.7 (A)  80.0 - 100.0 fL Final   MCH 10/12/2021 24.1 (A)  26.0 - 34.0 pg Final   MCHC 10/12/2021 30.2  30.0 - 36.0 g/dL Final   RDW 10/12/2021 18.1 (A)  11.5 - 15.5 % Final   Platelets 10/12/2021 203  150 - 400 K/uL Final   nRBC 10/12/2021 0.0  0.0 - 0.2 % Final   Neutrophils Relative % 10/12/2021 67  % Final   Neutro Abs 10/12/2021 3.1  1.7 - 7.7 K/uL Final   Lymphocytes Relative 10/12/2021 17  % Final   Lymphs Abs 10/12/2021 0.8  0.7 - 4.0 K/uL Final   Monocytes Relative 10/12/2021 11  % Final   Monocytes Absolute 10/12/2021 0.5  0.1 - 1.0 K/uL Final   Eosinophils Relative 10/12/2021 4  % Final   Eosinophils Absolute 10/12/2021 0.2  0.0 - 0.5 K/uL Final   Basophils Relative 10/12/2021 1  % Final   Basophils Absolute 10/12/2021 0.1  0.0 - 0.1 K/uL Final   Immature Granulocytes 10/12/2021 0  % Final   Abs Immature Granulocytes 10/12/2021 0.01  0.00 - 0.07 K/uL Final   Performed at [facility], [address] [place],  [address]   Sodium 10/12/2021 141  135 - 145 mmol/L Final   Potassium 10/12/2021 4.3  3.5 - 5.1 mmol/L Final   Chloride 10/12/2021 106  98 - 111 mmol/L Final   CO2 10/12/2021 27  22 - 32 mmol/L Final   Glucose, Bld 10/12/2021 86  70 - 99 mg/dL Final   Glucose reference range applies only to samples taken after fasting for at least 8 hours.   BUN 10/12/2021 17  8 - 23 mg/dL Final   Creatinine, Ser 10/12/2021 0.76  0.44 - 1.00 mg/dL Final   Calcium 10/12/2021 9.2  8.9 - 10.3 mg/dL Final   Total Protein 10/12/2021 8.1  6.5 - 8.1 g/dL Final   Albumin 10/12/2021 3.8  3.5 - 5.0 g/dL Final   AST 10/12/2021 38  15 - 41 U/L Final   ALT 10/12/2021 32  0 - 44 U/L Final   Alkaline Phosphatase 10/12/2021 229 (A)  38 - 126 U/L Final   Total Bilirubin 10/12/2021 0.6  0.3 - 1.2 mg/dL Final   GFR, Estimated 10/12/2021 >60  >60 mL/min Final   Comment:  (NOTE) Calculated using the CKD-EPI Creatinine Equation (2021)    Anion gap 10/12/2021 8  5 - 15 Final   Performed at [facility], [place], [place] [address]   Color, Urine 10/12/2021 YELLOW (A)  YELLOW Final   APPearance 10/12/2021 HAZY (A)  CLEAR Final   Specific Gravity, Urine 10/12/2021 1.017  1.005 - 1.030 Final   pH  10/12/2021 6.0  5.0 - 8.0 Final   Glucose, UA 10/12/2021 NEGATIVE  NEGATIVE mg/dL Final   Hgb urine dipstick 10/12/2021 NEGATIVE  NEGATIVE Final   Bilirubin Urine 10/12/2021 NEGATIVE  NEGATIVE Final   Ketones, ur 10/12/2021 NEGATIVE  NEGATIVE mg/dL Final   Protein, ur 10/12/2021 NEGATIVE  NEGATIVE mg/dL Final   Nitrite 10/12/2021 NEGATIVE  NEGATIVE Final   Leukocytes,Ua 10/12/2021 MODERATE (A)  NEGATIVE Final   RBC / HPF 10/12/2021 0-5  0 - 5 RBC/hpf Final   WBC, UA 10/12/2021 21-50  0 - 5 WBC/hpf Final   Bacteria, UA 10/12/2021 RARE (A)  NONE SEEN Final   Squamous Epithelial / LPF 10/12/2021 0-5  0 - 5 Final   Mucus 10/12/2021 PRESENT   Final   Performed at [facility], [address] [place], [place] [address]   ABO/RH(D) 10/12/2021 O NEG   Final   Antibody Screen 10/12/2021 NEG   Final   Sample Expiration 10/12/2021 10/26/2021,2359   Final   Extend sample reason 10/12/2021    Final                   Value:NO TRANSFUSIONS OR PREGNANCY IN THE PAST 3 MONTHS Performed at [facility], [address] [place], [place] [address]     ECG: Date: 10/12/2021 Time ECG obtained: 1152 AM Rate: 70 bpm Rhythm: atrial fibrillation; IRBBB Axis (leads I and aVF): Normal Intervals: QRS 94 ms. QTc 462 ms. ST segment and T wave changes: No evidence of acute ST segment elevation or depression Comparison: Similar to previous tracing obtained on 06/14/2021   IMAGING / PROCEDURES: DIAGNOSTIC RADIOGRAPHS RIGHT KNEE 4 VIEWS performed on 09/12/2021 No acute fracture or dislocation is identified.  There is a small knee joint  effusion.  There is severe tricompartmental joint space narrowing and bulky marginal osteophytosis.  DIAGNOSTIC RADIOGRAPHS LEFT KNEE 4 VIEWS performed on 03/10/2021 Severe tricompartmental degenerative joint changes with severe osteophyte formation and lateral translation.   There is positive osteopenia on x-ray.   The right knee has similar findings with complete medial lateral compartment collapse with bone-on-bone pathology and severe osteophyte formation.   No fractures are seen throughout either knee  TRANSTHORACIC ECHOCARDIOGRAM performed on 09/15/2020 Technically difficult study.  The left ventricle is normal in size with normal wall thickness. The left ventricular systolic function is normal, LVEF is visually estimated at > 55%. The aortic valve is probably trileaflet with mildly thickened leaflets with normal excursion. The right ventricle is probably mildly dilated with normal systolic function.   Impression and Plan:  [name] has been referred for pre-anesthesia review and clearance prior to her undergoing the planned anesthetic and procedural courses. Available labs, pertinent testing, and imaging results were personally reviewed by me. This patient has been appropriately cleared by cardiology with an overall ACCEPTABLE risk of significant perioperative cardiovascular complications.  Based on clinical review performed today (10/13/21), barring any significant acute changes in the patient's overall condition, it is anticipated that she will be able to proceed with the planned surgical intervention. Any acute changes in clinical condition may necessitate her procedure being postponed and/or cancelled. Patient will meet with anesthesia team (MD and/or CRNA) on the day of her procedure for preoperative evaluation/assessment. Questions regarding anesthetic course will be fielded at that time.   Pre-surgical instructions were reviewed with the patient during her PAT appointment and  questions were fielded by PAT clinical staff. Patient was advised that if any questions or concerns arise  prior to her procedure then she should return a call to PAT and/or her surgeon's office to discuss.  Honor Loh, MSN, APRN, FNP-C, CEN [facility]  Peri-operative Services Nurse Practitioner Phone: [phone] Fax: [phone] 10/13/21 10:16 AM  NOTE: This note has been prepared using Dragon dictation software. Despite my best ability to proofread, there is always the potential that unintentional transcriptional errors may still occur from this process.

## 2021-10-14 ENCOUNTER — Other Ambulatory Visit: Payer: Self-pay

## 2021-10-14 ENCOUNTER — Other Ambulatory Visit
Admission: RE | Admit: 2021-10-14 | Discharge: 2021-10-14 | Disposition: A | Discharge status: Home / Self Care | Payer: Medicare Other | Source: Ambulatory Visit | Attending: Surgery | Admitting: Surgery

## 2021-10-14 DIAGNOSIS — Z01812 Encounter for preprocedural laboratory examination: Secondary | ICD-10-CM | POA: Diagnosis present

## 2021-10-14 DIAGNOSIS — Z20822 Contact with and (suspected) exposure to covid-19: Secondary | ICD-10-CM | POA: Insufficient documentation

## 2021-10-14 LAB — SARS CORONAVIRUS 2 (TAT 6-24 HRS): SARS Coronavirus 2: NEGATIVE

## 2021-10-18 ENCOUNTER — Encounter: Payer: Self-pay | Admitting: Surgery

## 2021-10-18 ENCOUNTER — Ambulatory Visit: Payer: Medicare Other | Admitting: Urgent Care

## 2021-10-18 ENCOUNTER — Observation Stay: Payer: Medicare Other

## 2021-10-18 ENCOUNTER — Encounter
Admission: RE | Disposition: A | Discharge status: Home / Self Care | Payer: Self-pay | Source: Home / Self Care | Attending: Surgery

## 2021-10-18 ENCOUNTER — Other Ambulatory Visit: Payer: Self-pay

## 2021-10-18 ENCOUNTER — Observation Stay
Admission: RE | Admit: 2021-10-18 | Discharge: 2021-10-20 | Disposition: A | Discharge status: Home / Self Care | Payer: Medicare Other | Attending: Surgery | Admitting: Surgery

## 2021-10-18 DIAGNOSIS — Z20822 Contact with and (suspected) exposure to covid-19: Secondary | ICD-10-CM | POA: Diagnosis not present

## 2021-10-18 DIAGNOSIS — N184 Chronic kidney disease, stage 4 (severe): Secondary | ICD-10-CM | POA: Diagnosis not present

## 2021-10-18 DIAGNOSIS — Z79899 Other long term (current) drug therapy: Secondary | ICD-10-CM | POA: Diagnosis not present

## 2021-10-18 DIAGNOSIS — Z7901 Long term (current) use of anticoagulants: Secondary | ICD-10-CM | POA: Insufficient documentation

## 2021-10-18 DIAGNOSIS — I129 Hypertensive chronic kidney disease with stage 1 through stage 4 chronic kidney disease, or unspecified chronic kidney disease: Secondary | ICD-10-CM | POA: Insufficient documentation

## 2021-10-18 DIAGNOSIS — J45909 Unspecified asthma, uncomplicated: Secondary | ICD-10-CM | POA: Diagnosis not present

## 2021-10-18 DIAGNOSIS — J449 Chronic obstructive pulmonary disease, unspecified: Secondary | ICD-10-CM | POA: Insufficient documentation

## 2021-10-18 DIAGNOSIS — Z96652 Presence of left artificial knee joint: Secondary | ICD-10-CM

## 2021-10-18 DIAGNOSIS — I480 Paroxysmal atrial fibrillation: Secondary | ICD-10-CM | POA: Diagnosis not present

## 2021-10-18 DIAGNOSIS — M1712 Unilateral primary osteoarthritis, left knee: Secondary | ICD-10-CM | POA: Diagnosis present

## 2021-10-18 HISTORY — DX: Unspecified osteoarthritis, unspecified site: M19.90

## 2021-10-18 HISTORY — DX: Rheumatoid arthritis, unspecified: M06.9

## 2021-10-18 HISTORY — DX: Unspecified atrial fibrillation: I48.91

## 2021-10-18 HISTORY — DX: Long term (current) use of unspecified immunomodulators and immunosuppressants: Z79.60

## 2021-10-18 HISTORY — PX: TOTAL KNEE ARTHROPLASTY: SHX125

## 2021-10-18 HISTORY — DX: Anxiety disorder, unspecified: F41.9

## 2021-10-18 HISTORY — DX: Other complications of anesthesia, initial encounter: T88.59XA

## 2021-10-18 HISTORY — DX: Hyperlipidemia, unspecified: E78.5

## 2021-10-18 HISTORY — DX: Mild protein-calorie malnutrition: E44.1

## 2021-10-18 HISTORY — DX: Chronic kidney disease, stage 4 (severe): N18.4

## 2021-10-18 HISTORY — DX: Lymphedema, not elsewhere classified: I89.0

## 2021-10-18 HISTORY — DX: Personal history of Methicillin resistant Staphylococcus aureus infection: Z86.14

## 2021-10-18 HISTORY — DX: Other long term (current) drug therapy: Z79.899

## 2021-10-18 HISTORY — DX: Unspecified right bundle-branch block: I45.10

## 2021-10-18 HISTORY — DX: Gastro-esophageal reflux disease without esophagitis: K21.9

## 2021-10-18 HISTORY — DX: Long term (current) use of anticoagulants: Z79.01

## 2021-10-18 LAB — POCT I-STAT, CHEM 8
BUN: 16 mg/dL (ref 8–23)
Calcium, Ion: 1.18 mmol/L (ref 1.15–1.40)
Chloride: 104 mmol/L (ref 98–111)
Creatinine, Ser: 0.8 mg/dL (ref 0.44–1.00)
Glucose, Bld: 86 mg/dL (ref 70–99)
HCT: 38 % (ref 36.0–46.0)
Hemoglobin: 12.9 g/dL (ref 12.0–15.0)
Potassium: 4.2 mmol/L (ref 3.5–5.1)
Sodium: 143 mmol/L (ref 135–145)
TCO2: 28 mmol/L (ref 22–32)

## 2021-10-18 LAB — ABO/RH: ABO/RH(D): O NEG

## 2021-10-18 SURGERY — ARTHROPLASTY, KNEE, TOTAL
Anesthesia: Spinal | Site: Knee | Laterality: Left

## 2021-10-18 MED ORDER — CLINDAMYCIN PHOSPHATE 900 MG/50ML IV SOLN
INTRAVENOUS | Status: AC
  Filled 2021-10-18: qty 50

## 2021-10-18 MED ORDER — LACTATED RINGERS IV SOLN: INTRAVENOUS | Status: DC

## 2021-10-18 MED ORDER — OXYCODONE HCL 5 MG/5ML PO SOLN
5.0000 mg | Freq: Once | ORAL | Status: DC | PRN
Start: 2021-10-18 — End: 2021-10-18

## 2021-10-18 MED ORDER — BISACODYL 10 MG RE SUPP
10.0000 mg | Freq: Every day | RECTAL | Status: DC | PRN
  Filled 2021-10-18: qty 1

## 2021-10-18 MED ORDER — ACETAMINOPHEN 10 MG/ML IV SOLN
INTRAVENOUS | Status: DC | PRN
  Administered 2021-10-18: 1000 mg via INTRAVENOUS

## 2021-10-18 MED ORDER — ONDANSETRON HCL 4 MG/2ML IJ SOLN
INTRAMUSCULAR | Status: DC | PRN
  Administered 2021-10-18: 4 mg via INTRAVENOUS

## 2021-10-18 MED ORDER — FAMOTIDINE 20 MG PO TABS: 20.0000 mg | ORAL_TABLET | Freq: Once | ORAL | Status: AC

## 2021-10-18 MED ORDER — PHENYLEPHRINE HCL (PRESSORS) 10 MG/ML IV SOLN
INTRAVENOUS | Status: AC
  Filled 2021-10-18: qty 1

## 2021-10-18 MED ORDER — KETOROLAC TROMETHAMINE 15 MG/ML IJ SOLN: 7.5000 mg | Freq: Four times a day (QID) | INTRAMUSCULAR | Status: DC

## 2021-10-18 MED ORDER — ACETAMINOPHEN 10 MG/ML IV SOLN: 1000.0000 mg | Freq: Once | INTRAVENOUS | Status: DC | PRN

## 2021-10-18 MED ORDER — ESMOLOL HCL 100 MG/10ML IV SOLN
INTRAVENOUS | Status: DC | PRN
Start: 2021-10-18 — End: 2021-10-18
  Administered 2021-10-18 (×3): 5 mg via INTRAVENOUS

## 2021-10-18 MED ORDER — DOCUSATE SODIUM 100 MG PO CAPS
100.0000 mg | ORAL_CAPSULE | Freq: Two times a day (BID) | ORAL | Status: DC
  Administered 2021-10-18 – 2021-10-20 (×5): 100 mg via ORAL
  Filled 2021-10-18 (×5): qty 1

## 2021-10-18 MED ORDER — ALBUTEROL SULFATE (2.5 MG/3ML) 0.083% IN NEBU: 3.0000 mL | INHALATION_SOLUTION | Freq: Four times a day (QID) | RESPIRATORY_TRACT | Status: DC | PRN

## 2021-10-18 MED ORDER — SODIUM CHLORIDE FLUSH 0.9 % IV SOLN
INTRAVENOUS | Status: AC
  Filled 2021-10-18: qty 40

## 2021-10-18 MED ORDER — FENTANYL CITRATE (PF) 100 MCG/2ML IJ SOLN
INTRAMUSCULAR | Status: DC | PRN
  Administered 2021-10-18 (×5): 25 ug via INTRAVENOUS

## 2021-10-18 MED ORDER — LIDOCAINE HCL (PF) 2 % IJ SOLN
INTRAMUSCULAR | Status: AC
  Filled 2021-10-18: qty 5

## 2021-10-18 MED ORDER — ATORVASTATIN CALCIUM 10 MG PO TABS
5.0000 mg | ORAL_TABLET | Freq: Every day | ORAL | Status: DC
  Administered 2021-10-18 – 2021-10-19 (×2): 5 mg via ORAL
  Filled 2021-10-18 (×3): qty 0.5

## 2021-10-18 MED ORDER — OXYCODONE HCL 5 MG PO TABS
5.0000 mg | ORAL_TABLET | ORAL | Status: DC | PRN
  Administered 2021-10-19: 5 mg via ORAL
  Administered 2021-10-20: 10 mg via ORAL

## 2021-10-18 MED ORDER — METOCLOPRAMIDE HCL 5 MG/ML IJ SOLN: 5.0000 mg | Freq: Three times a day (TID) | INTRAMUSCULAR | Status: DC | PRN

## 2021-10-18 MED ORDER — PROMETHAZINE HCL 25 MG/ML IJ SOLN: 6.2500 mg | INTRAMUSCULAR | Status: DC | PRN

## 2021-10-18 MED ORDER — GABAPENTIN 250 MG/5ML PO SOLN
50.0000 mg | Freq: Three times a day (TID) | ORAL | Status: DC | PRN
  Filled 2021-10-18: qty 1

## 2021-10-18 MED ORDER — MIDAZOLAM HCL 2 MG/2ML IJ SOLN
INTRAMUSCULAR | Status: DC | PRN
  Administered 2021-10-18 (×2): 1 mg via INTRAVENOUS

## 2021-10-18 MED ORDER — FAMOTIDINE 20 MG PO TABS
ORAL_TABLET | ORAL | Status: AC
  Administered 2021-10-18: 20 mg via ORAL
  Filled 2021-10-18: qty 1

## 2021-10-18 MED ORDER — HYDROMORPHONE HCL 1 MG/ML IJ SOLN
INTRAMUSCULAR | Status: DC | PRN
  Administered 2021-10-18 (×2): .5 mg via INTRAVENOUS

## 2021-10-18 MED ORDER — SUGAMMADEX SODIUM 200 MG/2ML IV SOLN
INTRAVENOUS | Status: DC | PRN
  Administered 2021-10-18: 180 mg via INTRAVENOUS

## 2021-10-18 MED ORDER — BUPIVACAINE-EPINEPHRINE (PF) 0.5% -1:200000 IJ SOLN
INTRAMUSCULAR | Status: AC
  Filled 2021-10-18: qty 30

## 2021-10-18 MED ORDER — CEFAZOLIN SODIUM-DEXTROSE 2-4 GM/100ML-% IV SOLN
INTRAVENOUS | Status: AC
  Administered 2021-10-18: 2 g via INTRAVENOUS
  Filled 2021-10-18: qty 100

## 2021-10-18 MED ORDER — DEXAMETHASONE SODIUM PHOSPHATE 10 MG/ML IJ SOLN
INTRAMUSCULAR | Status: DC | PRN
  Administered 2021-10-18: 4 mg via INTRAVENOUS

## 2021-10-18 MED ORDER — CLINDAMYCIN PHOSPHATE 900 MG/50ML IV SOLN
900.0000 mg | INTRAVENOUS | Status: AC
  Administered 2021-10-18: 900 mg via INTRAVENOUS

## 2021-10-18 MED ORDER — SODIUM CHLORIDE 0.9 % IV SOLN: INTRAVENOUS | Status: DC

## 2021-10-18 MED ORDER — DEXAMETHASONE SODIUM PHOSPHATE 10 MG/ML IJ SOLN
INTRAMUSCULAR | Status: AC
  Filled 2021-10-18: qty 1

## 2021-10-18 MED ORDER — ACETAMINOPHEN 325 MG PO TABS: 325.0000 mg | ORAL_TABLET | Freq: Four times a day (QID) | ORAL | Status: DC | PRN

## 2021-10-18 MED ORDER — FERROUS SULFATE 325 (65 FE) MG PO TABS
325.0000 mg | ORAL_TABLET | Freq: Every day | ORAL | Status: DC
  Administered 2021-10-19 – 2021-10-20 (×2): 325 mg via ORAL
  Filled 2021-10-18 (×3): qty 1

## 2021-10-18 MED ORDER — BUPIVACAINE LIPOSOME 1.3 % IJ SUSP
INTRAMUSCULAR | Status: AC
  Filled 2021-10-18: qty 20

## 2021-10-18 MED ORDER — METOCLOPRAMIDE HCL 10 MG PO TABS: 5.0000 mg | ORAL_TABLET | Freq: Three times a day (TID) | ORAL | Status: DC | PRN

## 2021-10-18 MED ORDER — HYDROMORPHONE HCL 1 MG/ML IJ SOLN: 0.2500 mg | INTRAMUSCULAR | Status: DC | PRN

## 2021-10-18 MED ORDER — PROPOFOL 10 MG/ML IV BOLUS
INTRAVENOUS | Status: DC | PRN
  Administered 2021-10-18: 120 mg via INTRAVENOUS
  Administered 2021-10-18: 20 mg via INTRAVENOUS
  Administered 2021-10-18: 50 mg via INTRAVENOUS

## 2021-10-18 MED ORDER — ASCORBIC ACID 500 MG PO TABS
500.0000 mg | ORAL_TABLET | Freq: Every day | ORAL | Status: DC
  Administered 2021-10-19 – 2021-10-20 (×2): 500 mg via ORAL
  Filled 2021-10-18 (×3): qty 1

## 2021-10-18 MED ORDER — FENTANYL CITRATE (PF) 100 MCG/2ML IJ SOLN
INTRAMUSCULAR | Status: AC
  Filled 2021-10-18: qty 2

## 2021-10-18 MED ORDER — ACETAMINOPHEN 500 MG PO TABS
ORAL_TABLET | ORAL | Status: AC
  Filled 2021-10-18: qty 2

## 2021-10-18 MED ORDER — DIPHENHYDRAMINE HCL 12.5 MG/5ML PO ELIX
12.5000 mg | ORAL_SOLUTION | ORAL | Status: DC | PRN
  Filled 2021-10-18: qty 10

## 2021-10-18 MED ORDER — CHLORHEXIDINE GLUCONATE 0.12 % MT SOLN
OROMUCOSAL | Status: AC
  Administered 2021-10-18: 15 mL via OROMUCOSAL
  Filled 2021-10-18: qty 15

## 2021-10-18 MED ORDER — APIXABAN 5 MG PO TABS
5.0000 mg | ORAL_TABLET | Freq: Two times a day (BID) | ORAL | Status: DC
  Administered 2021-10-19 – 2021-10-20 (×3): 5 mg via ORAL
  Filled 2021-10-18 (×4): qty 1

## 2021-10-18 MED ORDER — ESMOLOL HCL 100 MG/10ML IV SOLN
INTRAVENOUS | Status: AC
  Filled 2021-10-18: qty 10

## 2021-10-18 MED ORDER — ACETAMINOPHEN 10 MG/ML IV SOLN
INTRAVENOUS | Status: AC
  Filled 2021-10-18: qty 100

## 2021-10-18 MED ORDER — ROCURONIUM BROMIDE 10 MG/ML (PF) SYRINGE
PREFILLED_SYRINGE | INTRAVENOUS | Status: AC
  Filled 2021-10-18: qty 10

## 2021-10-18 MED ORDER — LIDOCAINE HCL (CARDIAC) PF 100 MG/5ML IV SOSY
PREFILLED_SYRINGE | INTRAVENOUS | Status: DC | PRN
  Administered 2021-10-18: 60 mg via INTRAVENOUS

## 2021-10-18 MED ORDER — MOMETASONE FURO-FORMOTEROL FUM 100-5 MCG/ACT IN AERO
2.0000 | INHALATION_SPRAY | Freq: Two times a day (BID) | RESPIRATORY_TRACT | Status: DC
  Administered 2021-10-18 – 2021-10-20 (×4): 2 via RESPIRATORY_TRACT
  Filled 2021-10-18: qty 8.8

## 2021-10-18 MED ORDER — AMLODIPINE BESYLATE 10 MG PO TABS
10.0000 mg | ORAL_TABLET | Freq: Every day | ORAL | Status: DC
  Administered 2021-10-19 – 2021-10-20 (×2): 10 mg via ORAL
  Filled 2021-10-18 (×2): qty 1

## 2021-10-18 MED ORDER — LORATADINE 10 MG PO TABS
10.0000 mg | ORAL_TABLET | Freq: Every day | ORAL | Status: DC
  Administered 2021-10-19 – 2021-10-20 (×2): 10 mg via ORAL
  Filled 2021-10-18 (×3): qty 1

## 2021-10-18 MED ORDER — BUPIVACAINE-EPINEPHRINE (PF) 0.5% -1:200000 IJ SOLN
INTRAMUSCULAR | Status: DC | PRN
  Administered 2021-10-18: 30 mL via PERINEURAL

## 2021-10-18 MED ORDER — ORAL CARE MOUTH RINSE: 15.0000 mL | Freq: Once | OROMUCOSAL | Status: AC

## 2021-10-18 MED ORDER — SODIUM CHLORIDE 0.9 % IR SOLN
Status: DC | PRN
  Administered 2021-10-18: 3000 mL

## 2021-10-18 MED ORDER — KETOROLAC TROMETHAMINE 15 MG/ML IJ SOLN
INTRAMUSCULAR | Status: AC
  Filled 2021-10-18: qty 1

## 2021-10-18 MED ORDER — PAROXETINE HCL 30 MG PO TABS
30.0000 mg | ORAL_TABLET | Freq: Every day | ORAL | Status: DC
  Administered 2021-10-19 – 2021-10-20 (×2): 30 mg via ORAL
  Filled 2021-10-18 (×2): qty 1

## 2021-10-18 MED ORDER — SODIUM CHLORIDE 0.9 % IV SOLN
INTRAVENOUS | Status: DC | PRN
  Administered 2021-10-18: 60 mL

## 2021-10-18 MED ORDER — OXYCODONE HCL 5 MG PO TABS
ORAL_TABLET | ORAL | Status: AC
  Administered 2021-10-18: 5 mg via ORAL
  Filled 2021-10-18: qty 1

## 2021-10-18 MED ORDER — PROPOFOL 10 MG/ML IV BOLUS
INTRAVENOUS | Status: AC
  Filled 2021-10-18: qty 20

## 2021-10-18 MED ORDER — CHLORHEXIDINE GLUCONATE 0.12 % MT SOLN: 15.0000 mL | Freq: Once | OROMUCOSAL | Status: AC

## 2021-10-18 MED ORDER — TRAMADOL HCL 50 MG PO TABS
50.0000 mg | ORAL_TABLET | Freq: Four times a day (QID) | ORAL | Status: DC | PRN
  Administered 2021-10-19: 50 mg via ORAL

## 2021-10-18 MED ORDER — HYDROMORPHONE HCL 1 MG/ML IJ SOLN
INTRAMUSCULAR | Status: AC
  Filled 2021-10-18: qty 1

## 2021-10-18 MED ORDER — MAGNESIUM HYDROXIDE 400 MG/5ML PO SUSP: 30.0000 mL | Freq: Every day | ORAL | Status: DC | PRN

## 2021-10-18 MED ORDER — MIDAZOLAM HCL 2 MG/2ML IJ SOLN
INTRAMUSCULAR | Status: AC
  Filled 2021-10-18: qty 2

## 2021-10-18 MED ORDER — ROCURONIUM BROMIDE 100 MG/10ML IV SOLN
INTRAVENOUS | Status: DC | PRN
  Administered 2021-10-18: 40 mg via INTRAVENOUS
  Administered 2021-10-18: 10 mg via INTRAVENOUS

## 2021-10-18 MED ORDER — PHENYLEPHRINE HCL (PRESSORS) 10 MG/ML IV SOLN
INTRAVENOUS | Status: DC | PRN
  Administered 2021-10-18 (×3): 50 ug via INTRAVENOUS

## 2021-10-18 MED ORDER — PROPOFOL 500 MG/50ML IV EMUL
INTRAVENOUS | Status: DC | PRN
  Administered 2021-10-18: 50 ug/kg/min via INTRAVENOUS

## 2021-10-18 MED ORDER — 0.9 % SODIUM CHLORIDE (POUR BTL) OPTIME
TOPICAL | Status: DC | PRN
  Administered 2021-10-18: 500 mL

## 2021-10-18 MED ORDER — FLEET ENEMA 7-19 GM/118ML RE ENEM: 1.0000 | ENEMA | Freq: Once | RECTAL | Status: DC | PRN

## 2021-10-18 MED ORDER — TRANEXAMIC ACID 1000 MG/10ML IV SOLN
INTRAVENOUS | Status: DC | PRN
  Administered 2021-10-18: 1000 mg via TOPICAL

## 2021-10-18 MED ORDER — ONDANSETRON HCL 4 MG/2ML IJ SOLN
INTRAMUSCULAR | Status: AC
  Filled 2021-10-18: qty 2

## 2021-10-18 MED ORDER — ONDANSETRON HCL 4 MG PO TABS: 4.0000 mg | ORAL_TABLET | Freq: Four times a day (QID) | ORAL | Status: DC | PRN

## 2021-10-18 MED ORDER — POTASSIUM CHLORIDE CRYS ER 10 MEQ PO TBCR
10.0000 meq | EXTENDED_RELEASE_TABLET | Freq: Every day | ORAL | Status: DC
  Administered 2021-10-19 – 2021-10-20 (×2): 10 meq via ORAL
  Filled 2021-10-18 (×4): qty 1

## 2021-10-18 MED ORDER — PROPOFOL 1000 MG/100ML IV EMUL
INTRAVENOUS | Status: AC
  Filled 2021-10-18: qty 100

## 2021-10-18 MED ORDER — KETOROLAC TROMETHAMINE 15 MG/ML IJ SOLN
15.0000 mg | Freq: Once | INTRAMUSCULAR | Status: DC
Start: 2021-10-18 — End: 2021-10-18

## 2021-10-18 MED ORDER — TRANEXAMIC ACID 1000 MG/10ML IV SOLN
INTRAVENOUS | Status: AC
  Filled 2021-10-18: qty 10

## 2021-10-18 MED ORDER — OXYCODONE HCL 5 MG PO TABS
5.0000 mg | ORAL_TABLET | Freq: Once | ORAL | Status: DC | PRN
Start: 2021-10-18 — End: 2021-10-18

## 2021-10-18 MED ORDER — CEFAZOLIN SODIUM-DEXTROSE 2-4 GM/100ML-% IV SOLN: 2.0000 g | Freq: Four times a day (QID) | INTRAVENOUS | Status: AC

## 2021-10-18 MED ORDER — ONDANSETRON HCL 4 MG/2ML IJ SOLN: 4.0000 mg | Freq: Four times a day (QID) | INTRAMUSCULAR | Status: DC | PRN

## 2021-10-18 MED ORDER — ACETAMINOPHEN 500 MG PO TABS
1000.0000 mg | ORAL_TABLET | Freq: Four times a day (QID) | ORAL | Status: AC
  Administered 2021-10-18 – 2021-10-19 (×2): 1000 mg via ORAL

## 2021-10-18 SURGICAL SUPPLY — 63 items
BIT DRILL QUICK REL 1/8 2PK SL (DRILL) ×1 IMPLANT
BLADE CLIPPER SURG (BLADE) ×2 IMPLANT
BLADE SAW SAG 25X90X1.19 (BLADE) ×2 IMPLANT
BLADE SURG SZ20 CARB STEEL (BLADE) ×2 IMPLANT
BNDG ELASTIC 6X5.8 VLCR NS LF (GAUZE/BANDAGES/DRESSINGS) ×2 IMPLANT
CEMENT BONE R 1X40 (Cement) ×4 IMPLANT
CEMENT VACUUM MIXING SYSTEM (MISCELLANEOUS) ×2 IMPLANT
CHLORAPREP W/TINT 26 (MISCELLANEOUS) ×2 IMPLANT
COMP FEMORAL CRUC LEFT 67.5MM (Joint) ×2 IMPLANT
COMPONENT FEMRL CRUC LT 67.5MM (Joint) ×1 IMPLANT
COMPONENT PATELLAR VGD 7.8X3 (Joint) ×2 IMPLANT
COOLER POLAR GLACIER W/PUMP (MISCELLANEOUS) ×2 IMPLANT
COVER MAYO STAND REUSABLE (DRAPES) ×2 IMPLANT
CUFF TOURN SGL QUICK 24 (TOURNIQUET CUFF)
CUFF TOURN SGL QUICK 34 (TOURNIQUET CUFF)
CUFF TRNQT CYL 24X4X16.5-23 (TOURNIQUET CUFF) IMPLANT
CUFF TRNQT CYL 34X4.125X (TOURNIQUET CUFF) IMPLANT
DRAPE 3/4 80X56 (DRAPES) ×2 IMPLANT
DRAPE IMP U-DRAPE 54X76 (DRAPES) ×2 IMPLANT
DRAPE INCISE 23X17 IOBAN STRL (DRAPES) ×1
DRAPE INCISE IOBAN 23X17 STRL (DRAPES) ×1 IMPLANT
DRAPE INCISE IOBAN 66X45 STRL (DRAPES) ×2 IMPLANT
DRILL QUICK RELEASE 1/8 INCH (DRILL) ×1
DRSG MEPILEX SACRM 8.7X9.8 (GAUZE/BANDAGES/DRESSINGS) ×2 IMPLANT
DRSG OPSITE POSTOP 4X10 (GAUZE/BANDAGES/DRESSINGS) IMPLANT
DRSG OPSITE POSTOP 4X8 (GAUZE/BANDAGES/DRESSINGS) ×2 IMPLANT
ELECT REM PT RETURN 9FT ADLT (ELECTROSURGICAL) ×2
ELECTRODE REM PT RTRN 9FT ADLT (ELECTROSURGICAL) ×1 IMPLANT
GAUZE 4X4 16PLY ~~LOC~~+RFID DBL (SPONGE) ×2 IMPLANT
GAUZE XEROFORM 1X8 LF (GAUZE/BANDAGES/DRESSINGS) ×2 IMPLANT
GLOVE SRG 8 PF TXTR STRL LF DI (GLOVE) ×1 IMPLANT
GLOVE SURG ENC MOIS LTX SZ7.5 (GLOVE) ×8 IMPLANT
GLOVE SURG ENC MOIS LTX SZ8 (GLOVE) ×8 IMPLANT
GLOVE SURG UNDER LTX SZ8 (GLOVE) ×2 IMPLANT
GLOVE SURG UNDER POLY LF SZ8 (GLOVE) ×1
GOWN STRL REUS W/ TWL LRG LVL3 (GOWN DISPOSABLE) ×1 IMPLANT
GOWN STRL REUS W/ TWL XL LVL3 (GOWN DISPOSABLE) ×1 IMPLANT
GOWN STRL REUS W/TWL LRG LVL3 (GOWN DISPOSABLE) ×1
GOWN STRL REUS W/TWL XL LVL3 (GOWN DISPOSABLE) ×1
INSERT TIB BEARING 71X12 (Insert) ×2 IMPLANT
IV NS IRRIG 3000ML ARTHROMATIC (IV SOLUTION) ×2 IMPLANT
KIT TURNOVER KIT A (KITS) ×2 IMPLANT
MANIFOLD NEPTUNE II (INSTRUMENTS) ×2 IMPLANT
NEEDLE SPNL 20GX3.5 QUINCKE YW (NEEDLE) ×2 IMPLANT
NS IRRIG 1000ML POUR BTL (IV SOLUTION) ×2 IMPLANT
PACK TOTAL KNEE (MISCELLANEOUS) ×2 IMPLANT
PAD WRAPON POLAR KNEE (MISCELLANEOUS) ×1 IMPLANT
PLATE KNEE TIBIAL 71MM FIXED (Plate) ×2 IMPLANT
PULSAVAC PLUS IRRIG FAN TIP (DISPOSABLE) ×2
SPONGE T-LAP 18X18 ~~LOC~~+RFID (SPONGE) ×6 IMPLANT
STAPLER SKIN PROX 35W (STAPLE) ×2 IMPLANT
SUCTION FRAZIER HANDLE 10FR (MISCELLANEOUS) ×1
SUCTION TUBE FRAZIER 10FR DISP (MISCELLANEOUS) ×1 IMPLANT
SUT VIC AB 0 CT1 36 (SUTURE) ×6 IMPLANT
SUT VIC AB 2-0 CT1 27 (SUTURE) ×4
SUT VIC AB 2-0 CT1 TAPERPNT 27 (SUTURE) ×4 IMPLANT
SYR 10ML LL (SYRINGE) ×2 IMPLANT
SYR 20ML LL LF (SYRINGE) ×2 IMPLANT
SYR 30ML LL (SYRINGE) IMPLANT
TIP FAN IRRIG PULSAVAC PLUS (DISPOSABLE) ×1 IMPLANT
TRAP FLUID SMOKE EVACUATOR (MISCELLANEOUS) ×2 IMPLANT
WATER STERILE IRR 500ML POUR (IV SOLUTION) ×2 IMPLANT
WRAPON POLAR PAD KNEE (MISCELLANEOUS) ×2

## 2021-10-18 NOTE — H&P (Signed)
History of Present Illness: [name] is a 71 y.o.female who is being referred by Reche Dixon, PA-C, for left knee pain. The symptoms began Many years ago and have gradually worsened over the years. She does recall injuring her knee several years ago as well. She has seen Reche Dixon, PA-C, on several occasions over the past several years and has received several steroid injections with temporary partial relief of her symptoms. She last saw Reche Dixon, [place], in March, 2022. At that time, she was advised to consider total knee replacement surgery and has been referred to me for further discussion as to this option. She reports 2/10 pain in her knee on today's visit, but notes that her pain is worse when she is standing or walking. The pain is located along the lateral and medial aspect of the knee. The pain is described as aching, stabbing and throbbing. The symptoms are aggravated with normal daily activities, using stairs, at higher levels of activity, walking and standing. She also describes episodes of giving way of her knee when ambulating, so she is uses a walker for balance and support. She has associated mild swelling and deformity. She has tried Plaquenil, acetaminophen, over-the-counter medications, steroid injections, ice and heat with temporary partial relief. She has been told many times that she needs an artificial knee replacement, but has been hesitant up until this point. She does note that she has had difficulty with lymphedema and lower leg cellulitis this past summer requiring a brief hospitalization and extensive treatment with wound care to recover from these issues. However, she feels that she is doing well at this time in regards to her lower leg symptoms.  Current Outpatient Medications:  acetaminophen (TYLENOL) 650 MG ER tablet Take 650 mg by mouth every 8 (eight) hours as needed for Pain   albuterol (PROAIR HFA) 90 mcg/actuation inhaler Inhale 2 inhalations into the lungs as needed  for Wheezing 1 Inhaler 3   amLODIPine (NORVASC) 10 MG tablet TAKE 1 TABLET BY MOUTH EVERY DAY 90 tablet 3   apixaban (ELIQUIS) 5 mg tablet Take 1 tablet (5 mg total) by mouth 2 (two) times daily 180 tablet 3   ascorbic acid, vitamin C, (VITAMIN C) 500 MG tablet Take 1 tablet (500 mg total) by mouth once daily Take at the same time as the Ferrous Sulfate to aid in absorption. 90 tablet 3   atorvastatin (LIPITOR) 10 MG tablet Take 1 tablet (10 mg total) by mouth once daily for 30 days 90 tablet 3   ferrous sulfate 325 (65 FE) MG tablet Take 1 tablet (325 mg total) by mouth 2 (two) times daily with meals Take along with Vitamin C 500 mg Twice daily. The vitamin C helps the body to absorb the iron. 60 tablet 5   gabapentin (NEURONTIN) 50 mg/mL solution Take 1 mL (50 mg total) by mouth 3 (three) times daily as needed   hydrOXYchloroQUINE (PLAQUENIL) 200 mg tablet Take 1 tablet (200 mg total) by mouth 2 (two) times daily 60 tablet 5   loratadine (CLARITIN) 10 mg tablet Take 10 mg by mouth once daily   PARoxetine (PAXIL) 30 MG tablet Take 1 tablet (30 mg total) by mouth once daily 90 tablet 1   potassium chloride (KLOR-CON) 10 MEQ ER tablet Take 10 mEq by mouth once daily   SYMBICORT 80-4.5 mcg/actuation inhaler INHALE 2 INHALATIONS INTO THE LUNGS 2 (TWO) TIMES DAILY 3 Inhaler 1   triamcinolone 0.025 % cream APPLY DAILY ON LEGS  Allergies:   Sulfa (Sulfonamide Antibiotics) Hives and Itching   Amoxicillin-Pot Clavulanate Nausea   Valdecoxib Rash   Past Medical History:   Allergic rhinitis   Anxiety   Cellulitis   Chronic obstructive asthma, unspecified (CMS-HCC)   Dermatophytosis of nail   Essential hypertension, benign   GERD (gastroesophageal reflux disease)   History of MRSA infection   Osteoarthrosis, unspecified whether generalized or localized, lower leg   Past Surgical History:   CESAREAN SECTION   CHOLECYSTECTOMY 07/31/2006 (with intraoperative cholangiogram)   FOOT ARTHRODESIS,  TRIPLE Left   Family History:   High blood pressure (Hypertension) Mother   Diabetes Mother   Gout Mother   Arthritis Mother   High blood pressure (Hypertension) Father   Arthritis Father   Seizures Sister   Social History:   Socioeconomic History:   Marital status: Divorced  Occupational History   Occupation: Retired  Tobacco Use   Smoking status: Never Smoker   Smokeless tobacco: Never Used  Scientific laboratory technician Use: Never used  Substance and Sexual Activity   Alcohol use: No  Alcohol/week: 0.0 standard drinks   Drug use: Never   Sexual activity: Not Currently   Review of Systems:  A comprehensive 14 point ROS was performed, reviewed, and the pertinent orthopaedic findings are documented in the HPI.  Physical Exam: Vitals:  09/21/21 1036  BP: 126/86  Weight: 79.8 kg (176 lb)  Height: 162.6 cm (5\' 4" )  PainSc: 2  PainLoc: Knee   General/Constitutional: The patient appears to be well-nourished, well-developed, and in no acute distress. Neuro/Psych: Normal mood and affect, oriented to person, place and time. Eyes: Non-icteric. Pupils are equal, round, and reactive to light, and exhibit synchronous movement. Lymphatic: No palpable adenopathy. Respiratory: Lungs clear to auscultation, Normal chest excursion, No wheezes and Non-labored breathing Cardiovascular: Irregularly irregular rhythm without murmurs and No edema, swelling or tenderness, except as noted in detailed exam. Vascular: No edema, swelling or tenderness, except as noted in detailed exam. Integumentary: No impressive skin lesions present, except as noted in detailed exam. Musculoskeletal: Unremarkable, except as noted in detailed exam.  Left knee exam: GAIT: The patient presents in a wheelchair, so her gait is not assessed on today's visit. ALIGNMENT: normal SKIN: unremarkable SWELLING: minimal EFFUSION: trace WARMTH: no warmth TENDERNESS: mild over the lateral joint line and medial joint line ROM:  10 to 110 degrees McMURRAY'S: equivocal PATELLOFEMORAL: normal tracking with no peri-patellar tenderness and negative apprehension sign CREPITUS: Trace patellofemoral crepitance LACHMAN'S: negative PIVOT SHIFT: Not evaluated ANTERIOR DRAWER: negative POSTERIOR DRAWER: negative VARUS/VALGUS: Mild pseudolaxity to varus and valgus stressing  She is neurovascularly intact to the left lower extremity and foot.  Knee Imaging: Recent standing AP, lateral, and flexed PA x-rays, as well as a sunrise view, are available for review and have been reviewed by myself. These films demonstrate severe degenerative changes with 100% loss of joint space both medially and laterally and significant narrowing of the patellofemoral compartment joint space. Overall alignment is neutral. No fractures, lytic lesions, or abnormal calcifications are noted.  Assessment:  Encounter Diagnoses  Name Primary?   Primary osteoarthritis of left knee   Chronic right shoulder pain   Plan: The treatment options were discussed with the patient and her daughter. In addition, patient educational materials were provided regarding the diagnosis and treatment options. The patient is quite frustrated by her symptoms and functional limitations, and is ready to consider more aggressive treatment options. Therefore, I have recommended a surgical procedure, specifically  a left total knee arthroplasty. The procedure was discussed with the patient, as were the potential risks (including bleeding, infection, nerve and/or blood vessel injury, persistent or recurrent pain, loosening and/or failure of the components, dislocation, need for further surgery, blood clots, strokes, heart attacks and/or arhythmias, pneumonia, etc.) and benefits. The patient states her understanding and wishes to proceed. All of the patient's questions and concerns were answered. She can call any time with further concerns. She will follow up post-surgery,  routine.   H&P reviewed and patient re-examined. No changes.

## 2021-10-18 NOTE — Anesthesia Preprocedure Evaluation (Addendum)
Anesthesia Evaluation  Patient identified by MRN, date of birth, ID band Patient awake    Reviewed: Allergy & Precautions, NPO status , Patient's Chart, lab work & pertinent test results  Airway Mallampati: III  TM Distance: >3 FB Neck ROM: Full    Dental  (+) Poor Dentition, Missing,    Pulmonary COPD,  COPD inhaler,    Pulmonary exam normal breath sounds clear to auscultation       Cardiovascular Exercise Tolerance: Poor hypertension, Pt. on medications + dysrhythmias (incomplete RBBB) Atrial Fibrillation  Rhythm:Irregular Rate:Normal - Systolic murmurs and - Peripheral Edema TTE performed on 09/15/2020 demonstrated normal left ventricular systolic function with an estimated EF of > 55%. There is no evidence of significant valvular regurgitation or transvalvular gradient suggestive of stenosis.    Neuro/Psych PSYCHIATRIC DISORDERS Anxiety negative neurological ROS     GI/Hepatic Neg liver ROS, GERD  Controlled,  Endo/Other  negative endocrine ROS  Renal/GU CRFRenal disease  negative genitourinary   Musculoskeletal  (+) Arthritis , Osteoarthritis and Rheumatoid disorders,    Abdominal (+) + obese,   Peds negative pediatric ROS (+)  Hematology negative hematology ROS (+)   Anesthesia Other Findings Allergic rhinitis Anxiety Paroxysmal A-fib (HCC) Benign essential hypertension Chronic right shoulder pain GERD (gastroesophageal reflux disease) Pt held Apixaban from 10/28  Hyperlipidemia, mixed Primary osteoarthritis of both knees Degenerative arthritis of right knee Weakness Lymphedema    Reproductive/Obstetrics negative OB ROS                            Anesthesia Physical Anesthesia Plan  ASA: 3  Anesthesia Plan: Spinal   Post-op Pain Management:    Induction: Intravenous  PONV Risk Score and Plan: 2 and TIVA and Treatment may vary due to age or medical condition  Airway  Management Planned: Natural Airway and Simple Face Mask  Additional Equipment:   Intra-op Plan:   Post-operative Plan:   Informed Consent: I have reviewed the patients History and Physical, chart, labs and discussed the procedure including the risks, benefits and alternatives for the proposed anesthesia with the patient or authorized representative who has indicated his/her understanding and acceptance.     Dental advisory given  Plan Discussed with: CRNA and Anesthesiologist  Anesthesia Plan Comments:        Anesthesia Quick Evaluation

## 2021-10-18 NOTE — Op Note (Signed)
10/18/2021  10:48 AM  Patient:   [name]  Pre-Op Diagnosis:   Degenerative joint disease, left knee.  Post-Op Diagnosis:   Same  Procedure:   Left TKA using all-cemented [employer] Vanguard system with a 67.5 mm mm PCR femur, a 71 mm tibial tray with a 12 mm anterior stabilized E-poly insert, and a 34 x 7.8 mm all-poly 3-pegged domed patella.  Surgeon:   Pascal Lux, MD  Assistant:   Cameron Proud, PA-C   Anesthesia:   Attempted spinal --> GET  Findings:   As above  Complications:   None  EBL:   10 cc  Fluids:   800 cc crystalloid  UOP:   None  TT:   110 minutes at 300 mmHg  Drains:   None  Closure:   Staples  Implants:   As above  Brief Clinical Note:   The patient is a 70 year old female with a long history of progressively worsening left knee pain. The patient's symptoms have progressed despite medications, activity modification, injections, etc. The patient's history and examination were consistent with advanced degenerative joint disease of the left knee confirmed by plain radiographs. The patient presents at this time for a left total knee arthroplasty.  Procedure:   The patient was brought into the operating room. After adequate spinal anesthesia was obtained, the patient was lain in the supine position before the left lower extremity was prepped with ChloraPrep solution and draped sterilely. Preoperative antibiotics were administered. After verifying the proper laterality with a surgical timeout, the limb was exsanguinated with an Esmarch and the tourniquet inflated to 300 mmHg.   A standard anterior approach to the knee was made through an approximately 7 inch incision. The incision was carried down through the subcutaneous tissues to expose superficial retinaculum. This was split the length of the incision and the medial flap elevated sufficiently to expose the medial retinaculum. The medial retinaculum was incised, leaving a 3-4 mm cuff of tissue on the  patella. This was extended distally along the medial border of the patellar tendon and proximally through the medial third of the quadriceps tendon. A subtotal fat pad excision was performed before the soft tissues were elevated off the anteromedial and anterolateral aspects of the proximal tibia to the level of the collateral ligaments. The anterior portions of the medial and lateral menisci were removed, as was the anterior cruciate ligament. With the knee flexed to 90, the external tibial guide was positioned and the appropriate proximal tibial cut made. This piece was taken to the back table where it was measured and found to be optimally replicated by a 71 mm component.  Attention was directed to the distal femur. The intramedullary canal was accessed through a 3/8" drill hole. The intramedullary guide was inserted and positioned in order to obtain a neutral flexion gap. The intercondylar block was positioned with care taken to avoid notching the anterior cortex of the femur. The appropriate cut was made. Next, the distal cutting block was placed at 6 of valgus alignment. Using the 9 mm slot, the distal cut was made. The distal femur was measured and found to be optimally replicated by the 16.1 mm component. The 67.5 mm 4-in-1 cutting block was positioned and first the posterior, then the posterior chamfer, the anterior chamfer, and finally the anterior cuts were made. At this point, the posterior portions medial and lateral menisci were removed. A trial reduction was performed using the appropriate femoral and tibial components with first the 10  mm and then the 12 mm insert. The 12 mm insert demonstrated excellent stability to varus and valgus stressing both in flexion and extension while permitting full extension. Patella tracking was assessed and found to be excellent. Therefore, the tibial guide position was marked on the proximal tibia. The patella thickness was measured and found to be 20 mm.  Therefore, the appropriate cut was made. The patellar surface was measured and found to be optimally replicated by the 34 mm component. The three peg holes were drilled in place before the trial button was inserted. Patella tracking was assessed and found to be excellent, passing the "no thumb test". The lug holes were drilled into the distal femur before the trial component was removed, leaving only the tibial tray. The keel was then created using the appropriate tower, reamer, and punch.  The bony surfaces were prepared for cementing by irrigating them thoroughly with sterile saline solution via the jet lavage system. A bone plug was fashioned from some of the bone that had been removed previously and used to plug the distal femoral canal. In addition, 20 cc of Exparel diluted out to 60 cc with normal saline and 30 cc of 0.5% Sensorcaine were injected into the postero-medial and postero-lateral aspects of the knee, the medial and lateral gutter regions, and the peri-incisional tissues to help with postoperative analgesia. Meanwhile, the cement was being mixed on the back table. When it was ready, the tibial tray was cemented in first. The excess cement was removed using [employer] fast streamer. Next, the femoral component was impacted into place. Again, the excess cement was removed using [employer] fast streamer. The 12 mm trial insert was positioned and the knee brought into extension while the cement hardened. Finally, the patella was cemented into place and secured using the patellar clamp. Again, the excess cement was removed using [employer] fast streamer. Once the cement had hardened, the knee was placed through a range of motion with the findings as described above. Therefore, the trial insert was removed and, after verifying that no cement had been retained posteriorly, the permanent 12 mm anterior stabilized E-polyethylene insert was positioned and secured using the appropriate key locking mechanism. Again the knee was placed  through a range of motion with the findings as described above.  The wound was copiously irrigated with sterile saline solution using the jet lavage system before the quadriceps tendon and retinacular layer were reapproximated using #0 Vicryl interrupted sutures. The superficial retinacular layer also was closed using a running #0 Vicryl suture. A total of 10 cc of transexemic acid (TXA) was injected intra-articularly before the subcutaneous tissues were closed in several layers using 2-0 Vicryl interrupted sutures. The skin was closed using staples. A sterile honeycomb dressing was applied to the skin before the leg was wrapped with an Ace wrap to accommodate the Polar Care device. The patient was then awakened and returned to the recovery room in satisfactory condition after tolerating the procedure well.

## 2021-10-18 NOTE — Transfer of Care (Signed)
Immediate Anesthesia Transfer of Care Note  Patient: [name]  Procedure(s) Performed: TOTAL KNEE ARTHROPLASTY (Left: Knee)  Patient Location: PACU  Anesthesia Type:General  Level of Consciousness: awake, drowsy and patient cooperative  Airway & Oxygen Therapy: Patient Spontanous Breathing and Patient connected to face mask oxygen  Post-op Assessment: Report given to RN and Post -op Vital signs reviewed and stable  Post vital signs: Reviewed and stable  Last Vitals:  Vitals Value Taken Time  BP 140/74 10/18/21 1038  Temp    Pulse 82 10/18/21 1044  Resp 12 10/18/21 1044  SpO2 99 % 10/18/21 1044  Vitals shown include unvalidated device data.  Last Pain:  Vitals:   10/18/21 0641  TempSrc: Temporal  PainSc: 5          Complications: No notable events documented.

## 2021-10-18 NOTE — Anesthesia Postprocedure Evaluation (Addendum)
Anesthesia Post Note  Patient: [name]  Procedure(s) Performed: TOTAL KNEE ARTHROPLASTY (Left: Knee)  Patient location during evaluation: PACU Anesthesia Type: Spinal Level of consciousness: oriented, awake and alert and awake Pain management: pain level controlled Vital Signs Assessment: post-procedure vital signs reviewed and stable Respiratory status: spontaneous breathing, respiratory function stable, nonlabored ventilation and patient connected to nasal cannula oxygen Cardiovascular status: blood pressure returned to baseline and stable Postop Assessment: no headache, no backache, no apparent nausea or vomiting and spinal receding Anesthetic complications: no   No notable events documented.   Last Vitals:  Vitals:   10/18/21 1221 10/18/21 1425  BP: 130/67 125/82  Pulse: 88 77  Resp: 14 14  Temp: 36.5 C 36.4 C  SpO2: 100% 99%    Last Pain:  Vitals:   10/18/21 1425  TempSrc: Temporal  PainSc:                  [place] Ouch

## 2021-10-18 NOTE — Evaluation (Signed)
Physical Therapy Evaluation Patient Details Name: [name] MRN: [id number] DOB: 09/01/1951 Today's Date: 10/18/2021  History of Present Illness  Patient is a 70 year old female with degenerative joint disease s/p  left TKA. Past medical history of hypertension, OA, GERD, COPD, anxiety, lymphedema  Clinical Impression  PT evaluation completed. Patient is groggy and required verbal and tactile cues to maintain alertness at times during session. Patient reports she was essentially wheelchair bound prior to admission, and only takes a few steps periodically while holding on to furniture or four wheeled walker for support. She was able to pivot to and from wheelchair independently at home. She lives alone, does not drive, and gets meals on wheels.   Patient required assistance for bed mobility. Patient unable to stand on first attempt, but was able to stand with maximal assistance on second attempt. Standing tolerance is limited to ~ 10 seconds, limited by dizziness and fatigue with activity. Poor standing balance with posterior lean and unable to safely attempt to take steps today. Patient participated with LLE exercises in supine and sitting position with assistance from therapist.   She is not at her baseline level of functional mobility. Discussed discharge plans with patient and daughter in the room. Family with limited availability to assist at discharge and patient will essentially be going home alone. Given level of assistance currently required just for standing, SNF needs to be considered unless family can provide physical assistance at home with mobility.  PT will continue to evaluate discharge recommendations based on progress. Recommend to continue PT to maximize independence and facilitate return to prior level of function.      Recommendations for follow up therapy are one component of a multi-disciplinary discharge planning process, led by the attending physician.  Recommendations may  be updated based on patient status, additional functional criteria and insurance authorization.  Follow Up Recommendations Skilled nursing-short term rehab (<3 hours/day)    Assistance Recommended at Discharge Frequent or constant Supervision/Assistance  Functional Status Assessment Patient has had a recent decline in their functional status and demonstrates the ability to make significant improvements in function in a reasonable and predictable amount of time.  Equipment Recommendations  Rolling walker (2 wheels)    Recommendations for Other Services       Precautions / Restrictions Precautions Precautions: Knee Precaution Booklet Issued: Yes (comment) Restrictions Weight Bearing Restrictions: Yes LLE Weight Bearing: Weight bearing as tolerated      Mobility  Bed Mobility Overal bed mobility: Needs Assistance Bed Mobility: Supine to Sit;Sit to Supine;Rolling Rolling: Min guard (for rolling to left for bed pan placement)   Supine to sit: Mod assist Sit to supine: Mod assist;+2 for physical assistance   General bed mobility comments: increased assistance required for return to bed. assistance for LLE and trunk support    Transfers Overall transfer level: Needs assistance Equipment used: Rolling walker (2 wheels) Transfers: Sit to/from Stand Sit to Stand: Max assist           General transfer comment: unable to stand on first attempt. with second attempt, patient required maximal assistance for lifting to stand. patient then performed another partial standing bout for positioning of draw sheet on the bed. activity tolerance limited by pain and feeling of dizziness with upright activity. blood  pressure 132/67 in sitting and 126/70 after standing. Sp02 100%    Ambulation/Gait             General Gait Details: unable to walk today due to  poor standing tolerance, fatigue with minimal activity, dizzines in standing  Stairs            Wheelchair Mobility     Modified Rankin (Stroke Patients Only)       Balance Overall balance assessment: Needs assistance;History of Falls Sitting-balance support: Feet supported Sitting balance-Leahy Scale: Fair Sitting balance - Comments: Min guard for safety, patient is groggy during session. no loss of balance noted in sitting   Standing balance support: Bilateral upper extremity supported;Reliant on assistive device for balance Standing balance-Leahy Scale: Zero Standing balance comment: Max A required for posterior lean in standing position and for faciliation for weight shifting foward. standing tolearnce limited to ~ 10 seconds                             Pertinent Vitals/Pain Pain Assessment: 0-10 Pain Score: 5  Pain Location: left knee Pain Descriptors / Indicators: Pressure Pain Intervention(s): Limited activity within patient's tolerance;Premedicated before session;Repositioned (polar care re-applied at end of session)    Home Living Family/patient expects to be discharged to:: Private residence Living Arrangements: Alone Available Help at Discharge: Family;Available PRN/intermittently (daughter reports someone can check on her once per week) Type of Home: Apartment Home Access: Level entry       Home Layout: One level Home Equipment: Rollator (4 wheels);BSC;Shower seat;Grab bars - toilet;Grab bars - tub/shower;Wheelchair - manual Additional Comments: patient gets meals on wheels. she does not drive. was getting HHPT but she reports she was not ambulating during PT sessions (only exercises)    Prior Function Prior Level of Function : Independent/Modified Independent;Other (comment);History of Falls (last six months) (does not drive. 3 falls in the past 6 months)             Mobility Comments: patient is mostly in a wheelchair at baseline. she can ambulate very short distances using furniture or the rollator. ADLs Comments: patient reports she is independent with ADLs at  baseline     Hand Dominance        Extremity/Trunk Assessment   Upper Extremity Assessment Upper Extremity Assessment: Generalized weakness    Lower Extremity Assessment Lower Extremity Assessment: LLE deficits/detail (RLE with generalized weakness) LLE Deficits / Details: patient able to complete SLR x 10 independently from supine position. patient able to activate hip/knee/ankle movement in gravity eliminated position LLE Sensation: WNL       Communication   Communication: No difficulties  Cognition Arousal/Alertness: Lethargic Behavior During Therapy: WFL for tasks assessed/performed Overall Cognitive Status: Within Functional Limits for tasks assessed                                          General Comments      Exercises Total Joint Exercises Ankle Circles/Pumps: AROM;Strengthening;Left;10 reps;Supine Heel Slides: AAROM;Strengthening;Left;10 reps;Supine Straight Leg Raises: AAROM;Strengthening;Left;10 reps;Supine Long Arc Quad: AAROM;Strengthening;Left;10 reps;Seated Goniometric ROM: left knee 15-55 degrees Other Exercises Other Exercises: verbal cues for exercise technique   Assessment/Plan    PT Assessment Patient needs continued PT services  PT Problem List Decreased strength;Decreased range of motion;Decreased balance;Decreased activity tolerance;Decreased mobility;Pain;Decreased safety awareness       PT Treatment Interventions DME instruction;Gait training;Functional mobility training;Stair training;Therapeutic activities;Therapeutic exercise;Balance training;Neuromuscular re-education;Patient/family education;Wheelchair mobility training    PT Goals (Current goals can be found in the Care Plan section)  Acute Rehab PT Goals  Patient Stated Goal: to go home PT Goal Formulation: With patient Time For Goal Achievement: 11/01/21 Potential to Achieve Goals: Fair    Frequency BID   Barriers to discharge Decreased caregiver  support daughter reports that patient will have very limited support at discharge (family can visit periodically but unable to provide extensive assistance)    Co-evaluation               AM-PAC PT "6 Clicks" Mobility  Outcome Measure Help needed turning from your back to your side while in a flat bed without using bedrails?: A Little Help needed moving from lying on your back to sitting on the side of a flat bed without using bedrails?: A Lot Help needed moving to and from a bed to a chair (including a wheelchair)?: A Lot Help needed standing up from a chair using your arms (e.g., wheelchair or bedside chair)?: A Lot Help needed to walk in hospital room?: A Lot Help needed climbing 3-5 steps with a railing? : Total 6 Click Score: 12    End of Session Equipment Utilized During Treatment: Gait belt Activity Tolerance: Patient limited by lethargy;Patient limited by pain Patient left: in bed;with call bell/phone within reach;with bed alarm set;with nursing/sitter in room (patient on bed pan with nurse aware) Nurse Communication: Mobility status PT Visit Diagnosis: Unsteadiness on feet (R26.81);Muscle weakness (generalized) (M62.81);Other abnormalities of gait and mobility (R26.89)    Time: 1452-1530 PT Time Calculation (min) (ACUTE ONLY): 38 min   Charges:   PT Evaluation $PT Eval Moderate Complexity: 1 Mod PT Treatments $Therapeutic Exercise: 8-22 mins $Therapeutic Activity: 8-22 mins        Minna Merritts, PT, MPT  Percell Locus 10/18/2021, 4:10 PM

## 2021-10-18 NOTE — Anesthesia Procedure Notes (Signed)
Procedure Name: Intubation Date/Time: 10/18/2021 8:02 AM Performed by: Lowry Bowl, CRNA Pre-anesthesia Checklist: Patient identified, Emergency Drugs available, Suction available and Patient being monitored Patient Re-evaluated:Patient Re-evaluated prior to induction Oxygen Delivery Method: Circle system utilized Preoxygenation: Pre-oxygenation with 100% oxygen Induction Type: IV induction Ventilation: Mask ventilation without difficulty Laryngoscope Size: 3 and McGraph Grade View: Grade I Tube type: Oral Tube size: 7.0 mm Number of attempts: 1 Airway Equipment and Method: Stylet and Video-laryngoscopy Placement Confirmation: ETT inserted through vocal cords under direct vision, positive ETCO2 and breath sounds checked- equal and bilateral Secured at: 21 cm Tube secured with: Tape Dental Injury: Teeth and Oropharynx as per pre-operative assessment

## 2021-10-19 ENCOUNTER — Encounter: Payer: Self-pay | Admitting: Surgery

## 2021-10-19 DIAGNOSIS — M1712 Unilateral primary osteoarthritis, left knee: Secondary | ICD-10-CM | POA: Diagnosis not present

## 2021-10-19 LAB — RESP PANEL BY RT-PCR (FLU A&B, COVID) ARPGX2
Influenza A by PCR: NEGATIVE
Influenza B by PCR: NEGATIVE
SARS Coronavirus 2 by RT PCR: NEGATIVE

## 2021-10-19 LAB — CBC
HCT: 34.4 % — ABNORMAL LOW (ref 36.0–46.0)
Hemoglobin: 10.5 g/dL — ABNORMAL LOW (ref 12.0–15.0)
MCH: 24.6 pg — ABNORMAL LOW (ref 26.0–34.0)
MCHC: 30.5 g/dL (ref 30.0–36.0)
MCV: 80.6 fL (ref 80.0–100.0)
Platelets: 174 10*3/uL (ref 150–400)
RBC: 4.27 MIL/uL (ref 3.87–5.11)
RDW: 17.9 % — ABNORMAL HIGH (ref 11.5–15.5)
WBC: 8.3 10*3/uL (ref 4.0–10.5)
nRBC: 0 % (ref 0.0–0.2)

## 2021-10-19 LAB — BASIC METABOLIC PANEL
Anion gap: 4 — ABNORMAL LOW (ref 5–15)
BUN: 16 mg/dL (ref 8–23)
CO2: 29 mmol/L (ref 22–32)
Calcium: 8.3 mg/dL — ABNORMAL LOW (ref 8.9–10.3)
Chloride: 106 mmol/L (ref 98–111)
Creatinine, Ser: 0.74 mg/dL (ref 0.44–1.00)
GFR, Estimated: >60 mL/min (ref >60)
Glucose, Bld: 138 mg/dL — ABNORMAL HIGH (ref 70–99)
Potassium: 4 mmol/L (ref 3.5–5.1)
Sodium: 139 mmol/L (ref 135–145)

## 2021-10-19 MED ORDER — ACETAMINOPHEN 325 MG PO TABS
ORAL_TABLET | ORAL | Status: AC
  Administered 2021-10-19: 650 mg via ORAL
  Filled 2021-10-19: qty 2

## 2021-10-19 MED ORDER — OXYCODONE HCL 5 MG PO TABS
ORAL_TABLET | ORAL | Status: AC
  Filled 2021-10-19: qty 1

## 2021-10-19 MED ORDER — TRAMADOL HCL 50 MG PO TABS
ORAL_TABLET | ORAL | Status: AC
  Filled 2021-10-19: qty 1

## 2021-10-19 MED ORDER — OXYCODONE HCL 5 MG PO TABS
ORAL_TABLET | ORAL | Status: AC
  Administered 2021-10-19: 10 mg via ORAL
  Filled 2021-10-19: qty 2

## 2021-10-19 MED ORDER — ACETAMINOPHEN 500 MG PO TABS
ORAL_TABLET | ORAL | Status: AC
  Filled 2021-10-19: qty 2

## 2021-10-19 MED ORDER — OXYCODONE HCL 5 MG PO TABS
ORAL_TABLET | ORAL | Status: AC
  Administered 2021-10-19: 5 mg via ORAL
  Filled 2021-10-19: qty 1

## 2021-10-19 MED ORDER — TRAMADOL HCL 50 MG PO TABS
ORAL_TABLET | ORAL | Status: AC
  Administered 2021-10-19: 50 mg via ORAL
  Filled 2021-10-19: qty 1

## 2021-10-19 MED ORDER — ACETAMINOPHEN 325 MG PO TABS
ORAL_TABLET | ORAL | Status: AC
  Administered 2021-10-19: 325 mg via ORAL
  Filled 2021-10-19: qty 2

## 2021-10-19 MED ORDER — CYCLOBENZAPRINE HCL 5 MG PO TABS
5.0000 mg | ORAL_TABLET | Freq: Three times a day (TID) | ORAL | Status: DC | PRN
  Filled 2021-10-19: qty 1

## 2021-10-19 MED ORDER — CEFAZOLIN SODIUM-DEXTROSE 2-4 GM/100ML-% IV SOLN
INTRAVENOUS | Status: AC
  Administered 2021-10-19: 2 g via INTRAVENOUS
  Filled 2021-10-19: qty 100

## 2021-10-19 NOTE — Progress Notes (Signed)
Physical Therapy Treatment Patient Details Name: [name] MRN: [id number] DOB: 29-Jun-1951 Today's Date: 10/19/2021   History of Present Illness Patient is a 70 year old female with degenerative joint disease s/p  left TKA. Past medical history of hypertension, OA, GERD, COPD, anxiety, lymphedema    PT Comments    Pt was long sitting in bed upon arriving. She is A and O x 4 and agreeable to session. Does endorse 4/10 pain at rest that increased to 7-8/10 in wt bearing. Author removed O2 with pt able to maintain > 97% sao2. She required min assist to exit R side of bed with HOB elevated. Increased time to perform with vcs for improved sequencing. Session progressed to standing from elevated bed height to RW with min-mod assist of one. She was able to advance to ambulating ~ 5 ft with RW to recliner. Pt has slow antalgic step to pattern however no LOB. Distance limited by breakfast tray arriving. Will return later this afternoon to advance gait distances while addressing ROM deficits. Overall pt is progressing but will need rehab at DC due to having no assistance at home. Acute PT will continue to follow and progress as able per current POC.    Recommendations for follow up therapy are one component of a multi-disciplinary discharge planning process, led by the attending physician.  Recommendations may be updated based on patient status, additional functional criteria and insurance authorization.  Follow Up Recommendations  Skilled nursing-short term rehab (<3 hours/day)     Assistance Recommended at Discharge Frequent or constant Supervision/Assistance  Equipment Recommendations  Other (comment) (defer to next level of care)       Precautions / Restrictions Precautions Precautions: Knee Precaution Booklet Issued: Yes (comment) Restrictions Weight Bearing Restrictions: Yes LLE Weight Bearing: Weight bearing as tolerated     Mobility  Bed Mobility Overal bed mobility: Needs  Assistance Bed Mobility: Supine to Sit;Sit to Supine     Supine to sit: Min assist;HOB elevated     General bed mobility comments: pt was able to exit R side of bed with increased time and min assist. Pt does endorse increased pain with movements however pain did not limit session progression    Transfers Overall transfer level: Needs assistance Equipment used: Rolling walker (2 wheels) Transfers: Sit to/from Stand Sit to Stand: Min assist;Mod assist;From elevated surface           General transfer comment: pt was able to stand from slightly elevated bed height with min-mod assist of one. Vcs for handplacement and fwd wt shift. Overall tolerated well    Ambulation/Gait Ambulation/Gait assistance: Min assist Gait Distance (Feet): 5 Feet Assistive device: Rolling walker (2 wheels) Gait Pattern/deviations: Step-to pattern;Trunk flexed;Decreased stride length;Decreased stance time - left;Decreased step length - right Gait velocity: decreased   General Gait Details: Pt was able to advance to taking a few slow antalgic step to steps from EOB to recliner. No LOB however pain limited     Balance Overall balance assessment: Needs assistance;History of Falls Sitting-balance support: Feet supported Sitting balance-Leahy Scale: Good Sitting balance - Comments: Pt was able to sit EOB without LOB I'ly   Standing balance support: Bilateral upper extremity supported;Reliant on assistive device for balance Standing balance-Leahy Scale: Fair Standing balance comment: no LOB however is reliant on RW throughout standing for maintaining standiong balance in static and dynamic activity         Cognition Arousal/Alertness: Awake/alert Behavior During Therapy: WFL for tasks assessed/performed Overall Cognitive Status:  Within Functional Limits for tasks assessed                                 General Comments: Pt is A and O x 4 and cooperative and pleasant                Pertinent Vitals/Pain Pain Assessment: 0-10 Pain Score: 4  Pain Location: left knee Pain Descriptors / Indicators: Discomfort;Grimacing;Guarding;Moaning;Sharp Pain Intervention(s): Limited activity within patient's tolerance;Monitored during session;Premedicated before session;Repositioned;Ice applied     PT Goals (current goals can now be found in the care plan section) Acute Rehab PT Goals Patient Stated Goal: rehab then home Progress towards PT goals: Progressing toward goals    Frequency    BID      PT Plan Current plan remains appropriate       AM-PAC PT "6 Clicks" Mobility   Outcome Measure  Help needed turning from your back to your side while in a flat bed without using bedrails?: A Little Help needed moving from lying on your back to sitting on the side of a flat bed without using bedrails?: A Lot Help needed moving to and from a bed to a chair (including a wheelchair)?: A Lot Help needed standing up from a chair using your arms (e.g., wheelchair or bedside chair)?: A Lot Help needed to walk in hospital room?: A Lot Help needed climbing 3-5 steps with a railing? : A Lot 6 Click Score: 13    End of Session Equipment Utilized During Treatment: Oxygen (removed O2 during session with sao2 > 97% on rm air) Activity Tolerance: Patient tolerated treatment well;Patient limited by pain Patient left: in chair;with call bell/phone within reach;with chair alarm set;with SCD's reapplied;Other (comment) (Ice re-applied and call bell in reach) Nurse Communication: Mobility status PT Visit Diagnosis: Unsteadiness on feet (R26.81);Muscle weakness (generalized) (M62.81);Other abnormalities of gait and mobility (R26.89)     Time: 7416-3845 PT Time Calculation (min) (ACUTE ONLY): 24 min  Charges:  $Gait Training: 8-22 mins $Therapeutic Activity: 8-22 mins                     Julaine Fusi PTA 10/19/21, 9:43 AM

## 2021-10-19 NOTE — TOC Progression Note (Signed)
Transition of Care [facility]) - Progression Note    Patient Details  Name: [name] MRN: [id number] Date of Birth: 02-Jul-1951  Transition of Care [facility]) CM/SW Contact  Shelbie Hutching, RN Phone Number: 10/19/2021, 1:20 PM  Clinical Narrative:     [facility] has offered a bed and patient accepts, they can accept patient today.  Insurance has been approved.  Start today good through 11/4.  Once DC is completed and COVID test resulted RNCM will arrange EMS.    Expected Discharge Plan: Skilled Nursing Facility Barriers to Discharge: No SNF bed (SNF workup started)  Expected Discharge Plan and Services Expected Discharge Plan: [place]   Discharge Planning Services: CM Consult Post Acute Care Choice: [place] Living arrangements for the past 2 months: Apartment                 DME Arranged: N/A DME Agency: NA       HH Arranged: NA HH Agency: NA         Social Determinants of Health (SDOH) Interventions    Readmission Risk Interventions No flowsheet data found.

## 2021-10-19 NOTE — Progress Notes (Signed)
Physical Therapy Treatment Patient Details Name: [name] MRN: [id number] DOB: [birth year] Today's Date: 10/19/2021   History of Present Illness Patient is a 70 year old female with degenerative joint disease s/p  left TKA. Past medical history of hypertension, OA, GERD, COPD, anxiety, lymphedema    PT Comments    Pt was sitting in recliner upon arriving. She agrees to PT session and continues to be very pleasant and motivated throughout. She states her pain is better with mobility and wt bearing. She required increased assistance to stand from lower recliner surface height. Vcs for improved technique. Pt has poor knee extension. Lacks ~ 10 degrees. Was able to flex knee to ~ 85 degrees. Overall pt is progressing well. Pt was able to ambulate 20 ft with RW without LOB but does fatigue quickly. She was repositioned in bed post session with call bell in reach and RN aware of her abilities. Pt will greatly benefit from SNF at DC to maximize independence prior to returning home.    Recommendations for follow up therapy are one component of a multi-disciplinary discharge planning process, led by the attending physician.  Recommendations may be updated based on patient status, additional functional criteria and insurance authorization.  Follow Up Recommendations  Skilled nursing-short term rehab (<3 hours/day)     Assistance Recommended at Discharge Frequent or constant Supervision/Assistance  Equipment Recommendations  Other (comment) (defer to next level of care)       Precautions / Restrictions Precautions Precautions: Knee Precaution Booklet Issued: Yes (comment) Restrictions Weight Bearing Restrictions: Yes LLE Weight Bearing: Weight bearing as tolerated     Mobility  Bed Mobility Overal bed mobility: Needs Assistance Bed Mobility: Supine to Sit;Sit to Supine       Sit to supine: Min assist;HOB elevated   General bed mobility comments: Min assist to progress LLE back into  bed after being OOB throughout the day.    Transfers Overall transfer level: Needs assistance Equipment used: Rolling walker (2 wheels) Transfers: Sit to/from Stand Sit to Stand: Mod assist           General transfer comment: mod assist to stand from lower recliner height.    Ambulation/Gait Ambulation/Gait assistance: Min guard Gait Distance (Feet): 20 Feet Assistive device: Rolling walker (2 wheels) Gait Pattern/deviations: Step-to pattern;Trunk flexed;Decreased stride length;Decreased stance time - left;Decreased step length - right Gait velocity: decreased   General Gait Details: Pt was able to progress to ambulation to 20 ft with RW with slow antalgic step to gait pattern. no LOB however poor posture noted throughout. Pt is extremely motivated throughout.     Balance Overall balance assessment: Needs assistance;History of Falls Sitting-balance support: Feet supported Sitting balance-Leahy Scale: Good Sitting balance - Comments: Pt was able to sit EOB without LOB I'ly   Standing balance support: Bilateral upper extremity supported;Reliant on assistive device for balance Standing balance-Leahy Scale: Fair Standing balance comment: pt is still high fall risk due to weakness and UE reliance during all standing activity.         Cognition Arousal/Alertness: Awake/alert Behavior During Therapy: WFL for tasks assessed/performed Overall Cognitive Status: Within Functional Limits for tasks assessed      General Comments: Pt is A and O x 4 and cooperative and pleasant        Exercises Total Joint Exercises Ankle Circles/Pumps: AROM;Strengthening;Left;10 reps;Supine Quad Sets: AROM;10 reps Heel Slides: AROM;10 reps Hip ABduction/ADduction: AROM;10 reps Straight Leg Raises: AROM;10 reps Goniometric ROM: 10-84  Pertinent Vitals/Pain Pain Assessment: 0-10 Pain Score: 6  Pain Location: left knee Pain Descriptors / Indicators:  Discomfort;Grimacing;Guarding;Moaning;Sharp Pain Intervention(s): Limited activity within patient's tolerance;Monitored during session;Premedicated before session;Repositioned     PT Goals (current goals can now be found in the care plan section) Acute Rehab PT Goals Patient Stated Goal: rehab then home Progress towards PT goals: Progressing toward goals    Frequency    BID      PT Plan Current plan remains appropriate       AM-PAC PT "6 Clicks" Mobility   Outcome Measure  Help needed turning from your back to your side while in a flat bed without using bedrails?: A Little Help needed moving from lying on your back to sitting on the side of a flat bed without using bedrails?: A Lot Help needed moving to and from a bed to a chair (including a wheelchair)?: A Lot Help needed standing up from a chair using your arms (e.g., wheelchair or bedside chair)?: A Lot Help needed to walk in hospital room?: A Lot Help needed climbing 3-5 steps with a railing? : A Lot 6 Click Score: 13    End of Session   Activity Tolerance: Patient tolerated treatment well Patient left: in bed;with call bell/phone within reach;with bed alarm set;with nursing/sitter in room;with SCD's reapplied Nurse Communication: Mobility status PT Visit Diagnosis: Unsteadiness on feet (R26.81);Muscle weakness (generalized) (M62.81);Other abnormalities of gait and mobility (R26.89)     Time: 1555-1620 PT Time Calculation (min) (ACUTE ONLY): 25 min  Charges:  $Gait Training: 8-22 mins $Therapeutic Exercise: 8-22 mins                     Julaine Fusi PTA 10/19/21, 4:36 PM

## 2021-10-19 NOTE — Progress Notes (Signed)
Subjective: 1 Day Post-Op Procedure(s) (LRB): TOTAL KNEE ARTHROPLASTY (Left) Patient reports pain as mild.  Reports more of a cramping sensation in the left. Patient is well, and has had no acute complaints or problems Plan is to go Skilled nursing facility after hospital stay. Patient lives by herself and has been using a wheelchair for several months for assistance with ambulation. Negative for chest pain and shortness of breath Fever: no Gastrointestinal:Negatve for nausea and vomiting  Objective: Vital signs in last 24 hours: Temp:  [97.3 F (36.3 C)-98.6 F (37 C)] 98.2 F (36.8 C) (11/02 0400) Pulse Rate:  [60-101] 60 (11/02 0400) Resp:  [10-16] 16 (11/02 0400) BP: (118-147)/(55-82) 130/60 (11/02 0400) SpO2:  [78 %-100 %] 100 % (11/02 0400)  Intake/Output from previous day:  Intake/Output Summary (Last 24 hours) at 10/19/2021 0802 Last data filed at 10/19/2021 0433 Gross per 24 hour  Intake 1095.37 ml  Output 1710 ml  Net -614.63 ml    Intake/Output this shift: No intake/output data recorded.  Labs: Recent Labs    10/18/21 0641 10/19/21 0415  HGB 12.9 10.5*   Recent Labs    10/18/21 0641 10/19/21 0415  WBC  --  8.3  RBC  --  4.27  HCT 38.0 34.4*  PLT  --  174   Recent Labs    10/18/21 0641 10/19/21 0415  NA 143 139  K 4.2 4.0  CL 104 106  CO2  --  29  BUN 16 16  CREATININE 0.80 0.74  GLUCOSE 86 138*  CALCIUM  --  8.3*   No results for input(s): LABPT, INR in the last 72 hours.   EXAM General - Patient is Alert, Appropriate, and Oriented Extremity - ABD soft Neurovascular intact Dorsiflexion/Plantar flexion intact Incision: scant drainage No cellulitis present Dressing/Incision - Minimal blood tinged drainage Motor Function - intact, moving foot and toes well on exam.  Negative Homan's to bilateral lower extremities. Abdomen soft with normal bowel sounds.  Past Medical History:  Diagnosis Date   Anemia    Anesthesia complication     a.) (+) delayed emergence   Anxiety    Asthma    Atrial fibrillation ([place])    a.) rate/rhythm controlled; no meds. b.) CHA2DS2-VASc = 3 (age, sex, HTN). c.) chronically anticoagulated with daily apixaban.   Cellulitis    CKD (chronic kidney disease), stage IV (HCC)    COPD (chronic obstructive pulmonary disease) (HCC)    GERD (gastroesophageal reflux disease)    History of MRSA infection    HLD (hyperlipidemia)    Hypertension    Incomplete right bundle branch block (RBBB)    Long term (current) use of anticoagulants    a.) Apixaban   Long term current use of immunosuppressive drug    a.) hydroxychloroquine for RA   Lymphedema    Mild protein-calorie malnutrition (HCC)    Osteoarthritis    Rheumatoid arthritis (HCC)     Assessment/Plan: 1 Day Post-Op Procedure(s) (LRB): TOTAL KNEE ARTHROPLASTY (Left) Active Problems:   Status post total knee replacement using cement, left  Estimated body mass index is 30.04 kg/m as calculated from the following:   Height as of this encounter: 5\' 4"  (1.626 m).   Weight as of this encounter: 79.4 kg. Advance diet Up with therapy D/C IV fluids when tolerating po intake.  Labs reviewed this AM.  Hg 10.5 this morning. Up with therapy today, will need SNF upon discharge. Patient is urinating without pain this morning.  Begin working on  BM. Discharge to SNF when insurance approves.   DVT Prophylaxis - Foot Pumps, TED hose, and Eliquis Weight-Bearing as tolerated to left leg  J. Cameron Proud, PA-C [facility] Orthopaedic Surgery 10/19/2021, 8:02 AM

## 2021-10-19 NOTE — TOC Initial Note (Signed)
Transition of Care [facility]) - Initial/Assessment Note    Patient Details  Name: [name] MRN: [id number] Date of Birth: 1951/08/09  [facility]) CM/SW Contact:    Shelbie Hutching, RN Phone Number: 10/19/2021, 10:55 AM  Clinical Narrative:                 Patient is under observation after knee replacement.  RNCM met with patient at the bedside.  Patient's daughter is also present.    Patient is from home where she lives alone, she has been using a wheelchair for the past 5 months or so, she also has a walker, rollator, 3 in 1, tub bench, and lift chair.  [facility] was coming out and just discharged patient.  Patient uses [facility] for transportation.  Patient agrees with PT recommendation of SNF for short term rehab and she prefers to go to [facility] as she has been there twice in the past.   TOC will send bed request to [facility].    Expected Discharge Plan: Skilled Nursing Facility Barriers to Discharge: No SNF bed (SNF workup started)   Patient Goals and CMS Choice Patient states their goals for this hospitalization and ongoing recovery are:: Patient wants to go for short term rehab.  Prefers [email or web address] Compare Post Acute Care list provided to:: Patient Choice offered to / list presented to : Patient  Expected Discharge Plan and Services Expected Discharge Plan: [place]   Discharge Planning Services: CM Consult Post Acute Care Choice: [place] Living arrangements for the past 2 months: Apartment                 DME Arranged: N/A DME Agency: NA       HH Arranged: NA [place] Agency: NA        Prior Living Arrangements/Services Living arrangements for the past 2 months: Apartment Lives with:: Self Patient language and need for interpreter reviewed:: Yes Do you feel safe going back to the place where you live?: Yes      Need for Family Participation in Patient Care: Yes (Comment) Care giver support  system in place?: Yes (comment) Current home services: DME (walker, rollator, 3 in 1, tub bench, lift chair, reacher, wheelchair) Criminal Activity/Legal Involvement Pertinent to Current Situation/Hospitalization: No - Comment as needed  Activities of Daily Living Home Assistive Devices/Equipment: Eyeglasses, Environmental consultant (specify type), Cane (specify quad or straight), Wheelchair ADL Screening (condition at time of admission) Patient's cognitive ability adequate to safely complete daily activities?: Yes Is the patient deaf or have difficulty hearing?: No Does the patient have difficulty seeing, even when wearing glasses/contacts?: No Does the patient have difficulty concentrating, remembering, or making decisions?: No Patient able to express need for assistance with ADLs?: Yes Does the patient have difficulty dressing or bathing?: No Independently performs ADLs?: No Communication: Independent Dressing (OT): Needs assistance Is this a change from baseline?: Pre-admission baseline Grooming: Needs assistance Is this a change from baseline?: Pre-admission baseline Feeding: Independent Bathing: Needs assistance Is this a change from baseline?: Pre-admission baseline Toileting: Needs assistance Is this a change from baseline?: Pre-admission baseline In/Out Bed: Independent with device (comment) Walks in Home: Needs assistance Is this a change from baseline?: Pre-admission baseline Does the patient have difficulty walking or climbing stairs?: Yes Weakness of Legs: Both Weakness of Arms/Hands: None  Permission Sought/Granted Permission sought to share information with : Case Manager, Customer service manager, Family Supports Permission granted to share information with : Yes,  Verbal Permission Granted  Share Information with NAME: [name]  Permission granted to share info w AGENCY: [facility] or other SNF's  Permission granted to share info w Relationship: daughter  Permission  granted to share info w Contact Information: [phone]  Emotional Assessment Appearance:: Appears stated age Attitude/Demeanor/Rapport: Engaged Affect (typically observed): Accepting Orientation: : Oriented to Self, Oriented to Place, Oriented to  Time, Oriented to Situation Alcohol / Substance Use: Not Applicable Psych Involvement: No (comment)  Admission diagnosis:  Status post total knee replacement using cement, left [Z96.652] Patient Active Problem List   Diagnosis Date Noted   Status post total knee replacement using cement, left 10/18/2021   Anemia 07/07/2021   Mild protein-calorie malnutrition ([place]) 07/07/2021   Ulcer of great toe ([place]) 03/17/2021   Lymphedema 03/17/2021   Hypokalemia 11/02/2020   Hypomagnesemia 11/02/2020   Paroxysmal A-fib ([place]) 10/21/2020   Hyperlipidemia, mixed 10/21/2020   Chronic right shoulder pain 09/14/2020   Dysuria 09/14/2020   Fall 09/14/2020   Leukocytosis 09/14/2020   Dizziness 09/13/2020   Weakness 09/13/2020   Primary osteoarthritis of both knees 03/19/2017   Degenerative arthritis of right knee 07/23/2014   Allergic rhinitis 07/22/2014   Anxiety 07/22/2014   Benign essential hypertension 07/22/2014   GERD (gastroesophageal reflux disease) 07/22/2014   PCP:  Sofie Hartigan, MD Pharmacy:   [id number] [place] [address]Phone: [phone]: [phone]    Social Determinants of Health (SDOH) Interventions    Readmission Risk Interventions No flowsheet data found.

## 2021-10-19 NOTE — TOC Progression Note (Signed)
Transition of Care [facility]) - Progression Note    Patient Details  Name: [name] MRN: [id number] Date of Birth: Sep 10, 1951  [facility]) CM/SW Contact  Shelbie Hutching, RN Phone Number: 10/19/2021, 2:47 PM  Clinical Narrative:    Plan for discharge in the morning to [facility].  Penny over at [place] says the earlier the better.  TOC will arrange transport in the am.    Expected Discharge Plan: [place] Barriers to Discharge: No SNF bed (SNF workup started)  Expected Discharge Plan and Services Expected Discharge Plan: [place]   Discharge Planning Services: CM Consult Post Acute Care Choice: [place] Living arrangements for the past 2 months: Apartment                 DME Arranged: N/A DME Agency: NA       HH Arranged: NA HH Agency: NA         Social Determinants of Health (SDOH) Interventions    Readmission Risk Interventions No flowsheet data found.

## 2021-10-19 NOTE — Care Management Obs Status (Signed)
[place] NOTIFICATION   Patient Details  Name: ALYSANDRA LOBUE MRN: [id number] Date of Birth: 13-Apr-1951   Medicare Observation Status Notification Given:  Yes    Shelbie Hutching, RN 10/19/2021, 12:43 PM

## 2021-10-19 NOTE — NC FL2 (Signed)
[place] LEVEL OF CARE SCREENING TOOL     IDENTIFICATION  Patient Name: [name] Birthdate: [birth year] Sex: female Admission Date (Current Location): 10/18/2021  [facility] and [place] Number:  Engineering geologist and Address:  [facility], [address], [place], [place] [address]      Provider Number: [id number]  Attending Physician Name and Address:  Corky Mull, MD  Relative Name and Phone Number:  Shauntell Iglesia [phone]    Current Level of Care: Hospital Recommended Level of Care: [place] Prior Approval Number:    Date Approved/Denied:   PASRR Number: [id number]  Discharge Plan: SNF    Current Diagnoses: Patient Active Problem List   Diagnosis Date Noted   Status post total knee replacement using cement, left 10/18/2021   Anemia 07/07/2021   Mild protein-calorie malnutrition ([place]) 07/07/2021   Ulcer of great toe ([place]) 03/17/2021   Lymphedema 03/17/2021   Hypokalemia 11/02/2020   Hypomagnesemia 11/02/2020   Paroxysmal A-fib ([place]) 10/21/2020   Hyperlipidemia, mixed 10/21/2020   Chronic right shoulder pain 09/14/2020   Dysuria 09/14/2020   Fall 09/14/2020   Leukocytosis 09/14/2020   Dizziness 09/13/2020   Weakness 09/13/2020   Primary osteoarthritis of both knees 03/19/2017   Degenerative arthritis of right knee 07/23/2014   Allergic rhinitis 07/22/2014   Anxiety 07/22/2014   Benign essential hypertension 07/22/2014   GERD (gastroesophageal reflux disease) 07/22/2014    Orientation RESPIRATION BLADDER Height & Weight     Self, Time, Situation, Place  Normal Continent Weight: 79.4 kg Height:  5\' 4"  (162.6 cm)  BEHAVIORAL SYMPTOMS/MOOD NEUROLOGICAL BOWEL NUTRITION STATUS      Continent Diet (see discharge summary)  AMBULATORY STATUS COMMUNICATION OF NEEDS Skin   Limited Assist Verbally Surgical wounds (left knee)                       Personal Care Assistance Level of Assistance   Bathing, Feeding, Dressing Bathing Assistance: Limited assistance Feeding assistance: Independent Dressing Assistance: Limited assistance     Functional Limitations Info  Sight, Hearing, Speech Sight Info: Impaired (glasses) Hearing Info: Adequate Speech Info: Adequate    SPECIAL CARE FACTORS FREQUENCY  PT (By licensed PT), OT (By licensed OT)     PT Frequency: 5 times per week OT Frequency: 5 times per week            Contractures Contractures Info: Not present    Additional Factors Info  Code Status, Allergies Code Status Info: Full Allergies Info: Sulfa, amoxicillin, valdecoxib           Current Medications (10/19/2021):  This is the current hospital active medication list Current Facility-Administered Medications  Medication Dose Route Frequency Provider Last Rate Last Admin   0.9 %  sodium chloride infusion   Intravenous Continuous Poggi, Marshall Cork, MD 75 mL/hr at 10/18/21 2323 New Bag at 10/18/21 2323   acetaminophen (TYLENOL) tablet 1,000 mg  1,000 mg Oral Q6H Poggi, Marshall Cork, MD   1,000 mg at 10/19/21 0431   acetaminophen (TYLENOL) tablet 325-650 mg  325-650 mg Oral Q6H PRN Poggi, Marshall Cork, MD       albuterol (PROVENTIL) (2.5 MG/3ML) 0.083% nebulizer solution 3 mL  3 mL Inhalation Q6H PRN Poggi, Marshall Cork, MD       amLODipine (NORVASC) tablet 10 mg  10 mg Oral Daily Poggi, Marshall Cork, MD   10 mg at 10/19/21 0936   apixaban (ELIQUIS) tablet 5 mg  5 mg Oral BID Poggi, Marshall Cork, MD   5 mg at 10/19/21 0998   ascorbic acid (VITAMIN C) tablet 500 mg  500 mg Oral Daily Poggi, Marshall Cork, MD   500 mg at 10/19/21 0936   atorvastatin (LIPITOR) tablet 5 mg  5 mg Oral QHS Poggi, Marshall Cork, MD   5 mg at 10/18/21 2221   bisacodyl (DULCOLAX) suppository 10 mg  10 mg Rectal Daily PRN Poggi, Marshall Cork, MD       cyclobenzaprine (FLEXERIL) tablet 5 mg  5 mg Oral TID PRN Lattie Corns, PA-C       diphenhydrAMINE (BENADRYL) 12.5 MG/5ML elixir 12.5-25 mg  12.5-25 mg Oral Q4H PRN Poggi, Marshall Cork, MD        docusate sodium (COLACE) capsule 100 mg  100 mg Oral BID Corky Mull, MD   100 mg at 10/19/21 3382   ferrous sulfate tablet 325 mg  325 mg Oral Q breakfast Poggi, Marshall Cork, MD   325 mg at 10/19/21 0759   gabapentin (NEURONTIN) 250 MG/5ML solution 50 mg  50 mg Oral TID PRN Poggi, Marshall Cork, MD       HYDROmorphone (DILAUDID) injection 0.25-0.5 mg  0.25-0.5 mg Intravenous Q2H PRN Poggi, Marshall Cork, MD       loratadine (CLARITIN) tablet 10 mg  10 mg Oral Daily Poggi, Marshall Cork, MD   10 mg at 10/19/21 5053   magnesium hydroxide (MILK OF MAGNESIA) suspension 30 mL  30 mL Oral Daily PRN Poggi, Marshall Cork, MD       metoCLOPramide (REGLAN) tablet 5-10 mg  5-10 mg Oral Q8H PRN Poggi, Marshall Cork, MD       Or   metoCLOPramide (REGLAN) injection 5-10 mg  5-10 mg Intravenous Q8H PRN Poggi, Marshall Cork, MD       mometasone-formoterol (DULERA) 100-5 MCG/ACT inhaler 2 puff  2 puff Inhalation BID Poggi, Marshall Cork, MD   2 puff at 10/19/21 0759   ondansetron (ZOFRAN) tablet 4 mg  4 mg Oral Q6H PRN Poggi, Marshall Cork, MD       Or   ondansetron (ZOFRAN) injection 4 mg  4 mg Intravenous Q6H PRN Poggi, Marshall Cork, MD       oxyCODONE (Oxy IR/ROXICODONE) immediate release tablet 5-10 mg  5-10 mg Oral Q4H PRN Poggi, Marshall Cork, MD   5 mg at 10/19/21 0934   PARoxetine (PAXIL) tablet 30 mg  30 mg Oral Daily Poggi, Marshall Cork, MD   30 mg at 10/19/21 0935   potassium chloride (KLOR-CON) CR tablet 10 mEq  10 mEq Oral Daily Poggi, Marshall Cork, MD   10 mEq at 10/19/21 0936   sodium phosphate (FLEET) 7-19 GM/118ML enema 1 enema  1 enema Rectal Once PRN Poggi, Marshall Cork, MD       traMADol Veatrice [place]) tablet 50 mg  50 mg Oral Q6H PRN Poggi, Marshall Cork, MD         Discharge Medications: Please see discharge summary for a list of discharge medications.  Relevant Imaging Results:  Relevant Lab Results:   Additional Information SS# [id number]  Shelbie Hutching, RN

## 2021-10-20 DIAGNOSIS — M1712 Unilateral primary osteoarthritis, left knee: Secondary | ICD-10-CM | POA: Diagnosis not present

## 2021-10-20 LAB — BASIC METABOLIC PANEL
Anion gap: 5 (ref 5–15)
BUN: 13 mg/dL (ref 8–23)
CO2: 28 mmol/L (ref 22–32)
Calcium: 8.4 mg/dL — ABNORMAL LOW (ref 8.9–10.3)
Chloride: 107 mmol/L (ref 98–111)
Creatinine, Ser: 0.72 mg/dL (ref 0.44–1.00)
GFR, Estimated: >60 mL/min (ref >60)
Glucose, Bld: 101 mg/dL — ABNORMAL HIGH (ref 70–99)
Potassium: 3.8 mmol/L (ref 3.5–5.1)
Sodium: 140 mmol/L (ref 135–145)

## 2021-10-20 LAB — CBC
HCT: 34.5 % — ABNORMAL LOW (ref 36.0–46.0)
Hemoglobin: 10.3 g/dL — ABNORMAL LOW (ref 12.0–15.0)
MCH: 24.3 pg — ABNORMAL LOW (ref 26.0–34.0)
MCHC: 29.9 g/dL — ABNORMAL LOW (ref 30.0–36.0)
MCV: 81.6 fL (ref 80.0–100.0)
Platelets: 170 10*3/uL (ref 150–400)
RBC: 4.23 MIL/uL (ref 3.87–5.11)
RDW: 18.7 % — ABNORMAL HIGH (ref 11.5–15.5)
WBC: 7.9 10*3/uL (ref 4.0–10.5)
nRBC: 0 % (ref 0.0–0.2)

## 2021-10-20 MED ORDER — ACETAMINOPHEN 500 MG PO TABS: 500.0000 mg | ORAL_TABLET | Freq: Four times a day (QID) | ORAL | 0 refills | Status: AC | PRN

## 2021-10-20 MED ORDER — ONDANSETRON HCL 4 MG PO TABS: 4.0000 mg | ORAL_TABLET | Freq: Four times a day (QID) | ORAL | 0 refills | Status: DC | PRN

## 2021-10-20 MED ORDER — CYCLOBENZAPRINE HCL 5 MG PO TABS: 5.0000 mg | ORAL_TABLET | Freq: Three times a day (TID) | ORAL | 0 refills | Status: DC | PRN

## 2021-10-20 MED ORDER — TRAMADOL HCL 50 MG PO TABS
50.0000 mg | ORAL_TABLET | Freq: Four times a day (QID) | ORAL | 0 refills | Status: DC | PRN
Start: 2021-10-20 — End: 2022-10-27

## 2021-10-20 MED ORDER — OXYCODONE HCL 5 MG PO TABS: 5.0000 mg | ORAL_TABLET | ORAL | 0 refills | Status: DC | PRN

## 2021-10-20 MED ORDER — OXYCODONE HCL 5 MG PO TABS
ORAL_TABLET | ORAL | Status: AC
  Filled 2021-10-20: qty 2

## 2021-10-20 MED ORDER — FLEET ENEMA 7-19 GM/118ML RE ENEM
1.0000 | ENEMA | Freq: Once | RECTAL | Status: AC
  Administered 2021-10-20: 1 via RECTAL

## 2021-10-20 MED ORDER — DOCUSATE SODIUM 100 MG PO CAPS: 100.0000 mg | ORAL_CAPSULE | Freq: Two times a day (BID) | ORAL | 0 refills | Status: AC

## 2021-10-20 NOTE — Discharge Summary (Signed)
Physician Discharge Summary  Patient ID: [name] MRN: [id number] DOB/AGE: 1951/10/14 70 y.o.  Admit date: 10/18/2021 Discharge date: 10/20/2021  Admission Diagnoses:  Status post total knee replacement using cement, left [Z96.652]  Discharge Diagnoses: Patient Active Problem List   Diagnosis Date Noted   Status post total knee replacement using cement, left 10/18/2021   Anemia 07/07/2021   Mild protein-calorie malnutrition ([name]) 07/07/2021   Ulcer of great toe ([name]) 03/17/2021   Lymphedema 03/17/2021   Hypokalemia 11/02/2020   Hypomagnesemia 11/02/2020   Paroxysmal A-fib ([name]) 10/21/2020   Hyperlipidemia, mixed 10/21/2020   Chronic right shoulder pain 09/14/2020   Dysuria 09/14/2020   Fall 09/14/2020   Leukocytosis 09/14/2020   Dizziness 09/13/2020   Weakness 09/13/2020   Primary osteoarthritis of both knees 03/19/2017   Degenerative arthritis of right knee 07/23/2014   Allergic rhinitis 07/22/2014   Anxiety 07/22/2014   Benign essential hypertension 07/22/2014   GERD (gastroesophageal reflux disease) 07/22/2014    Past Medical History:  Diagnosis Date   Anemia    Anesthesia complication    a.) (+) delayed emergence   Anxiety    Asthma    Atrial fibrillation ([place])    a.) rate/rhythm controlled; no meds. b.) CHA2DS2-VASc = 3 (age, sex, HTN). c.) chronically anticoagulated with daily apixaban.   Cellulitis    CKD (chronic kidney disease), stage IV (HCC)    COPD (chronic obstructive pulmonary disease) (HCC)    GERD (gastroesophageal reflux disease)    History of MRSA infection    HLD (hyperlipidemia)    Hypertension    Incomplete right bundle branch block (RBBB)    Long term (current) use of anticoagulants    a.) Apixaban   Long term current use of immunosuppressive drug    a.) hydroxychloroquine for RA   Lymphedema    Mild protein-calorie malnutrition (HCC)    Osteoarthritis    Rheumatoid arthritis ([place])      Transfusion: None.   Consultants (if  any):   Discharged Condition: Improved  Hospital Course: [name] is an 70 y.o. female who was admitted 10/18/2021 with a diagnosis of degenerative joint disease of the left knee and went to the operating room on 10/18/2021 and underwent the above named procedures.    Surgeries: Procedure(s): TOTAL KNEE ARTHROPLASTY on 10/18/2021 Patient tolerated the surgery well. Taken to PACU where she was stabilized and then transferred to the orthopedic floor.  Continued on Eliquis 5mg  twice daily. Foot pumps applied bilaterally at 80 mm. Heels elevated on bed with rolled towels. No evidence of DVT. Negative Homan. Physical therapy started on day #1 for gait training and transfer. OT started day #1 for ADL and assisted devices.  Patient's IV was removed on POD2.  Implants: Left TKA using all-cemented [employer] Vanguard system with a 67.5 mm mm PCR femur, a 71 mm tibial tray with a 12 mm anterior stabilized E-poly insert, and a 34 x 7.8 mm all-poly 3-pegged domed patella.    She was given perioperative antibiotics:  Anti-infectives (From admission, onward)    Start     Dose/Rate Route Frequency Ordered Stop   10/18/21 1300  ceFAZolin (ANCEF) IVPB 2g/100 mL premix        2 g 200 mL/hr over 30 Minutes Intravenous Every 6 hours 10/18/21 1227 10/19/21 0730   10/18/21 0621  clindamycin (CLEOCIN) 900 MG/50ML IVPB       Note to Pharmacy: Herby Abraham   : cabinet override      10/18/21 0621 10/18/21  0820   10/18/21 0600  clindamycin (CLEOCIN) IVPB 900 mg        900 mg 100 mL/hr over 30 Minutes Intravenous On call to O.R. 10/18/21 0153 10/18/21 0805     .  She was given sequential compression devices, early ambulation, and Eliquis for DVT prophylaxis.  She benefited maximally from the hospital stay and there were no complications.    Recent vital signs:  Vitals:   10/19/21 2000 10/20/21 0400  BP: 123/64 133/70  Pulse: 73 76  Resp: 17 18  Temp: 97.9 F (36.6 C) 97.6 F (36.4 C)  SpO2: 94%  98%   Recent laboratory studies:  Lab Results  Component Value Date   HGB 10.3 (L) 10/20/2021   HGB 10.5 (L) 10/19/2021   HGB 12.9 10/18/2021   Lab Results  Component Value Date   WBC 7.9 10/20/2021   PLT 170 10/20/2021   Lab Results  Component Value Date   INR 1.5 (H) 10/12/2021   Lab Results  Component Value Date   NA 140 10/20/2021   K 3.8 10/20/2021   CL 107 10/20/2021   CO2 28 10/20/2021   BUN 13 10/20/2021   CREATININE 0.72 10/20/2021   GLUCOSE 101 (H) 10/20/2021   Discharge Medications:   Allergies as of 10/20/2021       Reactions   Sulfa Antibiotics Hives, Itching   Amoxicillin-pot Clavulanate Nausea Only   Valdecoxib Rash        Medication List     STOP taking these medications    acetaminophen 650 MG CR tablet Commonly known as: TYLENOL Replaced by: acetaminophen 500 MG tablet   HYDROcodone-acetaminophen 5-325 MG tablet Commonly known as: NORCO/VICODIN   oxyCODONE-acetaminophen 5-325 MG tablet Commonly known as: Percocet       TAKE these medications    acetaminophen 500 MG tablet Commonly known as: TYLENOL Take 1-2 tablets (500-1,000 mg total) by mouth every 6 (six) hours as needed for mild pain (pain score 1-3 or temp > 100.5). Replaces: acetaminophen 650 MG CR tablet   albuterol 108 (90 Base) MCG/ACT inhaler Commonly known as: VENTOLIN HFA Inhale 2 puffs into the lungs every 6 (six) hours as needed for wheezing or shortness of breath.   amLODipine 10 MG tablet Commonly known as: NORVASC Take 10 mg by mouth daily.   apixaban 5 MG Tabs tablet Commonly known as: ELIQUIS Take 5 mg by mouth 2 (two) times daily.   ascorbic acid 500 MG tablet Commonly known as: VITAMIN C Take 500 mg by mouth daily.   atorvastatin 10 MG tablet Commonly known as: LIPITOR Take 5 mg by mouth at bedtime.   budesonide-formoterol 80-4.5 MCG/ACT inhaler Commonly known as: SYMBICORT Inhale 2 puffs into the lungs 2 (two) times daily as needed (shortness  of breath or wheezing).   cyclobenzaprine 5 MG tablet Commonly known as: FLEXERIL Take 1 tablet (5 mg total) by mouth 3 (three) times daily as needed for muscle spasms.   docusate sodium 100 MG capsule Commonly known as: COLACE Take 1 capsule (100 mg total) by mouth 2 (two) times daily.   ferrous sulfate 325 (65 FE) MG tablet Take 325 mg by mouth daily with breakfast.   gabapentin 250 MG/5ML solution Commonly known as: NEURONTIN Take 50 mg by mouth 3 (three) times daily as needed (pain).   hydroxychloroquine 200 MG tablet Commonly known as: PLAQUENIL Take 200 mg by mouth 2 (two) times daily.   loratadine 10 MG tablet Commonly known as: CLARITIN Take 10  mg by mouth daily.   ondansetron 4 MG tablet Commonly known as: ZOFRAN Take 1 tablet (4 mg total) by mouth every 6 (six) hours as needed for nausea.   oxyCODONE 5 MG immediate release tablet Commonly known as: Oxy IR/ROXICODONE Take 1-2 tablets (5-10 mg total) by mouth every 4 (four) hours as needed for severe pain (Pain score 5-9).   PARoxetine 30 MG tablet Commonly known as: PAXIL Take 30 mg by mouth daily.   potassium chloride 10 MEQ tablet Commonly known as: KLOR-CON Take 10 mEq by mouth daily.   traMADol 50 MG tablet Commonly known as: ULTRAM Take 1 tablet (50 mg total) by mouth every 6 (six) hours as needed for moderate pain.               Durable Medical Equipment  (From admission, onward)           Start     Ordered   10/18/21 1228  DME Bedside commode  Once       Question:  Patient needs a bedside commode to treat with the following condition  Answer:  Status post total knee replacement using cement, left   10/18/21 1227   10/18/21 1228  DME 3 n 1  Once        10/18/21 1227   10/18/21 1228  DME Walker rolling  Once       Question Answer Comment  Walker: With 5 Inch Wheels   Patient needs a walker to treat with the following condition Status post total knee replacement using cement, left       10/18/21 1227           Diagnostic Studies: DG Knee Left Port  Result Date: 10/18/2021 CLINICAL DATA:  Left total knee replacement EXAM: PORTABLE LEFT KNEE - 1-2 VIEW COMPARISON:  None. FINDINGS: There is a 3 component total knee arthroplasty normal alignment thought evidence of loosening or fracture. Expected soft tissue changes. IMPRESSION: Three component total knee arthroplasty without evidence of immediate hardware complication. Electronically Signed   By: Maurine Simmering M.D.   On: 10/18/2021 10:58    Disposition: Plan for discharge to SNF today.   Contact information for follow-up providers     Watt Climes, PA Follow up in 14 day(s).   Specialty: Physician Assistant Why: Electa Sniff information: [address] [place] [address] [phone]              Contact information for after-discharge care     Destination     [facility] .   Service: Skilled Nursing Contact information: [place] [phone]                    Signed: Judson Roch PA-C 10/20/2021, 7:35 AM

## 2021-10-20 NOTE — Discharge Instructions (Signed)

## 2021-10-20 NOTE — TOC Transition Note (Signed)
Transition of Care [facility]) - CM/SW Discharge Note   Patient Details  Name: [name] MRN: [id number] Date of Birth: 1951/11/12  [facility] CM/SW Contact:  Shelbie Hutching, RN Phone Number: 10/20/2021, 8:51 AM   Clinical Narrative:    Patient medically cleared for discharge to [facility] today.  Patient will be going to the 600 hall.  Bedside RN will call report to [phone].  RNCM has arranged EMS transport.  Patient is 2nd on the list, patient should be ready for pick up by 0930.     Final next level of care: Skilled Nursing Facility Barriers to Discharge: Barriers Resolved   Patient Goals and CMS Choice Patient states their goals for this hospitalization and ongoing recovery are:: Patient wants to go for short term rehab.  Prefers [email or web address] Compare Post Acute Care list provided to:: Patient Choice offered to / list presented to : Patient  Discharge Placement   Existing PASRR number confirmed : 10/19/21          Patient [facility] bed at: [facility] Patient to be transferred to facility by: [facility] Name of family member notified: Judson Roch (daughter) Patient and family notified of of transfer: 10/20/21  Discharge Plan and Services   Discharge Planning Services: CM Consult Post Acute Care Choice: [place]          DME Arranged: N/A DME Agency: NA       HH Arranged: NA [place] Agency: NA        Social Determinants of Health ([place]) Interventions     Readmission Risk Interventions No flowsheet data found.

## 2021-10-20 NOTE — Progress Notes (Signed)
Plans to d/c pt today to [facility].  VSS.  Pain 2/10.  [place].  Waiting for EMS to transport pt.

## 2021-10-20 NOTE — Progress Notes (Signed)
Subjective: 2 Days Post-Op Procedure(s) (LRB): TOTAL KNEE ARTHROPLASTY (Left) Patient reports pain as moderate this morning. Patient is well, and has had no acute complaints or problems Plan is to go Skilled nursing facility after hospital stay.  Plan is for d/c to SNF today. Negative for chest pain and shortness of breath Fever: no Gastrointestinal:Negatve for nausea and vomiting Patient is passing gas but has not had a BM yet.  Objective: Vital signs in last 24 hours: Temp:  [97.6 F (36.4 C)-97.9 F (36.6 C)] 97.6 F (36.4 C) (11/03 0400) Pulse Rate:  [70-76] 76 (11/03 0400) Resp:  [17-18] 18 (11/03 0400) BP: (123-133)/(64-70) 133/70 (11/03 0400) SpO2:  [94 %-100 %] 98 % (11/03 0400)  Intake/Output from previous day:  Intake/Output Summary (Last 24 hours) at 10/20/2021 0729 Last data filed at 10/20/2021 0430 Gross per 24 hour  Intake 60 ml  Output 1200 ml  Net -1140 ml    Intake/Output this shift: No intake/output data recorded.  Labs: Recent Labs    10/18/21 0641 10/19/21 0415 10/20/21 0420  HGB 12.9 10.5* 10.3*   Recent Labs    10/19/21 0415 10/20/21 0420  WBC 8.3 7.9  RBC 4.27 4.23  HCT 34.4* 34.5*  PLT 174 170   Recent Labs    10/19/21 0415 10/20/21 0420  NA 139 140  K 4.0 3.8  CL 106 107  CO2 29 28  BUN 16 13  CREATININE 0.74 0.72  GLUCOSE 138* 101*  CALCIUM 8.3* 8.4*   No results for input(s): LABPT, INR in the last 72 hours.   EXAM General - Patient is Alert, Appropriate, and Oriented Extremity - ABD soft Neurovascular intact Dorsiflexion/Plantar flexion intact Incision: scant drainage No cellulitis present Dressing/Incision - Minimal blood tinged drainage Motor Function - intact, moving foot and toes well on exam.  Negative Homan's to bilateral lower extremities. Abdomen soft with normal bowel sounds.  Past Medical History:  Diagnosis Date   Anemia    Anesthesia complication    a.) (+) delayed emergence   Anxiety     Asthma    Atrial fibrillation ([place])    a.) rate/rhythm controlled; no meds. b.) CHA2DS2-VASc = 3 (age, sex, HTN). c.) chronically anticoagulated with daily apixaban.   Cellulitis    CKD (chronic kidney disease), stage IV (HCC)    COPD (chronic obstructive pulmonary disease) (HCC)    GERD (gastroesophageal reflux disease)    History of MRSA infection    HLD (hyperlipidemia)    Hypertension    Incomplete right bundle branch block (RBBB)    Long term (current) use of anticoagulants    a.) Apixaban   Long term current use of immunosuppressive drug    a.) hydroxychloroquine for RA   Lymphedema    Mild protein-calorie malnutrition (HCC)    Osteoarthritis    Rheumatoid arthritis (HCC)     Assessment/Plan: 2 Days Post-Op Procedure(s) (LRB): TOTAL KNEE ARTHROPLASTY (Left) Active Problems:   Status post total knee replacement using cement, left  Estimated body mass index is 30.04 kg/m as calculated from the following:   Height as of this encounter: 5\' 4"  (1.626 m).   Weight as of this encounter: 79.4 kg. Advance diet Up with therapy D/C IV fluids when tolerating po intake.  Labs reviewed this AM.  Hg 10.3 this morning. Patient is passing gas without pain.  No BM yet, Fleet enema has been ordered. Continue with therapy. Current plan is for d/c to SNF today.  Apply TED hose to bilateral legs  prior to discharge.  DVT Prophylaxis - Foot Pumps, TED hose, and Eliquis Weight-Bearing as tolerated to left leg  J. Cameron Proud, PA-C [facility] Orthopaedic Surgery 10/20/2021, 7:29 AM

## 2022-02-09 ENCOUNTER — Ambulatory Visit (INDEPENDENT_AMBULATORY_CARE_PROVIDER_SITE_OTHER): Payer: Medicare Other | Admitting: Vascular Surgery

## 2022-02-13 ENCOUNTER — Ambulatory Visit (INDEPENDENT_AMBULATORY_CARE_PROVIDER_SITE_OTHER): Payer: Medicare Other | Admitting: Vascular Surgery

## 2022-06-26 IMAGING — DX DG KNEE 1-2V PORT*L*
2 series · 2 of 2 positions shown · non-contrast
Comparison: None.

CLINICAL DATA: Left total knee replacement

EXAM:
PORTABLE LEFT KNEE - 1-2 VIEW

[knee ap]
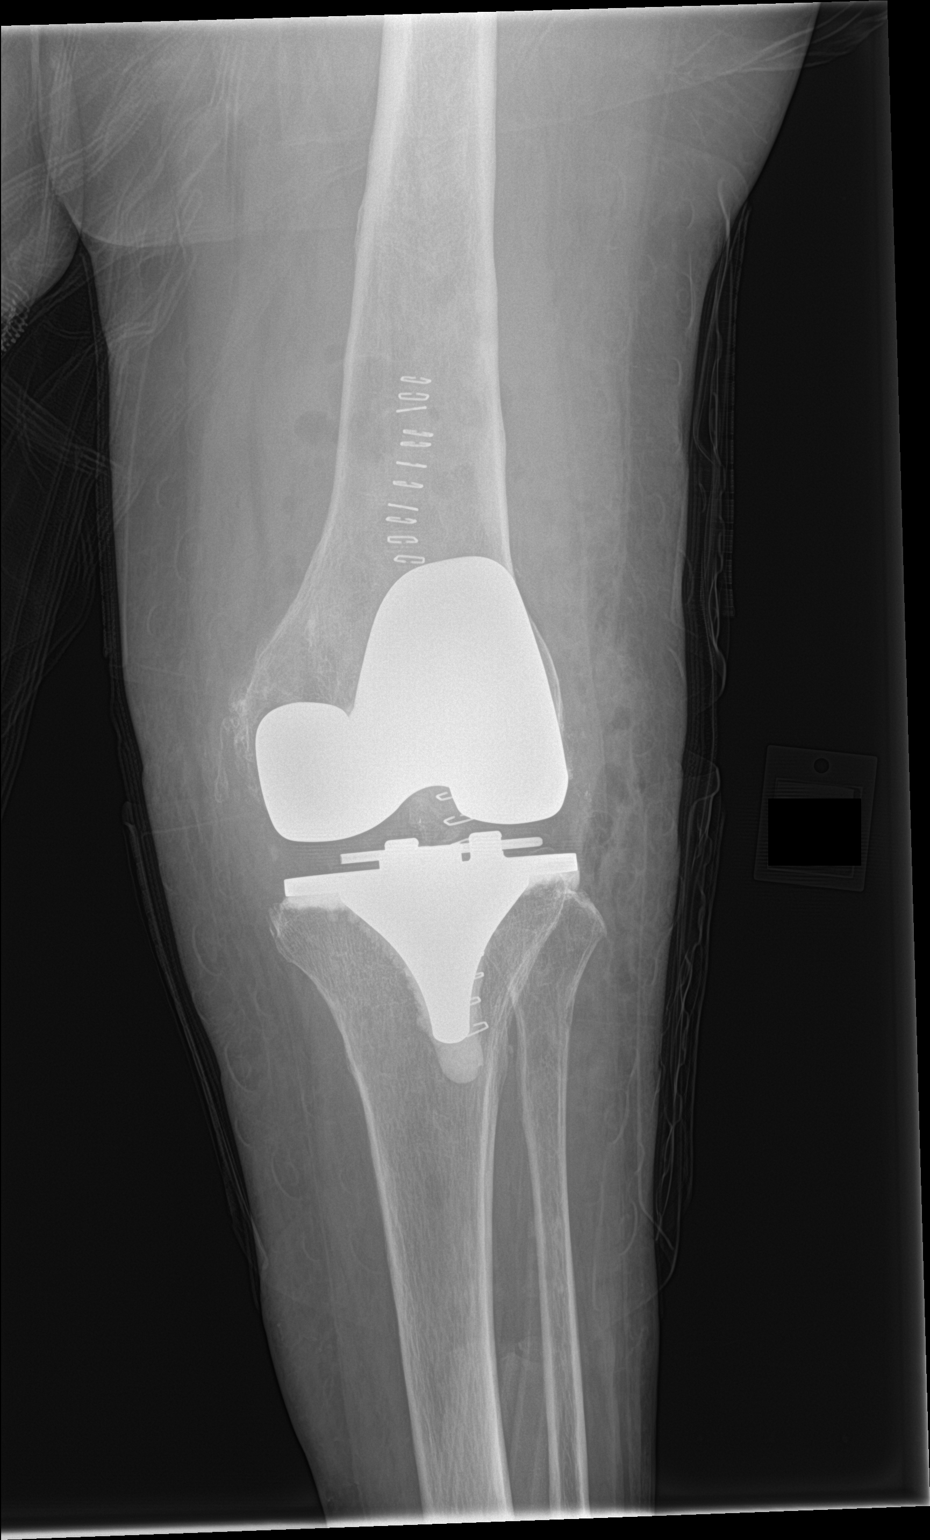

[knee lat]
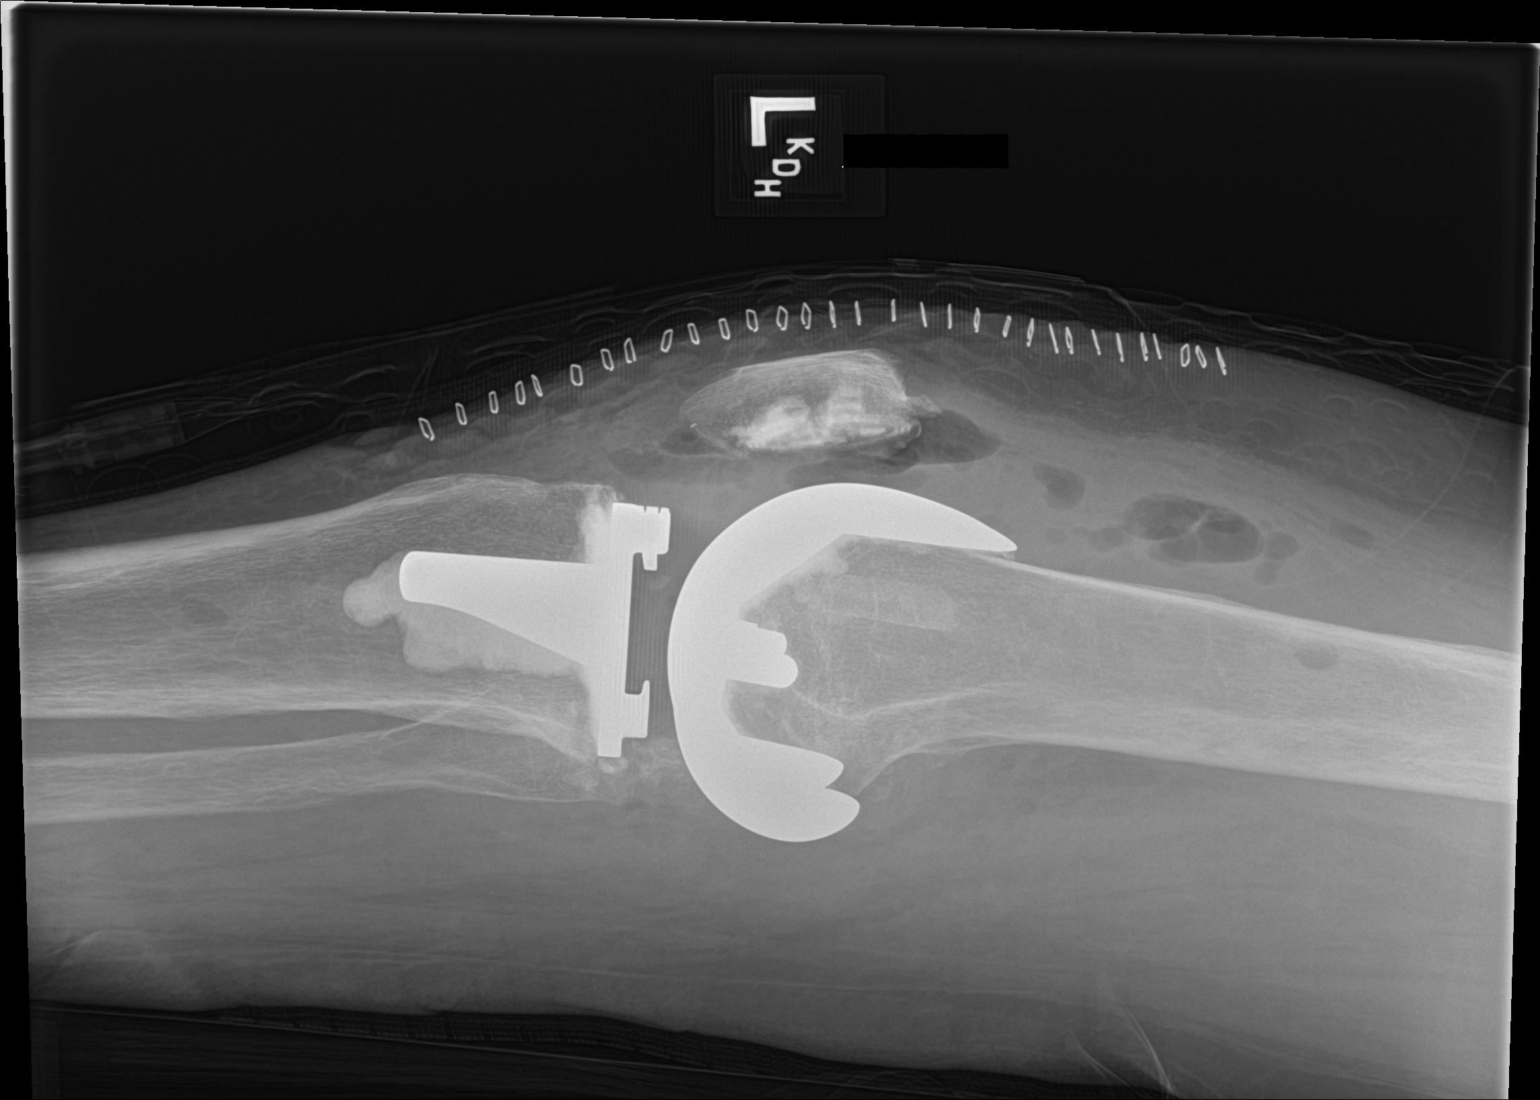

[2 of 2 positions shown; findings below may reference images not displayed]

FINDINGS: There is a 3 component total knee arthroplasty normal alignment
thought evidence of loosening or fracture. Expected soft tissue
changes.
IMPRESSION: Three component total knee arthroplasty without evidence of
immediate hardware complication.

## 2022-10-06 ENCOUNTER — Other Ambulatory Visit: Payer: Self-pay

## 2022-10-06 DIAGNOSIS — E041 Nontoxic single thyroid nodule: Secondary | ICD-10-CM

## 2022-10-09 ENCOUNTER — Other Ambulatory Visit: Payer: Self-pay | Admitting: Surgery

## 2022-10-11 ENCOUNTER — Ambulatory Visit
Admission: RE | Admit: 2022-10-11 | Discharge: 2022-10-11 | Disposition: A | Discharge status: Home / Self Care | Payer: Medicare Other | Source: Ambulatory Visit | Attending: Family Medicine | Admitting: Family Medicine

## 2022-10-11 DIAGNOSIS — E041 Nontoxic single thyroid nodule: Secondary | ICD-10-CM | POA: Diagnosis present

## 2022-10-12 ENCOUNTER — Encounter
Admission: RE | Admit: 2022-10-12 | Discharge: 2022-10-12 | Disposition: A | Discharge status: Home / Self Care | Payer: Medicare Other | Source: Ambulatory Visit | Attending: Surgery | Admitting: Surgery

## 2022-10-12 ENCOUNTER — Other Ambulatory Visit: Payer: Self-pay

## 2022-10-12 VITALS — BP 146/74 | HR 76 | Resp 18 | Ht 65.0 in

## 2022-10-12 DIAGNOSIS — Z01818 Encounter for other preprocedural examination: Secondary | ICD-10-CM | POA: Insufficient documentation

## 2022-10-12 DIAGNOSIS — Z01812 Encounter for preprocedural laboratory examination: Secondary | ICD-10-CM

## 2022-10-12 LAB — URINALYSIS, ROUTINE W REFLEX MICROSCOPIC
Bacteria, UA: NONE SEEN
Bilirubin Urine: NEGATIVE
Glucose, UA: NEGATIVE mg/dL
Hgb urine dipstick: NEGATIVE
Ketones, ur: NEGATIVE mg/dL
Nitrite: NEGATIVE
Protein, ur: NEGATIVE mg/dL
Specific Gravity, Urine: 1.025 (ref 1.005–1.030)
pH: 5 (ref 5.0–8.0)

## 2022-10-12 LAB — SURGICAL PCR SCREEN
MRSA, PCR: NEGATIVE
Staphylococcus aureus: NEGATIVE

## 2022-10-12 LAB — COMPREHENSIVE METABOLIC PANEL
ALT: 25 U/L (ref 0–44)
AST: 36 U/L (ref 15–41)
Albumin: 3.7 g/dL (ref 3.5–5.0)
Alkaline Phosphatase: 181 U/L — ABNORMAL HIGH (ref 38–126)
Anion gap: 8 (ref 5–15)
BUN: 18 mg/dL (ref 8–23)
CO2: 25 mmol/L (ref 22–32)
Calcium: 9 mg/dL (ref 8.9–10.3)
Chloride: 110 mmol/L (ref 98–111)
Creatinine, Ser: 0.92 mg/dL (ref 0.44–1.00)
GFR, Estimated: >60 mL/min (ref >60)
Glucose, Bld: 88 mg/dL (ref 70–99)
Potassium: 3.6 mmol/L (ref 3.5–5.1)
Sodium: 143 mmol/L (ref 135–145)
Total Bilirubin: 0.6 mg/dL (ref 0.3–1.2)
Total Protein: 7.4 g/dL (ref 6.5–8.1)

## 2022-10-12 LAB — CBC WITH DIFFERENTIAL/PLATELET
Abs Immature Granulocytes: 0.01 10*3/uL (ref 0.00–0.07)
Basophils Absolute: 0.1 10*3/uL (ref 0.0–0.1)
Basophils Relative: 1 %
Eosinophils Absolute: 0.4 10*3/uL (ref 0.0–0.5)
Eosinophils Relative: 10 %
HCT: 38.6 % (ref 36.0–46.0)
Hemoglobin: 12.1 g/dL (ref 12.0–15.0)
Immature Granulocytes: 0 %
Lymphocytes Relative: 25 %
Lymphs Abs: 1.1 10*3/uL (ref 0.7–4.0)
MCH: 27.1 pg (ref 26.0–34.0)
MCHC: 31.3 g/dL (ref 30.0–36.0)
MCV: 86.4 fL (ref 80.0–100.0)
Monocytes Absolute: 0.6 10*3/uL (ref 0.1–1.0)
Monocytes Relative: 14 %
Neutro Abs: 2.1 10*3/uL (ref 1.7–7.7)
Neutrophils Relative %: 50 %
Platelets: 185 10*3/uL (ref 150–400)
RBC: 4.47 MIL/uL (ref 3.87–5.11)
RDW: 18.1 % — ABNORMAL HIGH (ref 11.5–15.5)
WBC: 4.2 10*3/uL (ref 4.0–10.5)
nRBC: 0 % (ref 0.0–0.2)

## 2022-10-12 LAB — TYPE AND SCREEN
ABO/RH(D): O NEG
Antibody Screen: NEGATIVE

## 2022-10-12 NOTE — Patient Instructions (Addendum)
Your procedure is scheduled on: 10/24/22 - Tuesday Report to the Registration Desk on the 1st floor of the [facility]. To find out your arrival time, please call [phone] between 1PM - 3PM on: 10/23/22 - Monday If your arrival time is 6:00 am, do not arrive prior to that time as the [facility] entrance doors do not open until 6:00 am.  REMEMBER: Instructions that are not followed completely may result in serious medical risk, up to and including death; or upon the discretion of your surgeon and anesthesiologist your surgery may need to be rescheduled.  Do not eat food after midnight the night before surgery.  No gum chewing, lozengers or hard candies.  You may however, drink CLEAR liquids up to 2 hours before you are scheduled to arrive for your surgery. Do not drink anything within 2 hours of your scheduled arrival time.  Clear liquids include: - water  - apple juice without pulp - gatorade (not RED colors) - black coffee or tea (Do NOT add milk or creamers to the coffee or tea) Do NOT drink anything that is not on this list.  In addition, your doctor has ordered for you to drink the provided  Ensure Pre-Surgery Clear Carbohydrate Drink  Drinking this carbohydrate drink up to two hours before surgery helps to reduce insulin resistance and improve patient outcomes. Please complete drinking 2 hours prior to scheduled arrival time.  TAKE THESE MEDICATIONS THE MORNING OF SURGERY WITH A SIP OF WATER:  - amLODipine (NORVASC) - hydroxychloroquine (PLAQUENIL)  - PARoxetine (PAXIL)  - potassium chloride   Use budesonide-formoterol (SYMBICORT) and albuterol (VENTOLIN on the day of surgery and bring to the hospital.  HOLD apixaban (ELIQUIS)   3  days prior to your surgery.- beginning 10/21/22 .  One week prior to surgery: Stop Anti-inflammatories (NSAIDS) such as Advil, Aleve, Ibuprofen, Motrin, Naproxen, Naprosyn and Aspirin based products such as Excedrin, Goodys Powder, BC  Powder.  Stop ANY OVER THE COUNTER supplements until after surgery.  You may however, continue to take Tylenol if needed for pain up until the day of surgery.  No Alcohol for 24 hours before or after surgery.  No Smoking including e-cigarettes for 24 hours prior to surgery.  No chewable tobacco products for at least 6 hours prior to surgery.  No nicotine patches on the day of surgery.  Do not use any "recreational" drugs for at least a week prior to your surgery.  Please be advised that the combination of cocaine and anesthesia may have negative outcomes, up to and including death. If you test positive for cocaine, your surgery will be cancelled.  On the morning of surgery brush your teeth with toothpaste and water, you may rinse your mouth with mouthwash if you wish. Do not swallow any toothpaste or mouthwash.  Use CHG Soap or wipes as directed on instruction sheet.  Do not wear jewelry, make-up, hairpins, clips or nail polish.  Do not wear lotions, powders, or perfumes.   Do not shave body from the neck down 48 hours prior to surgery just in case you cut yourself which could leave a site for infection.  Also, freshly shaved skin may become irritated if using the CHG soap.  Contact lenses, hearing aids and dentures may not be worn into surgery.  Do not bring valuables to the hospital. [facility] is not responsible for any missing/lost belongings or valuables.   Notify your doctor if there is any change in your medical condition (cold,  fever, infection).  Wear comfortable clothing (specific to your surgery type) to the hospital.  After surgery, you can help prevent lung complications by doing breathing exercises.  Take deep breaths and cough every 1-2 hours. Your doctor may order a device called an Incentive Spirometer to help you take deep breaths. When coughing or sneezing, hold a pillow firmly against your incision with both hands. This is called "splinting." Doing this  helps protect your incision. It also decreases belly discomfort.  If you are being admitted to the hospital overnight, leave your suitcase in the car. After surgery it may be brought to your room.  If you are being discharged the day of surgery, you will not be allowed to drive home. You will need a responsible adult (18 years or older) to drive you home and stay with you that night.   If you are taking public transportation, you will need to have a responsible adult (18 years or older) with you. Please confirm with your physician that it is acceptable to use public transportation.   Please call the [facility] Dept. at [phone] if you have any questions about these instructions.  Surgery Visitation Policy:  Patients undergoing a surgery or procedure may have two family members or support persons with them as long as the person is not COVID-19 positive or experiencing its symptoms.   Inpatient Visitation:    Visiting hours are 7 a.m. to 8 p.m. Up to four visitors are allowed at one time in a patient room, including children. The visitors may rotate out with other people during the day. One designated support person (adult) may remain overnight.

## 2022-10-16 ENCOUNTER — Encounter (INDEPENDENT_AMBULATORY_CARE_PROVIDER_SITE_OTHER): Payer: Self-pay

## 2022-10-18 ENCOUNTER — Encounter: Payer: Self-pay | Admitting: Surgery

## 2022-10-18 NOTE — Progress Notes (Signed)
Perioperative Services  Pre-Admission/Anesthesia Testing Clinical Review  Date: 10/18/22  Patient Demographics:  Name: [name] DOB:   1951/02/04 MRN:   [id number]  Planned Surgical Procedure(s):    Case: [id number] Date/Time: 10/24/22 0715   Procedure: TOTAL KNEE ARTHROPLASTY (Right: Knee)   Anesthesia type: Choice   Pre-op diagnosis: Primary osteoarthritis of right knee M17.11   Location: [facility] OR ROOM 03 / [place] ORS FOR ANESTHESIA GROUP   Surgeons: Corky Mull, MD   NOTE: Available PAT nursing documentation and vital signs have been reviewed. Clinical nursing staff has updated patient's PMH/PSHx, current medication list, and drug allergies/intolerances to ensure comprehensive history available to assist in medical decision making as it pertains to the aforementioned surgical procedure and anticipated anesthetic course. Extensive review of available clinical information performed. [place] PM[facility] and PSHx updated with any diagnoses/procedures that  may have been inadvertently omitted during her intake with the pre-admission testing department's nursing staff.  Clinical Discussion:  [name] is a 71 y.o. female who is submitted for pre-surgical anesthesia review and clearance prior to her undergoing the above procedure. Patient has never been a smoker. Pertinent PMH includes: atrial fibrillation, IRBBB, HTN, HLD, multinodular thyroid, CKD-IV, asthma, COPD, GERD (no daily Tx), anemia, OA, RA, lymphedema, anxiety.   Patient is followed by cardiology Nehemiah Massed, MD). She was last seen in the cardiology clinic on 10/12/2022 notes reviewed. At the time of her clinic visit, patient doing well overall from a cardiovascular perspective.  She denied any chest pain, PND, orthopnea, significant peripheral edema, palpitations, vertiginous symptoms, or presyncope/syncope.  Patient with chronic shortness of breath that was reported to be stable and at baseline. Patient with a PMH significant for  cardiovascular diagnoses.    Most recent TTE was performed on 01/27/2022 revealing a normal left ventricular systolic function with mild LVH; LVEF >55%.  Right ventricular size and function normal.  Aortic valve sclerotic.  There was trivial mitral and tricuspid valve regurgitation.  There was no evidence of any significant transvalvular gradient suggestive of stenosis.  Patient with a history of rate controlled atrial fibrillation. Patient does not require any type of pharmacological intervention for rate/rhythm control. CHA2DS2-VASc Score = 3 (age, sex, HTN). She is chronically anticoagulated with standard dose apixaban, and is reported to be compliant with therapy with no evidence or reports of GI bleeding. Blood pressure well controlled at 120/70 on currently prescribed CCB (amlodipine) monotherapy.  Patient is on atorvastatin for her HLD diagnosis and ASCVD prevention.  She is not diabetic. Patient does not have an OSAH diagnosis. Functional capacity, as defined by DASI, is documented as being >/= 4 METS.  No changes were made to her medication regimen.  Patient to follow-up with outpatient cardiology in 9 months or sooner if needed  [name] is scheduled for an elective RIGHT TOTAL KNEE ARTHROPLASTY on 10/24/2022 with Dr. Milagros Evener, MD. Given patient's past medical history significant for cardiovascular diagnoses, presurgical cardiac clearance was sought by the PAT team. Per cardiology, "this patient is optimized for surgery and may proceed with the planned procedural course with an ACCEPTABLE risk of significant perioperative cardiovascular complications". Again, this patient is on daily anticoagulation therapy. She has been instructed on recommendations for holding her apixaban for 3 days prior to her procedure with plans to restart as soon as postoperative bleeding risk felt to be minimized by her attending surgeon. The patient has been instructed that her last dose of her anticoagulant will be  on 10/20/2022  Patient reports previous perioperative complications with anesthesia in the past.  Patient reports a history of (+) delayed emergence from general anesthesia in the past.  In review of the patient's EMR, it was noted that patient underwent a neuraxial anesthetic course here at [facility] in 10/2021 (ASA III) with no documented complications/adverse reactions.     10/12/2022   11:12 AM 10/20/2021    7:42 AM 10/20/2021    4:00 AM  Vitals with BMI  Height 5\' 5"     Systolic [phone]  Diastolic 74 65 70  Pulse 76 78 76    Providers/Specialists:   NOTE: Primary physician provider listed below. Patient may have been seen by APP or partner within same practice.   PROVIDER ROLE / SPECIALTY LAST OV  Poggi, Marshall Cork, MD Orthopedics (Surgeon) 10/04/2022  Sofie Hartigan, MD Primary Care Provider 08/02/2022  Serafina Royals, MD Cardiology 10/12/2022  Ephriam Jenkins, MD Rheumatology 09/21/2022   Allergies:  Sulfa antibiotics, Amoxicillin-pot clavulanate, and Valdecoxib  Current Home Medications:   No current facility-administered medications for this encounter.    acetaminophen (TYLENOL) 500 MG tablet   albuterol (VENTOLIN HFA) 108 (90 Base) MCG/ACT inhaler   amLODipine (NORVASC) 10 MG tablet   apixaban (ELIQUIS) 5 MG TABS tablet   atorvastatin (LIPITOR) 10 MG tablet   budesonide-formoterol (SYMBICORT) 80-4.5 MCG/ACT inhaler   cyclobenzaprine (FLEXERIL) 5 MG tablet   docusate sodium (COLACE) 100 MG capsule   ferrous sulfate 325 (65 FE) MG tablet   folic acid (FOLVITE) 1 MG tablet   hydroxychloroquine (PLAQUENIL) 200 MG tablet   hydrOXYzine (ATARAX) 25 MG tablet   loratadine (CLARITIN) 10 MG tablet   methotrexate (RHEUMATREX) 2.5 MG tablet   mometasone (ELOCON) 0.1 % ointment   PARoxetine (PAXIL) 30 MG tablet   potassium chloride (KLOR-CON) 10 MEQ tablet   traMADol (ULTRAM) 50 MG tablet   triamcinolone (KENALOG) 0.025 % cream    triamcinolone cream (KENALOG) 0.1 %   History:   Past Medical History:  Diagnosis Date   Anemia    Anesthesia complication    a.) (+) delayed emergence   Anxiety    Asthma    Atrial fibrillation (HCC)    a.) CHA2DS2VASc = 3 (age, sex, HTN);  b.) rate/rhythm maintained without pharmacological interventions; chronically anticoagulated with apixaban   Cellulitis    CKD (chronic kidney disease), stage IV (HCC)    COPD (chronic obstructive pulmonary disease) (HCC)    GERD (gastroesophageal reflux disease)    History of MRSA infection    HLD (hyperlipidemia)    Hypertension    Incomplete right bundle branch block (RBBB)    Long term (current) use of anticoagulants    a.) Apixaban   Long term current use of immunosuppressive drug    a.) MTX + hydroxychloroquine for RA   Lymphedema    Mild protein-calorie malnutrition (HCC)    Multinodular thyroid    Osteoarthritis    Rheumatoid arthritis ([place])    Past Surgical History:  Procedure Laterality Date   CESAREAN SECTION     CHOLECYSTECTOMY N/A 07/31/2006   FOOT SURGERY Left    MOUTH SURGERY     TOTAL KNEE ARTHROPLASTY Left 10/18/2021   Procedure: TOTAL KNEE ARTHROPLASTY;  Surgeon: Corky Mull, MD;  Location: [facility] ORS;  Service: Orthopedics;  Laterality: Left;   WISDOM TOOTH EXTRACTION     Family History  Problem Relation Age of Onset   Diabetes Mother    Hypertension Mother  Hypertension Father    Social History   Tobacco Use   Smoking status: Never   Smokeless tobacco: Never  Vaping Use   Vaping Use: Never used  Substance Use Topics   Alcohol use: Never   Drug use: Never    Pertinent Clinical Results:  LABS: Labs reviewed: Acceptable for surgery.  Component Date Value Ref Range Status   WBC 10/12/2022 4.2  4.0 - 10.5 K/uL Final   RBC 10/12/2022 4.47  3.87 - 5.11 MIL/uL Final   Hemoglobin 10/12/2022 12.1  12.0 - 15.0 g/dL Final   HCT 10/12/2022 38.6  36.0 - 46.0 % Final   MCV 10/12/2022 86.4  80.0 - 100.0 fL  Final   MCH 10/12/2022 27.1  26.0 - 34.0 pg Final   MCHC 10/12/2022 31.3  30.0 - 36.0 g/dL Final   RDW 10/12/2022 18.1 (H)  11.5 - 15.5 % Final   Platelets 10/12/2022 185  150 - 400 K/uL Final   nRBC 10/12/2022 0.0  0.0 - 0.2 % Final   Neutrophils Relative % 10/12/2022 50  % Final   Neutro Abs 10/12/2022 2.1  1.7 - 7.7 K/uL Final   Lymphocytes Relative 10/12/2022 25  % Final   Lymphs Abs 10/12/2022 1.1  0.7 - 4.0 K/uL Final   Monocytes Relative 10/12/2022 14  % Final   Monocytes Absolute 10/12/2022 0.6  0.1 - 1.0 K/uL Final   Eosinophils Relative 10/12/2022 10  % Final   Eosinophils Absolute 10/12/2022 0.4  0.0 - 0.5 K/uL Final   Basophils Relative 10/12/2022 1  % Final   Basophils Absolute 10/12/2022 0.1  0.0 - 0.1 K/uL Final   Immature Granulocytes 10/12/2022 0  % Final   Abs Immature Granulocytes 10/12/2022 0.01  0.00 - 0.07 K/uL Final   Performed at [facility], [address] [place], [place] [address]   Sodium 10/12/2022 143  135 - 145 mmol/L Final   Potassium 10/12/2022 3.6  3.5 - 5.1 mmol/L Final   Chloride 10/12/2022 110  98 - 111 mmol/L Final   CO2 10/12/2022 25  22 - 32 mmol/L Final   Glucose, Bld 10/12/2022 88  70 - 99 mg/dL Final   Glucose reference range applies only to samples taken after fasting for at least 8 hours.   BUN 10/12/2022 18  8 - 23 mg/dL Final   Creatinine, Ser 10/12/2022 0.92  0.44 - 1.00 mg/dL Final   Calcium 10/12/2022 9.0  8.9 - 10.3 mg/dL Final   Total Protein 10/12/2022 7.4  6.5 - 8.1 g/dL Final   Albumin 10/12/2022 3.7  3.5 - 5.0 g/dL Final   AST 10/12/2022 36  15 - 41 U/L Final   ALT 10/12/2022 25  0 - 44 U/L Final   Alkaline Phosphatase 10/12/2022 181 (H)  38 - 126 U/L Final   Total Bilirubin 10/12/2022 0.6  0.3 - 1.2 mg/dL Final   GFR, Estimated 10/12/2022 >60  >60 mL/min Final   Comment: (NOTE) Calculated using the CKD-EPI Creatinine Equation (2021)    Anion gap 10/12/2022 8  5 - 15 Final   Performed at [facility],  [place], [place] [address]   Color, Urine 10/12/2022 YELLOW (A)  YELLOW Final   APPearance 10/12/2022 HAZY (A)  CLEAR Final   Specific Gravity, Urine 10/12/2022 1.025  1.005 - 1.030 Final   pH 10/12/2022 5.0  5.0 - 8.0 Final   Glucose, UA 10/12/2022 NEGATIVE  NEGATIVE mg/dL Final   Hgb urine dipstick 10/12/2022  NEGATIVE  NEGATIVE Final   Bilirubin Urine 10/12/2022 NEGATIVE  NEGATIVE Final   Ketones, ur 10/12/2022 NEGATIVE  NEGATIVE mg/dL Final   Protein, ur 10/12/2022 NEGATIVE  NEGATIVE mg/dL Final   Nitrite 10/12/2022 NEGATIVE  NEGATIVE Final   Leukocytes,Ua 10/12/2022 SMALL (A)  NEGATIVE Final   RBC / HPF 10/12/2022 0-5  0 - 5 RBC/hpf Final   WBC, UA 10/12/2022 11-20  0 - 5 WBC/hpf Final   Bacteria, UA 10/12/2022 NONE SEEN  NONE SEEN Final   Squamous Epithelial / LPF 10/12/2022 0-5  0 - 5 Final   Mucus 10/12/2022 PRESENT   Final   Performed at [facility], [address] [place], [place] [address]   ABO/RH(D) 10/12/2022 O NEG   Final   Antibody Screen 10/12/2022 NEG   Final   Sample Expiration 10/12/2022 10/26/2022,2359   Final   Extend sample reason 10/12/2022    Final                   Value:NO TRANSFUSIONS OR PREGNANCY IN THE PAST 3 MONTHS Performed at [facility], [address] [place], [place] [address]    MRSA, PCR 10/12/2022 NEGATIVE  NEGATIVE Final   Staphylococcus aureus 10/12/2022 NEGATIVE  NEGATIVE Final   Comment: (NOTE) The Xpert SA Assay (FDA approved for NASAL specimens in patients 33 years of age and older), is one component of a comprehensive surveillance program. It is not intended to diagnose infection nor to guide or monitor treatment. Performed at [facility], [address] [place], [place] [address]     ECG: Date: 10/12/2022 Time ECG obtained: 1214 PM Rate: 72 bpm Rhythm: atrial fibrillation Axis (leads I and aVF): Normal Intervals: QRS 104 ms. QTc 464 ms. ST segment and T wave changes: No  evidence of acute ST segment elevation or depression Comparison: Similar to previous tracing obtained on 10/12/2021   IMAGING / PROCEDURES: DIAGNOSTIC RADIOGRAPHS OF RIGHT KNEE 3 VIEWS performed on 03/27/2022 Severe tricompartmental degenerative changes, especially involving the lateral compartment with 100% joint space loss and some tibial erosion.   There also appears to be 100% loss of the patellofemoral compartment space with lateral subluxation of the patella, as well as 90% loss of the medial compartment clear space.   No lytic lesions or fractures are identified.  TRANSTHORACIC ECHOCARDIOGRAM performed on 01/27/2022 Normal left ventricular systolic function with an EF of >55% Mild LVH Normal left ventricular diastolic Doppler parameters Normal right ventricular systolic function Aortic valve sclerosis Trivial mitral and tricuspid valve regurgitation Normal transvalvular gradients; no valvular stenosis No pericardial effusion  Impression and Plan:  [name] has been referred for pre-anesthesia review and clearance prior to her undergoing the planned anesthetic and procedural courses. Available labs, pertinent testing, and imaging results were personally reviewed by me. This patient has been appropriately cleared by cardiology with an overall LOW risk of significant perioperative cardiovascular complications.  Based on clinical review performed today (10/18/22), barring any significant acute changes in the patient's overall condition, it is anticipated that she will be able to proceed with the planned surgical intervention. Any acute changes in clinical condition may necessitate her procedure being postponed and/or cancelled. Patient will meet with anesthesia team (MD and/or CRNA) on the day of her procedure for preoperative evaluation/assessment. Questions regarding anesthetic course will be fielded at that time.   Pre-surgical instructions were reviewed with the patient during  her PAT appointment and questions were fielded by PAT clinical staff. Patient was  advised that if any questions or concerns arise prior to her procedure then she should return a call to PAT and/or her surgeon's office to discuss.  Honor Loh, MSN, APRN, FNP-C, CEN [facility]  Peri-operative Services Nurse Practitioner Phone: [phone] Fax: [phone] 10/18/22 12:56 PM  NOTE: This note has been prepared using Dragon dictation software. Despite my best ability to proofread, there is always the potential that unintentional transcriptional errors may still occur from this process.

## 2022-10-23 MED ORDER — CEFAZOLIN SODIUM-DEXTROSE 2-4 GM/100ML-% IV SOLN
2.0000 g | INTRAVENOUS | Status: AC
  Administered 2022-10-24: 2 g via INTRAVENOUS

## 2022-10-23 MED ORDER — LACTATED RINGERS IV SOLN: INTRAVENOUS | Status: DC

## 2022-10-23 MED ORDER — ORAL CARE MOUTH RINSE: 15.0000 mL | Freq: Once | OROMUCOSAL | Status: AC

## 2022-10-23 MED ORDER — CHLORHEXIDINE GLUCONATE 0.12 % MT SOLN: 15.0000 mL | Freq: Once | OROMUCOSAL | Status: AC

## 2022-10-23 MED ORDER — FAMOTIDINE 20 MG PO TABS: 20.0000 mg | ORAL_TABLET | Freq: Once | ORAL | Status: AC

## 2022-10-24 ENCOUNTER — Other Ambulatory Visit: Payer: Self-pay

## 2022-10-24 ENCOUNTER — Ambulatory Visit: Payer: Medicare Other | Admitting: Urgent Care

## 2022-10-24 ENCOUNTER — Inpatient Hospital Stay: Payer: Medicare Other

## 2022-10-24 ENCOUNTER — Encounter
Admission: AD | Disposition: A | Discharge status: Skilled Nursing Facility | Payer: Self-pay | Source: Home / Self Care | Attending: Surgery

## 2022-10-24 ENCOUNTER — Encounter: Payer: Self-pay | Admitting: Surgery

## 2022-10-24 ENCOUNTER — Observation Stay
Admission: AD | Admit: 2022-10-24 | Discharge: 2022-10-27 | Disposition: A | Discharge status: Skilled Nursing Facility | Payer: Medicare Other | Attending: Surgery | Admitting: Surgery

## 2022-10-24 DIAGNOSIS — J45909 Unspecified asthma, uncomplicated: Secondary | ICD-10-CM | POA: Diagnosis not present

## 2022-10-24 DIAGNOSIS — M1711 Unilateral primary osteoarthritis, right knee: Principal | ICD-10-CM | POA: Insufficient documentation

## 2022-10-24 DIAGNOSIS — Z79899 Other long term (current) drug therapy: Secondary | ICD-10-CM | POA: Insufficient documentation

## 2022-10-24 DIAGNOSIS — Z96652 Presence of left artificial knee joint: Secondary | ICD-10-CM | POA: Insufficient documentation

## 2022-10-24 DIAGNOSIS — I480 Paroxysmal atrial fibrillation: Secondary | ICD-10-CM | POA: Insufficient documentation

## 2022-10-24 DIAGNOSIS — N184 Chronic kidney disease, stage 4 (severe): Secondary | ICD-10-CM | POA: Diagnosis not present

## 2022-10-24 DIAGNOSIS — I129 Hypertensive chronic kidney disease with stage 1 through stage 4 chronic kidney disease, or unspecified chronic kidney disease: Secondary | ICD-10-CM | POA: Diagnosis not present

## 2022-10-24 DIAGNOSIS — Z96651 Presence of right artificial knee joint: Principal | ICD-10-CM

## 2022-10-24 DIAGNOSIS — J449 Chronic obstructive pulmonary disease, unspecified: Secondary | ICD-10-CM | POA: Diagnosis not present

## 2022-10-24 DIAGNOSIS — Z7901 Long term (current) use of anticoagulants: Secondary | ICD-10-CM | POA: Diagnosis not present

## 2022-10-24 HISTORY — DX: Nontoxic multinodular goiter: E04.2

## 2022-10-24 HISTORY — PX: TOTAL KNEE ARTHROPLASTY: SHX125

## 2022-10-24 SURGERY — ARTHROPLASTY, KNEE, TOTAL
Anesthesia: General | Site: Knee | Laterality: Right

## 2022-10-24 MED ORDER — DEXAMETHASONE SODIUM PHOSPHATE 10 MG/ML IJ SOLN
INTRAMUSCULAR | Status: DC | PRN
  Administered 2022-10-24: 10 mg via INTRAVENOUS

## 2022-10-24 MED ORDER — ONDANSETRON HCL 4 MG/2ML IJ SOLN: 4.0000 mg | Freq: Once | INTRAMUSCULAR | Status: DC | PRN

## 2022-10-24 MED ORDER — FENTANYL CITRATE (PF) 100 MCG/2ML IJ SOLN
INTRAMUSCULAR | Status: AC
  Filled 2022-10-24: qty 2

## 2022-10-24 MED ORDER — OXYCODONE HCL 5 MG PO TABS
ORAL_TABLET | ORAL | Status: AC
  Administered 2022-10-24: 5 mg via ORAL
  Filled 2022-10-24: qty 1

## 2022-10-24 MED ORDER — SODIUM CHLORIDE FLUSH 0.9 % IV SOLN
INTRAVENOUS | Status: AC
  Filled 2022-10-24: qty 40

## 2022-10-24 MED ORDER — KETAMINE HCL 10 MG/ML IJ SOLN
INTRAMUSCULAR | Status: DC | PRN
  Administered 2022-10-24: 30 mg via INTRAVENOUS
  Administered 2022-10-24: 10 mg via INTRAVENOUS

## 2022-10-24 MED ORDER — METHOTREXATE SODIUM 2.5 MG PO TABS: 2.5000 mg | ORAL_TABLET | ORAL | Status: DC

## 2022-10-24 MED ORDER — SODIUM CHLORIDE (PF) 0.9 % IJ SOLN
INTRAMUSCULAR | Status: DC | PRN
  Administered 2022-10-24: 90 mL via SURGICAL_CAVITY

## 2022-10-24 MED ORDER — FENTANYL CITRATE (PF) 100 MCG/2ML IJ SOLN: 25.0000 ug | INTRAMUSCULAR | Status: DC | PRN

## 2022-10-24 MED ORDER — ONDANSETRON HCL 4 MG PO TABS: 4.0000 mg | ORAL_TABLET | Freq: Four times a day (QID) | ORAL | Status: DC | PRN

## 2022-10-24 MED ORDER — TRIAMCINOLONE ACETONIDE 40 MG/ML IJ SUSP
INTRAMUSCULAR | Status: AC
  Filled 2022-10-24: qty 2

## 2022-10-24 MED ORDER — PROPOFOL 10 MG/ML IV BOLUS
INTRAVENOUS | Status: DC | PRN
  Administered 2022-10-24: 170 mg via INTRAVENOUS

## 2022-10-24 MED ORDER — ACETAMINOPHEN 325 MG PO TABS
325.0000 mg | ORAL_TABLET | Freq: Four times a day (QID) | ORAL | Status: DC | PRN
  Administered 2022-10-27 (×2): 325 mg via ORAL
  Filled 2022-10-24 (×2): qty 1

## 2022-10-24 MED ORDER — FOLIC ACID 1 MG PO TABS
1.0000 mg | ORAL_TABLET | Freq: Every day | ORAL | Status: DC
  Administered 2022-10-24 – 2022-10-27 (×4): 1 mg via ORAL
  Filled 2022-10-24 (×4): qty 1

## 2022-10-24 MED ORDER — TRANEXAMIC ACID 1000 MG/10ML IV SOLN
INTRAVENOUS | Status: AC
  Filled 2022-10-24: qty 10

## 2022-10-24 MED ORDER — MAGNESIUM HYDROXIDE 400 MG/5ML PO SUSP
30.0000 mL | Freq: Every day | ORAL | Status: DC | PRN
  Administered 2022-10-25 (×2): 30 mL via ORAL
  Filled 2022-10-24 (×3): qty 30

## 2022-10-24 MED ORDER — FENTANYL CITRATE (PF) 100 MCG/2ML IJ SOLN
INTRAMUSCULAR | Status: DC | PRN
  Administered 2022-10-24 (×4): 50 ug via INTRAVENOUS

## 2022-10-24 MED ORDER — CHLORHEXIDINE GLUCONATE 0.12 % MT SOLN
OROMUCOSAL | Status: AC
  Administered 2022-10-24: 15 mL via OROMUCOSAL
  Filled 2022-10-24: qty 15

## 2022-10-24 MED ORDER — LORATADINE 10 MG PO TABS
10.0000 mg | ORAL_TABLET | Freq: Every day | ORAL | Status: DC
  Administered 2022-10-24 – 2022-10-27 (×4): 10 mg via ORAL
  Filled 2022-10-24 (×4): qty 1

## 2022-10-24 MED ORDER — HYDROMORPHONE HCL 1 MG/ML IJ SOLN
INTRAMUSCULAR | Status: DC | PRN
  Administered 2022-10-24: 1 mg via INTRAVENOUS

## 2022-10-24 MED ORDER — APIXABAN 5 MG PO TABS
5.0000 mg | ORAL_TABLET | Freq: Two times a day (BID) | ORAL | Status: DC
  Administered 2022-10-25 – 2022-10-27 (×5): 5 mg via ORAL
  Filled 2022-10-24 (×5): qty 1

## 2022-10-24 MED ORDER — METOCLOPRAMIDE HCL 5 MG/ML IJ SOLN: 5.0000 mg | Freq: Three times a day (TID) | INTRAMUSCULAR | Status: DC | PRN

## 2022-10-24 MED ORDER — ROCURONIUM BROMIDE 100 MG/10ML IV SOLN
INTRAVENOUS | Status: DC | PRN
  Administered 2022-10-24: 50 mg via INTRAVENOUS

## 2022-10-24 MED ORDER — SUGAMMADEX SODIUM 200 MG/2ML IV SOLN
INTRAVENOUS | Status: DC | PRN
  Administered 2022-10-24: 200 mg via INTRAVENOUS

## 2022-10-24 MED ORDER — KETOROLAC TROMETHAMINE 15 MG/ML IJ SOLN: 15.0000 mg | Freq: Once | INTRAMUSCULAR | Status: AC

## 2022-10-24 MED ORDER — BUPIVACAINE LIPOSOME 1.3 % IJ SUSP
INTRAMUSCULAR | Status: AC
  Filled 2022-10-24: qty 20

## 2022-10-24 MED ORDER — KETAMINE HCL 50 MG/5ML IJ SOSY
PREFILLED_SYRINGE | INTRAMUSCULAR | Status: AC
  Filled 2022-10-24: qty 5

## 2022-10-24 MED ORDER — OXYCODONE HCL 5 MG PO TABS
5.0000 mg | ORAL_TABLET | ORAL | Status: DC | PRN
  Administered 2022-10-25 – 2022-10-26 (×5): 10 mg via ORAL
  Administered 2022-10-27 (×3): 5 mg via ORAL
  Filled 2022-10-24 (×2): qty 1
  Filled 2022-10-24: qty 2
  Filled 2022-10-24: qty 1
  Filled 2022-10-24 (×4): qty 2

## 2022-10-24 MED ORDER — MOMETASONE FUROATE 0.1 % EX OINT: TOPICAL_OINTMENT | Freq: Every day | CUTANEOUS | Status: DC

## 2022-10-24 MED ORDER — TRIAMCINOLONE ACETONIDE 40 MG/ML IJ SUSP
INTRAMUSCULAR | Status: DC | PRN
  Administered 2022-10-24: 3 mL via SURGICAL_CAVITY

## 2022-10-24 MED ORDER — FAMOTIDINE 20 MG PO TABS
ORAL_TABLET | ORAL | Status: AC
  Administered 2022-10-24: 20 mg via ORAL
  Filled 2022-10-24: qty 1

## 2022-10-24 MED ORDER — DOCUSATE SODIUM 100 MG PO CAPS
100.0000 mg | ORAL_CAPSULE | Freq: Two times a day (BID) | ORAL | Status: DC
  Administered 2022-10-24 – 2022-10-27 (×6): 100 mg via ORAL
  Filled 2022-10-24 (×6): qty 1

## 2022-10-24 MED ORDER — CEFAZOLIN SODIUM-DEXTROSE 2-4 GM/100ML-% IV SOLN
2.0000 g | Freq: Four times a day (QID) | INTRAVENOUS | Status: AC
  Administered 2022-10-24 – 2022-10-25 (×2): 2 g via INTRAVENOUS
  Filled 2022-10-24 (×3): qty 100

## 2022-10-24 MED ORDER — PROPOFOL 1000 MG/100ML IV EMUL
INTRAVENOUS | Status: AC
  Filled 2022-10-24: qty 100

## 2022-10-24 MED ORDER — ACETAMINOPHEN 500 MG PO TABS
1000.0000 mg | ORAL_TABLET | Freq: Four times a day (QID) | ORAL | Status: AC
  Administered 2022-10-24 – 2022-10-25 (×4): 1000 mg via ORAL
  Filled 2022-10-24 (×4): qty 2

## 2022-10-24 MED ORDER — ATORVASTATIN CALCIUM 10 MG PO TABS
5.0000 mg | ORAL_TABLET | Freq: Every day | ORAL | Status: DC
  Administered 2022-10-24 – 2022-10-26 (×3): 5 mg via ORAL
  Filled 2022-10-24 (×3): qty 1

## 2022-10-24 MED ORDER — PAROXETINE HCL 30 MG PO TABS
30.0000 mg | ORAL_TABLET | Freq: Every day | ORAL | Status: DC
  Administered 2022-10-25 – 2022-10-27 (×3): 30 mg via ORAL
  Filled 2022-10-24 (×3): qty 1

## 2022-10-24 MED ORDER — ACETAMINOPHEN 10 MG/ML IV SOLN
INTRAVENOUS | Status: DC | PRN
  Administered 2022-10-24: 1000 mg via INTRAVENOUS

## 2022-10-24 MED ORDER — LIDOCAINE HCL (CARDIAC) PF 100 MG/5ML IV SOSY
PREFILLED_SYRINGE | INTRAVENOUS | Status: DC | PRN
  Administered 2022-10-24: 50 mg via INTRAVENOUS

## 2022-10-24 MED ORDER — MIDAZOLAM HCL 2 MG/2ML IJ SOLN
INTRAMUSCULAR | Status: DC | PRN
  Administered 2022-10-24: 2 mg via INTRAVENOUS

## 2022-10-24 MED ORDER — DIPHENHYDRAMINE HCL 12.5 MG/5ML PO ELIX: 12.5000 mg | ORAL_SOLUTION | ORAL | Status: DC | PRN

## 2022-10-24 MED ORDER — SODIUM CHLORIDE 0.9 % IV SOLN: INTRAVENOUS | Status: DC

## 2022-10-24 MED ORDER — ACETAMINOPHEN 10 MG/ML IV SOLN
INTRAVENOUS | Status: AC
  Filled 2022-10-24: qty 100

## 2022-10-24 MED ORDER — DEXMEDETOMIDINE HCL IN NACL 200 MCG/50ML IV SOLN
INTRAVENOUS | Status: DC | PRN
  Administered 2022-10-24: 12 ug via INTRAVENOUS
  Administered 2022-10-24: 8 ug via INTRAVENOUS

## 2022-10-24 MED ORDER — KETOROLAC TROMETHAMINE 15 MG/ML IJ SOLN
7.5000 mg | Freq: Four times a day (QID) | INTRAMUSCULAR | Status: AC
  Administered 2022-10-24 – 2022-10-25 (×4): 7.5 mg via INTRAVENOUS
  Filled 2022-10-24 (×4): qty 1

## 2022-10-24 MED ORDER — HYDROXYCHLOROQUINE SULFATE 200 MG PO TABS
200.0000 mg | ORAL_TABLET | Freq: Two times a day (BID) | ORAL | Status: DC
  Administered 2022-10-25 – 2022-10-27 (×5): 200 mg via ORAL
  Filled 2022-10-24 (×5): qty 1

## 2022-10-24 MED ORDER — ALBUTEROL SULFATE (2.5 MG/3ML) 0.083% IN NEBU: 3.0000 mL | INHALATION_SOLUTION | Freq: Four times a day (QID) | RESPIRATORY_TRACT | Status: DC | PRN

## 2022-10-24 MED ORDER — KETOROLAC TROMETHAMINE 15 MG/ML IJ SOLN
INTRAMUSCULAR | Status: AC
  Administered 2022-10-24: 15 mg via INTRAVENOUS
  Filled 2022-10-24: qty 1

## 2022-10-24 MED ORDER — MOMETASONE FURO-FORMOTEROL FUM 100-5 MCG/ACT IN AERO
2.0000 | INHALATION_SPRAY | Freq: Two times a day (BID) | RESPIRATORY_TRACT | Status: DC
  Administered 2022-10-24 – 2022-10-27 (×5): 2 via RESPIRATORY_TRACT
  Filled 2022-10-24: qty 8.8

## 2022-10-24 MED ORDER — BUPIVACAINE-EPINEPHRINE (PF) 0.5% -1:200000 IJ SOLN
INTRAMUSCULAR | Status: AC
  Filled 2022-10-24: qty 30

## 2022-10-24 MED ORDER — FLEET ENEMA 7-19 GM/118ML RE ENEM: 1.0000 | ENEMA | Freq: Once | RECTAL | Status: DC | PRN

## 2022-10-24 MED ORDER — SODIUM CHLORIDE 0.9 % IR SOLN
Status: DC | PRN
  Administered 2022-10-24: 3000 mL

## 2022-10-24 MED ORDER — POTASSIUM CHLORIDE CRYS ER 10 MEQ PO TBCR
10.0000 meq | EXTENDED_RELEASE_TABLET | Freq: Every day | ORAL | Status: DC
  Administered 2022-10-25 – 2022-10-27 (×3): 10 meq via ORAL
  Filled 2022-10-24 (×3): qty 1

## 2022-10-24 MED ORDER — HYDROMORPHONE HCL 1 MG/ML IJ SOLN
INTRAMUSCULAR | Status: AC
  Filled 2022-10-24: qty 1

## 2022-10-24 MED ORDER — HYDROXYZINE HCL 25 MG PO TABS
25.0000 mg | ORAL_TABLET | Freq: Every day | ORAL | Status: DC
  Administered 2022-10-24 – 2022-10-26 (×3): 25 mg via ORAL
  Filled 2022-10-24 (×4): qty 1

## 2022-10-24 MED ORDER — METHOTREXATE SODIUM 2.5 MG PO TABS
15.0000 mg | ORAL_TABLET | ORAL | Status: DC
  Administered 2022-10-26: 15 mg via ORAL
  Filled 2022-10-24: qty 6

## 2022-10-24 MED ORDER — TRANEXAMIC ACID 1000 MG/10ML IV SOLN
INTRAVENOUS | Status: DC | PRN
  Administered 2022-10-24: 1000 mg via TOPICAL

## 2022-10-24 MED ORDER — 0.9 % SODIUM CHLORIDE (POUR BTL) OPTIME
TOPICAL | Status: DC | PRN
  Administered 2022-10-24: 500 mL

## 2022-10-24 MED ORDER — ESMOLOL HCL 100 MG/10ML IV SOLN
INTRAVENOUS | Status: DC | PRN
  Administered 2022-10-24: 10 ug via INTRAVENOUS

## 2022-10-24 MED ORDER — FERROUS SULFATE 325 (65 FE) MG PO TABS
325.0000 mg | ORAL_TABLET | Freq: Every day | ORAL | Status: DC
  Administered 2022-10-25 – 2022-10-27 (×3): 325 mg via ORAL
  Filled 2022-10-24 (×3): qty 1

## 2022-10-24 MED ORDER — LABETALOL HCL 5 MG/ML IV SOLN
INTRAVENOUS | Status: DC | PRN
  Administered 2022-10-24: 5 mg via INTRAVENOUS

## 2022-10-24 MED ORDER — CEFAZOLIN SODIUM-DEXTROSE 2-4 GM/100ML-% IV SOLN
INTRAVENOUS | Status: AC
  Administered 2022-10-24: 2 g via INTRAVENOUS
  Filled 2022-10-24: qty 100

## 2022-10-24 MED ORDER — BISACODYL 10 MG RE SUPP
10.0000 mg | Freq: Every day | RECTAL | Status: DC | PRN
  Administered 2022-10-26: 10 mg via RECTAL
  Filled 2022-10-24: qty 1

## 2022-10-24 MED ORDER — ONDANSETRON HCL 4 MG/2ML IJ SOLN: 4.0000 mg | Freq: Four times a day (QID) | INTRAMUSCULAR | Status: DC | PRN

## 2022-10-24 MED ORDER — MIDAZOLAM HCL 2 MG/2ML IJ SOLN
INTRAMUSCULAR | Status: AC
  Filled 2022-10-24: qty 2

## 2022-10-24 MED ORDER — METOCLOPRAMIDE HCL 5 MG PO TABS: 5.0000 mg | ORAL_TABLET | Freq: Three times a day (TID) | ORAL | Status: DC | PRN

## 2022-10-24 MED ORDER — AMLODIPINE BESYLATE 10 MG PO TABS
10.0000 mg | ORAL_TABLET | Freq: Every day | ORAL | Status: DC
  Administered 2022-10-25 – 2022-10-27 (×3): 10 mg via ORAL
  Filled 2022-10-24 (×3): qty 1

## 2022-10-24 SURGICAL SUPPLY — 64 items
APL PRP STRL LF DISP 70% ISPRP (MISCELLANEOUS) ×1
BIT DRILL QUICK REL 1/8 2PK SL (DRILL) IMPLANT
BLADE SAW SAG 25X90X1.19 (BLADE) ×1 IMPLANT
BLADE SURG SZ20 CARB STEEL (BLADE) ×1 IMPLANT
BNDG CMPR STD VLCR NS LF 5.8X6 (GAUZE/BANDAGES/DRESSINGS) ×1
BNDG ELASTIC 6X5.8 VLCR NS LF (GAUZE/BANDAGES/DRESSINGS) ×1 IMPLANT
BRNG TIB 71X10 ANT STAB MDLR (Insert) ×1 IMPLANT
CANISTER PREVENA 45 (CANNISTER) IMPLANT
CEMENT BONE R 1X40 (Cement) ×2 IMPLANT
CEMENT VACUUM MIXING SYSTEM (MISCELLANEOUS) ×1 IMPLANT
CHLORAPREP W/TINT 26 (MISCELLANEOUS) ×1 IMPLANT
COMPONENT PATELLAR VGD 7.8X3 (Joint) IMPLANT
COOLER POLAR GLACIER W/PUMP (MISCELLANEOUS) ×1 IMPLANT
COVER MAYO STAND REUSABLE (DRAPES) ×1 IMPLANT
CUFF TOURN SGL QUICK 24 (TOURNIQUET CUFF)
CUFF TOURN SGL QUICK 34 (TOURNIQUET CUFF)
CUFF TRNQT CYL 24X4X16.5-23 (TOURNIQUET CUFF) IMPLANT
CUFF TRNQT CYL 34X4.125X (TOURNIQUET CUFF) IMPLANT
DRAPE 3/4 80X56 (DRAPES) ×1 IMPLANT
DRAPE IMP U-DRAPE 54X76 (DRAPES) ×1 IMPLANT
DRAPE U-SHAPE 47X51 STRL (DRAPES) ×1 IMPLANT
DRILL QUICK RELEASE 1/8 INCH (DRILL) ×3
DRSG MEPILEX SACRM 8.7X9.8 (GAUZE/BANDAGES/DRESSINGS) IMPLANT
DRSG OPSITE POSTOP 4X10 (GAUZE/BANDAGES/DRESSINGS) ×1 IMPLANT
DRSG OPSITE POSTOP 4X8 (GAUZE/BANDAGES/DRESSINGS) ×1 IMPLANT
ELECT REM PT RETURN 9FT ADLT (ELECTROSURGICAL) ×1
ELECTRODE REM PT RTRN 9FT ADLT (ELECTROSURGICAL) ×1 IMPLANT
FEMORAL CR RIGHT 67.5MM (Joint) IMPLANT
GAUZE XEROFORM 1X8 LF (GAUZE/BANDAGES/DRESSINGS) ×1 IMPLANT
GLOVE BIO SURGEON STRL SZ7.5 (GLOVE) ×4 IMPLANT
GLOVE BIO SURGEON STRL SZ8 (GLOVE) ×4 IMPLANT
GLOVE BIOGEL PI IND STRL 8 (GLOVE) ×1 IMPLANT
GLOVE SURG UNDER LTX SZ8 (GLOVE) ×1 IMPLANT
GOWN STRL REUS W/ TWL LRG LVL3 (GOWN DISPOSABLE) ×1 IMPLANT
GOWN STRL REUS W/ TWL XL LVL3 (GOWN DISPOSABLE) ×1 IMPLANT
GOWN STRL REUS W/TWL LRG LVL3 (GOWN DISPOSABLE) ×1
GOWN STRL REUS W/TWL XL LVL3 (GOWN DISPOSABLE) ×1
HOOD PEEL AWAY T7 (MISCELLANEOUS) ×3 IMPLANT
INSERT TIB BEARING 71 10 (Insert) IMPLANT
IV NS IRRIG 3000ML ARTHROMATIC (IV SOLUTION) ×1 IMPLANT
KIT PREVENA INCISION MGT20CM45 (CANNISTER) IMPLANT
KIT TURNOVER KIT A (KITS) ×1 IMPLANT
MANIFOLD NEPTUNE II (INSTRUMENTS) ×1 IMPLANT
NDL SPNL 20GX3.5 QUINCKE YW (NEEDLE) ×1 IMPLANT
NEEDLE SPNL 20GX3.5 QUINCKE YW (NEEDLE) ×1 IMPLANT
NS IRRIG 1000ML POUR BTL (IV SOLUTION) ×1 IMPLANT
PACK TOTAL KNEE (MISCELLANEOUS) ×1 IMPLANT
PAD WRAPON POLAR KNEE (MISCELLANEOUS) ×1 IMPLANT
PENCIL SMOKE EVACUATOR (MISCELLANEOUS) ×1 IMPLANT
PLATE KNEE TIBIAL 71MM FIXED (Plate) IMPLANT
PULSAVAC PLUS IRRIG FAN TIP (DISPOSABLE) ×1
STAPLER SKIN PROX 35W (STAPLE) ×1 IMPLANT
SUCTION FRAZIER HANDLE 10FR (MISCELLANEOUS) ×1
SUCTION TUBE FRAZIER 10FR DISP (MISCELLANEOUS) ×1 IMPLANT
SUT VIC AB 0 CT1 36 (SUTURE) ×3 IMPLANT
SUT VIC AB 2-0 CT1 27 (SUTURE) ×5
SUT VIC AB 2-0 CT1 TAPERPNT 27 (SUTURE) ×3 IMPLANT
SYR 10ML LL (SYRINGE) ×1 IMPLANT
SYR 20ML LL LF (SYRINGE) ×1 IMPLANT
SYR 30ML LL (SYRINGE) IMPLANT
TIP FAN IRRIG PULSAVAC PLUS (DISPOSABLE) ×1 IMPLANT
TRAP FLUID SMOKE EVACUATOR (MISCELLANEOUS) ×2 IMPLANT
WATER STERILE IRR 500ML POUR (IV SOLUTION) ×1 IMPLANT
WRAPON POLAR PAD KNEE (MISCELLANEOUS) ×1

## 2022-10-24 NOTE — Op Note (Signed)
10/24/2022  9:56 AM  Patient:   [name]  Pre-Op Diagnosis:   Degenerative joint disease, right knee.  Post-Op Diagnosis:   Same  Procedure:   Right TKA using all-cemented Bi[employer] Vanguard system with a 67.5 mm mm PCR femur, a 71 mm tibial tray with a 10 mm anterior stabilized E-poly insert, and a 34 x 7.8 mm all-poly 3-pegged domed patella.  Surgeon:   Pascal Lux, MD  Assistant:   Cameron Proud, PA-C; Reymundo Poll, PA-S  Anesthesia:   GET  Findings:   As above  Complications:   None  EBL:   25 cc  Fluids:   800 cc crystalloid  UOP:   None  TT:   100 minutes at 300 mmHg  Drains:   None  Closure:   Staples  Implants:   As above  Brief Clinical Note:   The patient is a 71 year old female with a long history of progressively worsening right knee pain. The patient's symptoms have progressed despite medications, activity modification, injections, etc. The patient's history and examination were consistent with advanced degenerative joint disease of the right knee confirmed by plain radiographs. The patient presents at this time for a right total knee arthroplasty.  Procedure:   The patient was brought into the operating room. After adequate spinal anesthesia was obtained, the patient was lain in the supine position before the right lower extremity was prepped with ChloraPrep solution and draped sterilely. Preoperative antibiotics were administered. A timeout was performed to verify the appropriate surgical site before the limb was exsanguinated with an Esmarch and the tourniquet inflated to 300 mmHg.   A standard anterior approach to the knee was made through an approximately 7 inch incision. The incision was carried down through the subcutaneous tissues to expose superficial retinaculum. This was split the length of the incision and the medial flap elevated sufficiently to expose the medial retinaculum. The medial retinaculum was incised, leaving a 3-4 mm cuff of  tissue on the patella. This was extended distally along the medial border of the patellar tendon and proximally through the medial third of the quadriceps tendon. A subtotal fat pad excision was performed before the soft tissues were elevated off the anteromedial and anterolateral aspects of the proximal tibia to the level of the collateral ligaments. The anterior portions of the medial and lateral menisci were removed, as was the anterior cruciate ligament. With the knee flexed to 90, the external tibial guide was positioned and the appropriate proximal tibial cut made. This piece was taken to the back table where it was measured and found to be optimally replicated by a 71 mm component.  Attention was directed to the distal femur. The intramedullary canal was accessed through a 3/8" drill hole. The intramedullary guide was inserted and positioned in order to obtain a neutral flexion gap. The intercondylar block was positioned with care taken to avoid notching the anterior cortex of the femur. The appropriate cut was made. Next, the distal cutting block was placed at 6 of valgus alignment. Using the 9 mm slot, the distal cut was made. The distal femur was measured and found to be optimally replicated by the 56.3 mm component. The 67.5 mm 4-in-1 cutting block was positioned and first the posterior, then the posterior chamfer, the anterior chamfer, and finally the anterior cuts were made. At this point, the posterior portions medial and lateral menisci were removed. A trial reduction was performed using the appropriate femoral and tibial components with the 10  mm insert. This demonstrated excellent stability to varus and valgus stressing both in flexion and extension while permitting full extension. Patella tracking was assessed and found to be excellent. Therefore, the tibial guide position was marked on the proximal tibia. The patella thickness was measured and found to be 19 mm. Therefore, the appropriate cut  was made. The patellar surface was measured and found to be optimally replicated by the 34 mm component. The three peg holes were drilled in place before the trial button was inserted. Patella tracking was assessed and found to be excellent, passing the "no thumb test". The lug holes were drilled into the distal femur before the trial component was removed, leaving only the tibial tray. The keel was then created using the appropriate tower, reamer, and punch.  The bony surfaces were prepared for cementing by irrigating them thoroughly with sterile saline solution via the jet lavage system. A bone plug was fashioned from some of the bone that had been removed previously and used to plug the distal femoral canal. In addition, 20 cc of Exparel diluted out to 60 cc with normal saline and 30 cc of 0.5% Sensorcaine were injected into the postero-medial and postero-lateral aspects of the knee, the medial and lateral gutter regions, and the peri-incisional tissues to help with postoperative analgesia. Meanwhile, the cement was being mixed on the back table. When it was ready, the tibial tray was cemented in first. The excess cement was removed using [employer] fast streamer. Next, the femoral component was impacted into place. Again, the excess cement was removed using [employer] fast streamer. The 10 mm trial insert was positioned and the knee brought into extension while the cement hardened. Finally, the patella was cemented into place and secured using the patellar clamp. Again, the excess cement was removed using [employer] fast streamer. Once the cement had hardened, the knee was placed through a range of motion with the findings as described above. Therefore, the trial insert was removed and, after verifying that no cement had been retained posteriorly, the permanent 10 mm anterior stabilized E-polyethylene insert was positioned and secured using the appropriate key locking mechanism. Again the knee was placed through a range of motion with  the findings as described above.  The wound was copiously irrigated with sterile saline solution using the jet lavage system before the quadriceps tendon and retinacular layer were reapproximated using #0 Vicryl interrupted sutures. The superficial retinacular layer also was closed using a running #0 Vicryl suture. A total of 10 cc of transexemic acid (TXA) was injected intra-articularly before the subcutaneous tissues were closed in several layers using 2-0 Vicryl interrupted sutures. The skin was closed using staples. A sterile honeycomb dressing was applied to the skin before the leg was wrapped with an Ace wrap to accommodate the Polar Care device. The patient was then awakened and returned to the recovery room in satisfactory condition after tolerating the procedure well.

## 2022-10-24 NOTE — Anesthesia Procedure Notes (Signed)
Procedure Name: Intubation Date/Time: 10/24/2022 7:39 AM  Performed by: Biagio Borg, CRNAPre-anesthesia Checklist: Patient identified, Emergency Drugs available, Suction available and Patient being monitored Patient Re-evaluated:Patient Re-evaluated prior to induction Oxygen Delivery Method: Circle system utilized Preoxygenation: Pre-oxygenation with 100% oxygen Induction Type: IV induction Ventilation: Mask ventilation without difficulty Laryngoscope Size: McGraph and 3 Grade View: Grade I Tube type: Oral Tube size: 7.0 mm Number of attempts: 1 Airway Equipment and Method: Stylet Placement Confirmation: ETT inserted through vocal cords under direct vision, positive ETCO2 and breath sounds checked- equal and bilateral Secured at: 21 cm Tube secured with: Tape Dental Injury: Teeth and Oropharynx as per pre-operative assessment

## 2022-10-24 NOTE — Anesthesia Preprocedure Evaluation (Signed)
Anesthesia Evaluation  Patient identified by MRN, date of birth, ID band Patient awake    Reviewed: Allergy & Precautions, NPO status , Patient's Chart, lab work & pertinent test results  History of Anesthesia Complications Negative for: history of anesthetic complications  Airway Mallampati: III  TM Distance: >3 FB Neck ROM: Full    Dental  (+) Poor Dentition, Missing, Dental Advidsory Given,    Pulmonary neg shortness of breath, asthma , neg sleep apnea, COPD,  COPD inhaler, neg recent URI   Pulmonary exam normal breath sounds clear to auscultation       Cardiovascular Exercise Tolerance: Poor hypertension, Pt. on medications (-) angina (-) Past MI and (-) Cardiac Stents + dysrhythmias (incomplete RBBB) Atrial Fibrillation (-) Valvular Problems/Murmurs Rhythm:Irregular Rate:Normal - Systolic murmurs and - Peripheral Edema TTE performed on 09/15/2020 demonstrated normal left ventricular systolic function with an estimated EF of > 55%. There is no evidence of significant valvular regurgitation or transvalvular gradient suggestive of stenosis.    Neuro/Psych  PSYCHIATRIC DISORDERS Anxiety     negative neurological ROS     GI/Hepatic Neg liver ROS,GERD  Controlled,,  Endo/Other  negative endocrine ROS    Renal/GU CRFRenal disease  negative genitourinary   Musculoskeletal  (+) Arthritis , Osteoarthritis and Rheumatoid disorders,    Abdominal  (+) + obese  Peds negative pediatric ROS (+)  Hematology negative hematology ROS (+)   Anesthesia Other Findings Allergic rhinitis Anxiety Paroxysmal A-fib (HCC) Benign essential hypertension Chronic right shoulder pain GERD (gastroesophageal reflux disease) Pt held Apixaban from 10/28  Hyperlipidemia, mixed Primary osteoarthritis of both knees Degenerative arthritis of right knee Weakness Lymphedema    Reproductive/Obstetrics negative OB ROS                              Anesthesia Physical Anesthesia Plan  ASA: 3  Anesthesia Plan: General   Post-op Pain Management:    Induction: Intravenous  PONV Risk Score and Plan: 2 and Treatment may vary due to age or medical condition, Ondansetron and Dexamethasone  Airway Management Planned: Oral ETT and LMA  Additional Equipment:   Intra-op Plan:   Post-operative Plan: Extubation in OR  Informed Consent: I have reviewed the patients History and Physical, chart, labs and discussed the procedure including the risks, benefits and alternatives for the proposed anesthesia with the patient or authorized representative who has indicated his/her understanding and acceptance.     Dental advisory given  Plan Discussed with: CRNA and Anesthesiologist  Anesthesia Plan Comments:         Anesthesia Quick Evaluation

## 2022-10-24 NOTE — Transfer of Care (Signed)
Immediate Anesthesia Transfer of Care Note  Patient: [name]  Procedure(s) Performed: TOTAL KNEE ARTHROPLASTY - RNFA (Right: Knee)  Patient Location: PACU  Anesthesia Type:General  Level of Consciousness: awake and patient cooperative  Airway & Oxygen Therapy: Patient Spontanous Breathing and Patient connected to face mask oxygen  Post-op Assessment: Report given to RN and Post -op Vital signs reviewed and stable  Post vital signs: Reviewed and stable  Last Vitals:  Vitals Value Taken Time  BP    Temp    Pulse    Resp    SpO2      Last Pain:  Vitals:   10/24/22 0637  TempSrc: Temporal         Complications: No notable events documented.

## 2022-10-24 NOTE — H&P (Signed)
History of Present Illness:  [name] is a 71 y.o. female who presents for follow-up of her right knee pain secondary to advanced degenerative joint disease. The patient was last seen for these symptoms in April, 2023, and is scheduled for a right total knee arthroplasty on 10/24/2022. The patient continues to experience moderate to severe pain in her knee which she rates at 6/10 on today's visit. She has been taking Tylenol and applying heat as necessary with limited benefit. She notes that her symptoms are worse with any weightbearing activity. She ambulates with a rolling walker for balance and support. She is unable to reciprocate stairs. She has pain at night, occasionally awakening her from sleep. She denies any recent reinjury to the knee, and denies any numbness or paresthesias down her leg to her foot. She is quite frustrated by her symptoms and functional limitations, and is ready to consider more aggressive treatment options.  Current Outpatient Medications: acetaminophen (TYLENOL) 650 MG ER tablet Take 1 tablet (650 mg total) by mouth every 8 (eight) hours as needed for Pain  albuterol (PROAIR HFA) 90 mcg/actuation inhaler Inhale 2 inhalations into the lungs as needed for Wheezing 1 Inhaler 3  amLODIPine (NORVASC) 10 MG tablet TAKE 1 TABLET BY MOUTH EVERY DAY 90 tablet 2  atorvastatin (LIPITOR) 10 MG tablet Take 0.5 tablets (5 mg total) by mouth once daily 45 tablet 1  ELIQUIS 5 mg tablet TAKE 1 TABLET BY MOUTH TWICE A DAY 180 tablet 3  ferrous sulfate 325 (65 FE) MG tablet Take 1 tablet (325 mg total) by mouth daily with breakfast 950 tablet 1  folic acid (FOLVITE) 1 MG tablet Take 1 tablet (1 mg total) by mouth once daily 90 tablet 3  hydroxychloroquine (PLAQUENIL) 200 mg tablet Take 1 tablet (200 mg total) by mouth 2 (two) times daily 60 tablet 5  hydrOXYzine (ATARAX) 25 MG tablet TAKE 2 TABLETS NIGHTLY FOR ITCHING  loratadine (CLARITIN) 10 mg tablet Take 1 tablet (10 mg total) by  mouth once daily  methotrexate (RHEUMATREX) 2.5 MG tablet Take 6 tablets (15 mg total) by mouth every 7 (seven) days All on the same day 72 tablet 1  mometasone (ELOCON) 0.1 % ointment Apply topically once daily  PARoxetine (PAXIL) 30 MG tablet TAKE 1 TABLET BY MOUTH EVERY DAY 90 tablet 3  potassium chloride (KLOR-CON) 10 MEQ ER tablet TAKE 1 TABLET BY MOUTH ONCE DAILY 90 tablet 3  SYMBICORT 80-4.5 mcg/actuation inhaler INHALE 2 INHALATIONS INTO THE LUNGS 2 (TWO) TIMES DAILY (Patient taking differently: Inhale 2 inhalations into the lungs continuously as needed) 3 Inhaler 1  triamcinolone 0.025 % cream APPLY DAILY ON LEGS  triamcinolone 0.1 % cream 1(ONE) APPLICATION(S) ALL OVER BODY TOPICAL 2(TWO) TIMES A DAY UNTIL FOLLOW UP   Allergies:  Sulfa (Sulfonamide Antibiotics) Hives and Itching  Leflunomide Itching  Amoxicillin-Pot Clavulanate Nausea  Valdecoxib Rash   Past Medical History:  Allergic rhinitis  Anxiety  Cellulitis  Chronic obstructive asthma, unspecified  Dermatophytosis of nail  Essential hypertension, benign  GERD (gastroesophageal reflux disease)  History of MRSA infection  Osteoarthrosis, unspecified whether generalized or localized, lower leg   Past Surgical History:  CHOLECYSTECTOMY 07/31/2006 (with intraoperative cholangiogram)  Left TKA using all-cemented Biomet Vanguard system with a 67.5 mm mm PCR femur, a 71 mm tibial tray with a 12 mm anterior stabilized E-poly insert, and a 34 x 7.8 mm all-poly 3-pegged domed patella Left 10/18/2021 (Dr.Kharson Rasmusson)  CESAREAN SECTION  FOOT ARTHRODESIS, TRIPLE Left  MASTECTOMY  Family History:  High blood pressure (Hypertension) Mother  Diabetes Mother  Gout Mother  Arthritis Mother  High blood pressure (Hypertension) Father  Arthritis Father  Seizures Sister   Social History:   Socioeconomic History:  Marital status: Divorced  Occupational History  Occupation: Retired  Tobacco Use  Smoking status: Never  Smokeless  tobacco: Never  Surveyor, mining Use: Never used  Substance and Sexual Activity  Alcohol use: No  Alcohol/week: 0.0 standard drinks of alcohol  Drug use: Never  Sexual activity: Not Currently   Review of Systems:  A comprehensive 14 point ROS was performed, reviewed, and the pertinent orthopaedic findings are documented in the HPI.  Physical Exam: Vitals:  10/02/22 1435  BP: (!) 132/92  Weight: (!) 101 kg (222 lb 9.6 oz)  Height: 167.6 cm (5\' 6" )  PainSc: 6  PainLoc: Knee   General/Constitutional: The patient appears to be well-nourished, well-developed, and in no acute distress. Neuro/Psych: Normal mood and affect, oriented to person, place and time. Eyes: Non-icteric. Pupils are equal, round, and reactive to light, and exhibit synchronous movement. ENT: Unremarkable. Lymphatic: No palpable adenopathy. Respiratory: Lungs clear to auscultation, Normal chest excursion, No wheezes, and Non-labored breathing Cardiovascular: Irregularly irregular heart rate without murmurs, and No edema, swelling or tenderness, except as noted in detailed exam. Integumentary: No impressive skin lesions present, except as noted in detailed exam. Musculoskeletal: Unremarkable, except as noted in detailed exam.  Right knee exam: ALIGNMENT: Moderate valgus SKIN: Unremarkable SWELLING: Mild-moderate swelling EFFUSION: Small effusion WARMTH: None TENDERNESS: Mild-moderate tenderness along lateral more so than medial joint line ROM: 10-95 degrees McMURRAY'S: Equivocally positive PATELLOFEMORAL: Normal tracking with no peri-patellar tenderness and negative apprehension sign CREPITUS: Mild patellofemoral crepitance LACHMAN'S: Negative PIVOT SHIFT: Not evaluated ANTERIOR DRAWER: Negative POSTERIOR DRAWER: Negative VARUS/VALGUS: Mild pseudolaxity to varus and valgus stressing  She remains neurovascularly intact to the right lower extremity and foot.  X-rays/MRI/Lab data:  Recent standing AP and  lateral x-rays of the right knee, as well as a sunrise view, are available for review and have been reviewed by myself. These films demonstrate severe tricompartmental degenerative changes, especially involving the lateral compartment with 100% joint space loss and some tibial erosion. There also appears to be 100% loss of the patellofemoral compartment space with lateral subluxation of the patella, as well as 90% loss of the medial compartment clear space. No lytic lesions or fractures are identified.   Assessment: Primary osteoarthritis of right knee.   Plan: The treatment options were discussed with the patient. In addition, patient educational materials were provided regarding the diagnosis and treatment options. The patient is quite frustrated by her symptoms and function limitations, and is ready to consider more aggressive treatment options. Therefore, I have recommended a surgical procedure, specifically a right total knee arthroplasty. The procedure was discussed with the patient, as were the potential risks (including bleeding, infection, nerve and/or blood vessel injury, persistent or recurrent pain, loosening and/or failure of the components, dislocation, need for further surgery, blood clots, strokes, heart attacks and/or arhythmias, pneumonia, etc.) and benefits. The patient states his/her understanding and wishes to proceed. All of the patient's questions and concerns were answered. She can call any time with further concerns. She will follow up post-surgery, routine.    H&P reviewed and patient re-examined. No changes.

## 2022-10-24 NOTE — Evaluation (Signed)
Physical Therapy Evaluation Patient Details Name: [name] MRN: [id number] DOB: 10/07/1951 Today's Date: 10/24/2022  History of Present Illness  [name] is a 71 y.o. female who presents for follow-up of her right knee pain secondary to advanced degenerative joint disease. The patient was last seen for these symptoms in April, 2023, and is scheduled for a right total knee arthroplasty on 10/24/2022. Pt is now S/P Right TKA using all-cemented Biomet Vanguard system with a 67.5 mm mm PCR femur, a 71 mm tibial tray with a 10 mm anterior stabilized E-poly insert, and a 34 x 7.8 mm all-poly 3-pegged domed patella. WBAT to RLE  Clinical Impression  Pt received in bed with SCD and Polar ice on, with daughter by her side and agreeable to PT evaluation. PLOF is Independent with RW at household level and community level activity participation. Pt lethargic 2/2 to S/P TKA  but A and O x 4. Pt kept falling asleep bu awake with VC through out the session. Pt c/o nausea, dizziness and pain in RLE.  Dizziness worsened with OOB transfer. Pt provided with Barf bag. Pt SLR WFL in supine. Pt Sensation intact in BLE. Pt performed bed mobility with HOB elevated with Sup and set up assist. Pt STS with Mod assist with FWW. Pt stood for 20 secs while operforming wt shifting and marching on the spot. Pt performed stand Pivot transfer using FWW with Mod assist 2/2 to nausea, pain and increased Dizziness. Pt compliant to WBAT. AROM R knee 0 to 70 degrees. R Hip and R ankle AFL.  Polar ice and SCD connected with BLE elevated while in chair. Pt provided with TKA exercise H/O. Pt will benefit from reviewing it again when less lethargic nauseous.  Pt tol treatment will. PT will continue BID in acute care and pt will benefit from SNF ( pt prefers [facility] 2/2 to previous exp with L TKA a year ago.)after Acute care to return to PLOF safely.     Recommendations for follow up therapy are one component of a multi-disciplinary  discharge planning process, led by the attending physician.  Recommendations may be updated based on patient status, additional functional criteria and insurance authorization.  Follow Up Recommendations Skilled nursing-short term rehab (<3 hours/day) Can patient physically be transported by private vehicle: No    Assistance Recommended at Discharge Intermittent Supervision/Assistance  Patient can return home with the following  A lot of help with walking and/or transfers;A lot of help with bathing/dressing/bathroom;Assistance with cooking/housework;Assistance with feeding;Direct supervision/assist for medications management;Assist for transportation    Equipment Recommendations None recommended by PT  Recommendations for Other Services       Functional Status Assessment Patient has had a recent decline in their functional status and demonstrates the ability to make significant improvements in function in a reasonable and predictable amount of time.     Precautions / Restrictions Precautions Precautions: Fall;Knee Required Braces or Orthoses:  (Soft/Acrre wrapped) Restrictions Weight Bearing Restrictions: Yes RLE Weight Bearing: Weight bearing as tolerated      Mobility  Bed Mobility Overal bed mobility: Needs Assistance Bed Mobility: Supine to Sit     Supine to sit: Supervision     General bed mobility comments: 2/2 groginess due to anesthesia    Transfers Overall transfer level: Needs assistance Equipment used: Rolling walker (2 wheels) Transfers: Sit to/from Stand, Bed to chair/wheelchair/BSC Sit to Stand: Mod assist   Step pivot transfers: Mod assist       General transfer comment:  with increased dizziness and  nausea. WBAT to RLE    Ambulation/Gait               General Gait Details: Pt refused 2/2 to nausea and dizziness.  Stairs: N/A            Wheelchair Mobility: N/A    Modified Rankin (Stroke Patients Only)       Balance Overall  balance assessment: Needs assistance Sitting-balance support: Feet supported Sitting balance-Leahy Scale: Good     Standing balance support: Bilateral upper extremity supported Standing balance-Leahy Scale: Fair Standing balance comment: 2/2 lethargy, nausea and dizziness.                             Pertinent Vitals/Pain Pain Assessment Pain Assessment: 0-10 Pain Score: 5  Pain Location: R knee Pain Descriptors / Indicators: Discomfort    Home Living Family/patient expects to be discharged to:: Private residence Living Arrangements: Alone Available Help at Discharge: Family;Other (Comment) (daughter lives far.) Type of Home: Apartment Home Access: Level entry       Home Layout: One level Home Equipment: Conservation officer, nature (2 wheels);Rollator (4 wheels);Cane - single point;BSC/3in1;Shower seat;Grab bars - toilet;Grab bars - tub/shower;Toilet riser;Hand held shower head Additional Comments: Pt had L TKA a year ago    Prior Function Prior Level of Function : Independent/Modified Independent             Mobility Comments: Pt ind with ambulation at household level and community level with RW. ADLs Comments: Pt ind with ADLs and IADLs.     Hand Dominance        Extremity/Trunk Assessment   Upper Extremity Assessment Upper Extremity Assessment: Generalized weakness    Lower Extremity Assessment Lower Extremity Assessment: RLE deficits/detail RLE Deficits / Details: S/P TKA RLE: Unable to fully assess due to pain RLE Sensation: WNL       Communication   Communication: No difficulties  Cognition Arousal/Alertness: Awake/alert Behavior During Therapy: WFL for tasks assessed/performed Overall Cognitive Status: Within Functional Limits for tasks assessed                                          General Comments      Exercises Total Joint Exercises Ankle Circles/Pumps: AROM, 10 reps, Seated   Assessment/Plan    PT Assessment     PT Problem List         PT Treatment Interventions      PT Goals (Current goals can be found in the Care Plan section)  Acute Rehab PT Goals Patient Stated Goal: " I want to go to [facility] for rehab from here to get stronger." PT Goal Formulation: With patient Time For Goal Achievement: 11/07/22 Potential to Achieve Goals: Good Additional Goals Additional Goal #1: Pt will demonstrate 100% independence with TKA exeercises to demosntrate proper healing and strength.    Frequency       Co-evaluation               AM-PAC PT "6 Clicks" Mobility  Outcome Measure Help needed turning from your back to your side while in a flat bed without using bedrails?: A Little Help needed moving from lying on your back to sitting on the side of a flat bed without using bedrails?: A Little Help needed moving to and from a bed to a  chair (including a wheelchair)?: A Lot Help needed standing up from a chair using your arms (e.g., wheelchair or bedside chair)?: A Lot Help needed to walk in hospital room?: A Lot Help needed climbing 3-5 steps with a railing? : Total 6 Click Score: 13    End of Session              Time: 0154-0221 PT Time Calculation (min) (ACUTE ONLY): 27 min   Charges:   PT Evaluation $PT Eval Low Complexity: 1 Low PT Treatments $Therapeutic Activity: 8-22 mins       Britaney Espaillat PT DPT 3:01 PM,10/24/22

## 2022-10-24 NOTE — Anesthesia Postprocedure Evaluation (Signed)
Anesthesia Post Note  Patient: [name]  Procedure(s) Performed: TOTAL KNEE ARTHROPLASTY - RNFA (Right: Knee)  Patient location during evaluation: PACU Anesthesia Type: General Level of consciousness: awake and alert Pain management: pain level controlled Vital Signs Assessment: post-procedure vital signs reviewed and stable Respiratory status: spontaneous breathing, nonlabored ventilation, respiratory function stable and patient connected to nasal cannula oxygen Cardiovascular status: blood pressure returned to baseline and stable Postop Assessment: no apparent nausea or vomiting Anesthetic complications: no   No notable events documented.   Last Vitals:  Vitals:   10/24/22 1200 10/24/22 1249  BP: 123/63 (!) 121/54  Pulse: 84 90  Resp: 14 16  Temp:  36.5 C  SpO2: 94% 94%    Last Pain:  Vitals:   10/24/22 1322  TempSrc:   PainSc: 4                  Martha Clan

## 2022-10-25 ENCOUNTER — Encounter: Payer: Self-pay | Admitting: Surgery

## 2022-10-25 DIAGNOSIS — M1711 Unilateral primary osteoarthritis, right knee: Secondary | ICD-10-CM | POA: Diagnosis not present

## 2022-10-25 LAB — BASIC METABOLIC PANEL
Anion gap: 8 (ref 5–15)
BUN: 23 mg/dL (ref 8–23)
CO2: 23 mmol/L (ref 22–32)
Calcium: 8.2 mg/dL — ABNORMAL LOW (ref 8.9–10.3)
Chloride: 109 mmol/L (ref 98–111)
Creatinine, Ser: 0.96 mg/dL (ref 0.44–1.00)
GFR, Estimated: >60 mL/min (ref >60)
Glucose, Bld: 153 mg/dL — ABNORMAL HIGH (ref 70–99)
Potassium: 4 mmol/L (ref 3.5–5.1)
Sodium: 140 mmol/L (ref 135–145)

## 2022-10-25 LAB — CBC
HCT: 33.2 % — ABNORMAL LOW (ref 36.0–46.0)
Hemoglobin: 10.4 g/dL — ABNORMAL LOW (ref 12.0–15.0)
MCH: 27.5 pg (ref 26.0–34.0)
MCHC: 31.3 g/dL (ref 30.0–36.0)
MCV: 87.8 fL (ref 80.0–100.0)
Platelets: 182 10*3/uL (ref 150–400)
RBC: 3.78 MIL/uL — ABNORMAL LOW (ref 3.87–5.11)
RDW: 18.4 % — ABNORMAL HIGH (ref 11.5–15.5)
WBC: 8.6 10*3/uL (ref 4.0–10.5)
nRBC: 0 % (ref 0.0–0.2)

## 2022-10-25 MED ORDER — OXYCODONE HCL 5 MG PO TABS: 5.0000 mg | ORAL_TABLET | ORAL | 0 refills | Status: DC | PRN

## 2022-10-25 MED ORDER — ONDANSETRON HCL 4 MG PO TABS: 4.0000 mg | ORAL_TABLET | Freq: Four times a day (QID) | ORAL | 0 refills | Status: AC | PRN

## 2022-10-25 NOTE — Discharge Instructions (Addendum)
Diet: As you were doing prior to hospitalization   Shower:  May shower but keep the wounds dry, use an occlusive plastic wrap, NO SOAKING IN TUB.  If the bandage gets wet, change with a clean dry gauze.  Dressing:  Switch woundvac to standard dry dressing in 7 days, can switch it to dry dressing on 11/02/22.  Activity:  Increase activity slowly as tolerated, but follow the weight bearing instructions below.  No lifting or driving for 6 weeks.  Weight Bearing:   Weight bearing as tolerated to right lower extremity  To prevent constipation: you may use a stool softener such as -  Colace (over the counter) 100 mg by mouth twice a day  Drink plenty of fluids (prune juice may be helpful) and high fiber foods Miralax (over the counter) for constipation as needed.    Itching:  If you experience itching with your medications, try taking only a single pain pill, or even half a pain pill at a time.  You may take up to 10 pain pills per day, and you can also use benadryl over the counter for itching or also to help with sleep.   Precautions:  If you experience chest pain or shortness of breath - call 911 immediately for transfer to the hospital emergency department!!  If you develop a fever greater that 101 F, purulent drainage from wound, increased redness or drainage from wound, or calf pain-Call [place]                                              Follow- Up Appointment:  Please call for an appointment to be seen in 2 weeks at [facility]

## 2022-10-25 NOTE — Care Management Important Message (Signed)
Important Message  Patient Details  Name: [name] MRN: [id number] Date of Birth: Mar 18, 1951   Medicare Important Message Given:  Yes     Laurena Slimmer, RN 10/25/2022, 3:10 PM

## 2022-10-25 NOTE — Progress Notes (Signed)
Physical Therapy Treatment Patient Details Name: [name] MRN: [id number] DOB: 1951/03/18 Today's Date: 10/25/2022   History of Present Illness Ottie Neglia is a 71 y.o. female who presents for follow-up of her right knee pain secondary to advanced degenerative joint disease. The patient was last seen for these symptoms in April, 2023, and is scheduled for a right total knee arthroplasty on 10/24/2022. Pt is now S/P Right TKA using all-cemented Biomet Vanguard system with a 67.5 mm mm PCR femur, a 71 mm tibial tray with a 10 mm anterior stabilized E-poly insert, and a 34 x 7.8 mm all-poly 3-pegged domed patella. WBAT to RLE    PT Comments    Pt was A and O x 4. Agrees to session and remains motivated throughout. She was in recliner but agreeable to OOB/standing activity. Stood and ambulated ~ 40 ft with RW prior to c/o increased pain and fatigue. Demonstrated ~ 85 degrees flexion after performing several stretching exercises in recliner. Overall tolerated session well and is progressing towards all goals. Will not have assistance at home but will greatly benefit from SNF to maximize independence prior to returning home.    Recommendations for follow up therapy are one component of a multi-disciplinary discharge planning process, led by the attending physician.  Recommendations may be updated based on patient status, additional functional criteria and insurance authorization.  Follow Up Recommendations  Skilled nursing-short term rehab (<3 hours/day) Can patient physically be transported by private vehicle: No   Assistance Recommended at Discharge Intermittent Supervision/Assistance  Patient can return home with the following A little help with walking and/or transfers;A little help with bathing/dressing/bathroom;Assistance with cooking/housework;Assistance with feeding;Direct supervision/assist for medications management;Direct supervision/assist for financial management;Assist for  transportation;Help with stairs or ramp for entrance   Equipment Recommendations  None recommended by PT       Precautions / Restrictions Precautions Precautions: Fall;Knee Restrictions Weight Bearing Restrictions: Yes RLE Weight Bearing: Weight bearing as tolerated     Mobility  Bed Mobility  General bed mobility comments: In recliner pre/post session    Transfers Overall transfer level: Needs assistance Equipment used: Rolling walker (2 wheels) Transfers: Sit to/from Stand Sit to Stand: Min guard  General transfer comment: CGA for safety. vcs for improved technique    Ambulation/Gait Ambulation/Gait assistance: Min guard, Min assist Gait Distance (Feet): 40 Feet Assistive device: Rolling walker (2 wheels) Gait Pattern/deviations: Step-to pattern Gait velocity: decreased  General Gait Details: Pt was able to ambulate ~ 40 ft with RW with slow, antalgic step to pattern.    Balance Overall balance assessment: Needs assistance Sitting-balance support: Feet supported Sitting balance-Leahy Scale: Good   Standing balance support: Bilateral upper extremity supported Standing balance-Leahy Scale: Fair    Cognition Arousal/Alertness: Awake/alert Behavior During Therapy: WFL for tasks assessed/performed Overall Cognitive Status: Within Functional Limits for tasks assessed    General Comments: Pt is A and O x 4    Exercises Total Joint Exercises Knee Flexion: Seated, 5 reps Goniometric ROM: ~4-85 degrees        Pertinent Vitals/Pain Pain Assessment Pain Assessment: 0-10 Pain Score: 6  Pain Location: R knee Pain Descriptors / Indicators: Discomfort Pain Intervention(s): Limited activity within patient's tolerance, Monitored during session, Premedicated before session, Repositioned, Ice applied     PT Goals (current goals can now be found in the care plan section) Acute Rehab PT Goals Patient Stated Goal: rehab then home Progress towards PT goals: Progressing  toward goals    Frequency  BID      PT Plan Current plan remains appropriate       AM-PAC PT "6 Clicks" Mobility   Outcome Measure  Help needed turning from your back to your side while in a flat bed without using bedrails?: A Little Help needed moving from lying on your back to sitting on the side of a flat bed without using bedrails?: A Little Help needed moving to and from a bed to a chair (including a wheelchair)?: A Little Help needed standing up from a chair using your arms (e.g., wheelchair or bedside chair)?: A Little Help needed to walk in hospital room?: A Little Help needed climbing 3-5 steps with a railing? : A Lot 6 Click Score: 17    End of Session Equipment Utilized During Treatment: Gait belt Activity Tolerance: Patient tolerated treatment well Patient left: in chair;with call bell/phone within reach;with chair alarm set Nurse Communication: Mobility status       Time: 4799-8721 PT Time Calculation (min) (ACUTE ONLY): 20 min  Charges:  $Gait Training: 8-22 mins                     Julaine Fusi PTA 10/25/22, 1:45 PM

## 2022-10-25 NOTE — Care Management CC44 (Signed)
Condition Code 44 Documentation Completed  Patient Details  Name: ERMINIA MCNEW MRN: [id number] Date of Birth: Mar 22, 1951   Condition Code 44 given:  Yes Patient signature on Condition Code 44 notice:  Yes Documentation of 2 MD's agreement:  Yes Code 44 added to claim:  Yes    Laurena Slimmer, RN 10/25/2022, 3:13 PM

## 2022-10-25 NOTE — Progress Notes (Signed)
Physical Therapy Treatment Patient Details Name: [name] MRN: [id number] DOB: [birth year] Today's Date: 10/25/2022   History of Present Illness [name] is a 71 y.o. female who presents for follow-up of her right knee pain secondary to advanced degenerative joint disease. The patient was last seen for these symptoms in April, 2023, and is scheduled for a right total knee arthroplasty on 10/24/2022. Pt is now S/P Right TKA using all-cemented Biomet Vanguard system with a 67.5 mm mm PCR femur, a 71 mm tibial tray with a 10 mm anterior stabilized E-poly insert, and a 34 x 7.8 mm all-poly 3-pegged domed patella. WBAT to RLE       Recommendations for follow up therapy are one component of a multi-disciplinary discharge planning process, led by the attending physician.  Recommendations may be updated based on patient status, additional functional criteria and insurance authorization.  Follow Up Recommendations  Skilled nursing-short term rehab (<3 hours/day)     Assistance Recommended at Discharge Intermittent Supervision/Assistance  Patient can return home with the following A little help with walking and/or transfers;A little help with bathing/dressing/bathroom;Assistance with cooking/housework;Assistance with feeding;Direct supervision/assist for medications management;Direct supervision/assist for financial management;Assist for transportation;Help with stairs or ramp for entrance   Equipment Recommendations  None recommended by PT       Precautions / Restrictions Precautions Precautions: Fall;Knee Restrictions Weight Bearing Restrictions: Yes RLE Weight Bearing: Weight bearing as tolerated     Mobility  Bed Mobility Overal bed mobility: Needs Assistance Bed Mobility: Sit to Supine  Sit to supine: Min assist  General bed mobility comments: In recliner pre/post session    Transfers Overall transfer level: Needs assistance Equipment used: Rolling walker (2  wheels) Transfers: Sit to/from Stand Sit to Stand: Min guard    General transfer comment: CGA for safety    Ambulation/Gait Ambulation/Gait assistance: Min guard, Min assist Gait Distance (Feet): 50 Feet Assistive device: Rolling walker (2 wheels) Gait Pattern/deviations: Step-to pattern Gait velocity: decreased     General Gait Details: pt fatigues quickly and towards the last 20 ft needed min assist. Will continue to progress gait and strength per current POC   Balance Overall balance assessment: Needs assistance Sitting-balance support: Feet supported Sitting balance-Leahy Scale: Good     Standing balance support: Bilateral upper extremity supported Standing balance-Leahy Scale: Fair       Cognition Arousal/Alertness: Awake/alert Behavior During Therapy: WFL for tasks assessed/performed Overall Cognitive Status: Within Functional Limits for tasks assessed      General Comments: Pt is A and O x 4        Exercises Total Joint Exercises Ankle Circles/Pumps: AROM, 10 reps, Seated Quad Sets: AROM, 10 reps Heel Slides: AROM, 10 reps Hip ABduction/ADduction: AROM, 10 reps Straight Leg Raises: AROM, 10 reps Long Arc Quad: AROM, 10 reps Knee Flexion: Seated, 5 reps Goniometric ROM: ~4-85 degrees    General Comments General comments (skin integrity, edema, etc.): Pt was able to tolerate performance of HEP handout for total knee. see exercises listed below      Pertinent Vitals/Pain Pain Assessment Pain Assessment: 0-10 Pain Score: 7  Pain Location: R knee Pain Descriptors / Indicators: Discomfort Pain Intervention(s): Limited activity within patient's tolerance, Monitored during session, Premedicated before session, Repositioned     PT Goals (current goals can now be found in the care plan section) Acute Rehab PT Goals Patient Stated Goal: rehab then home Progress towards PT goals: Progressing toward goals    Frequency    BID  PT Plan Current  plan remains appropriate       AM-PAC PT "6 Clicks" Mobility   Outcome Measure  Help needed turning from your back to your side while in a flat bed without using bedrails?: A Little Help needed moving from lying on your back to sitting on the side of a flat bed without using bedrails?: A Little Help needed moving to and from a bed to a chair (including a wheelchair)?: A Little Help needed standing up from a chair using your arms (e.g., wheelchair or bedside chair)?: A Little Help needed to walk in hospital room?: A Little Help needed climbing 3-5 steps with a railing? : A Lot 6 Click Score: 17    End of Session Equipment Utilized During Treatment: Gait belt Activity Tolerance: Patient tolerated treatment well;Patient limited by fatigue;Patient limited by pain Patient left: in bed;with call bell/phone within reach;with bed alarm set;with family/visitor present Nurse Communication: Mobility status PT Visit Diagnosis: Unsteadiness on feet (R26.81)     Time: 6484-7207 PT Time Calculation (min) (ACUTE ONLY): 12 min  Charges:  $Therapeutic Exercise: 8-22 mins                     Julaine Fusi PTA 10/25/22, 5:21 PM

## 2022-10-25 NOTE — Progress Notes (Signed)
Subjective: 1 Day Post-Op Procedure(s) (LRB): TOTAL KNEE ARTHROPLASTY - RNFA (Right) Patient reports pain as mild.   Patient is well, and has had no acute complaints or problems Plan is to go Skilled nursing facility after hospital stay. Negative for chest pain and shortness of breath Fever: no Gastrointestinal:Negative for nausea and vomiting Patient reports that she is passing gas this morning.  Objective: Vital signs in last 24 hours: Temp:  [97 F (36.1 C)-99.3 F (37.4 C)] 97.6 F (36.4 C) (11/08 0451) Pulse Rate:  [57-109] 57 (11/08 0451) Resp:  [12-18] 16 (11/08 0451) BP: (115-142)/(54-77) 124/62 (11/08 0451) SpO2:  [91 %-98 %] 97 % (11/08 0451)  Intake/Output from previous day:  Intake/Output Summary (Last 24 hours) at 10/25/2022 0735 Last data filed at 10/25/2022 0451 Gross per 24 hour  Intake 1400 ml  Output 595 ml  Net 805 ml    Intake/Output this shift: No intake/output data recorded.  Labs: Recent Labs    10/25/22 0543  HGB 10.4*   Recent Labs    10/25/22 0543  WBC 8.6  RBC 3.78*  HCT 33.2*  PLT 182   Recent Labs    10/25/22 0543  NA 140  K 4.0  CL 109  CO2 23  BUN 23  CREATININE 0.96  GLUCOSE 153*  CALCIUM 8.2*   No results for input(s): "LABPT", "INR" in the last 72 hours.   EXAM General - Patient is Alert, Appropriate, and Oriented Extremity - ABD soft Neurovascular intact Intact pulses distally Dorsiflexion/Plantar flexion intact Incision: moderate drainage No cellulitis present Compartment soft Dressing/Incision - Moderate bloody drainage noted to the right knee honeycomb dressing.  Dressing changed, no active bleeding noted to the knee.  Small area of bloody drainage along the proximal aspect of the incision site. Motor Function - intact, moving foot and toes well on exam.  Abdomen soft to palpation.  Intact bowel sounds.  Past Medical History:  Diagnosis Date   Anemia    Anesthesia complication    a.) (+) delayed  emergence   Anxiety    Asthma    Atrial fibrillation ([place])    a.) CHA2DS2VASc = 3 (age, sex, HTN);  b.) rate/rhythm maintained without pharmacological interventions; chronically anticoagulated with apixaban   Cellulitis    CKD (chronic kidney disease), stage IV (HCC)    COPD (chronic obstructive pulmonary disease) (HCC)    GERD (gastroesophageal reflux disease)    History of MRSA infection    HLD (hyperlipidemia)    Hypertension    Incomplete right bundle branch block (RBBB)    Long term (current) use of anticoagulants    a.) Apixaban   Long term current use of immunosuppressive drug    a.) MTX + hydroxychloroquine for RA   Lymphedema    Mild protein-calorie malnutrition (HCC)    Multinodular thyroid    Osteoarthritis    Rheumatoid arthritis (HCC)     Assessment/Plan: 1 Day Post-Op Procedure(s) (LRB): TOTAL KNEE ARTHROPLASTY - RNFA (Right) Principal Problem:   Status post total knee replacement using cement, right  Estimated body mass index is 36.44 kg/m as calculated from the following:   Height as of this encounter: 5\' 5"  (1.651 m).   Weight as of this encounter: 99.3 kg. Advance diet Up with therapy D/C IV fluids when tolerating po intake.  Labs reviewed this AM, Vitals stable. Honeycomb dressing changed this morning.  If continued drainage issues will plan on applying a provena woundvac. Up with therapy today.  Plan for discharge to  SNF, care management to assist with discharge planning. Plan for discharge to SNF once insurance approval has been obtained.  DVT Prophylaxis - TED hose and Eliquis Weight-Bearing as tolerated to right leg  J. Cameron Proud, PA-C [facility] Orthopaedic Surgery 10/25/2022, 7:35 AM

## 2022-10-25 NOTE — Plan of Care (Signed)

## 2022-10-25 NOTE — NC FL2 (Signed)
[place] LEVEL OF CARE SCREENING TOOL     IDENTIFICATION  Patient Name: [name] Birthdate: 08-03-1951 Sex: female Admission Date (Current Location): 10/24/2022  [facility] and [place] Number:  Engineering geologist and Address:  [facility], [address] [place], [place] [address]      Provider Number: [id number]  Attending Physician Name and Address:  Corky Mull, MD  Relative Name and Phone Number:  Latiqua Daloia, [phone]    Current Level of Care: Hospital Recommended Level of Care: [place] Prior Approval Number:    Date Approved/Denied:   PASRR Number: [id number]  Discharge Plan: SNF    Current Diagnoses: Patient Active Problem List   Diagnosis Date Noted   Status post total knee replacement using cement, right 10/24/2022   Status post total knee replacement using cement, left 10/18/2021   Anemia 07/07/2021   Mild protein-calorie malnutrition ([place]) 07/07/2021   Ulcer of great toe ([place]) 03/17/2021   Lymphedema 03/17/2021   Hypokalemia 11/02/2020   Hypomagnesemia 11/02/2020   Paroxysmal A-fib ([place]) 10/21/2020   Hyperlipidemia, mixed 10/21/2020   Chronic right shoulder pain 09/14/2020   Dysuria 09/14/2020   Fall 09/14/2020   Leukocytosis 09/14/2020   Dizziness 09/13/2020   Weakness 09/13/2020   Primary osteoarthritis of both knees 03/19/2017   Degenerative arthritis of right knee 07/23/2014   Allergic rhinitis 07/22/2014   Anxiety 07/22/2014   Benign essential hypertension 07/22/2014   GERD (gastroesophageal reflux disease) 07/22/2014    Orientation RESPIRATION BLADDER Height & Weight     Self, Time, Situation, Place  Normal Continent Weight: 99.3 kg Height:  5\' 5"  (165.1 cm)  BEHAVIORAL SYMPTOMS/MOOD NEUROLOGICAL BOWEL NUTRITION STATUS  Other (Comment) (n/a)  (n/a) Continent Diet (Heart)  AMBULATORY STATUS COMMUNICATION OF NEEDS Skin   Limited Assist Verbally Other (Comment)  (Incision R knee)                       Personal Care Assistance Level of Assistance  Bathing, Dressing Bathing Assistance: Limited assistance   Dressing Assistance: Limited assistance     Functional Limitations Info             SPECIAL CARE FACTORS FREQUENCY  PT (By licensed PT)     PT Frequency: min 2x weekly              Contractures Contractures Info: Not present    Additional Factors Info  Code Status, Allergies Code Status Info: FULL Allergies Info: Sulfa Antibiotics, Amoxicillin-pot Clavulanate, Valdecoxib           Current Medications (10/25/2022):  This is the current hospital active medication list Current Facility-Administered Medications  Medication Dose Route Frequency Provider Last Rate Last Admin   0.9 %  sodium chloride infusion   Intravenous Continuous Poggi, Marshall Cork, MD 75 mL/hr at 10/24/22 1329 New Bag at 10/24/22 1329   acetaminophen (TYLENOL) tablet 325-650 mg  325-650 mg Oral Q6H PRN Poggi, Marshall Cork, MD       albuterol (PROVENTIL) (2.5 MG/3ML) 0.083% nebulizer solution 3 mL  3 mL Inhalation Q6H PRN Poggi, Marshall Cork, MD       amLODipine (NORVASC) tablet 10 mg  10 mg Oral Daily Poggi, Marshall Cork, MD   10 mg at 10/25/22 0959   apixaban (ELIQUIS) tablet 5 mg  5 mg Oral BID Poggi, Marshall Cork, MD   5 mg at 10/25/22 0959   atorvastatin (LIPITOR) tablet 5 mg  5 mg  Oral QHS Poggi, Marshall Cork, MD   5 mg at 10/24/22 2236   bisacodyl (DULCOLAX) suppository 10 mg  10 mg Rectal Daily PRN Poggi, Marshall Cork, MD       diphenhydrAMINE (BENADRYL) 12.5 MG/5ML elixir 12.5-25 mg  12.5-25 mg Oral Q4H PRN Poggi, Marshall Cork, MD       docusate sodium (COLACE) capsule 100 mg  100 mg Oral BID Poggi, Marshall Cork, MD   100 mg at 10/25/22 0174   ferrous sulfate tablet 325 mg  325 mg Oral Q breakfast Poggi, Marshall Cork, MD   325 mg at 94/49/67 5916   folic acid (FOLVITE) tablet 1 mg  1 mg Oral Daily Poggi, Marshall Cork, MD   1 mg at 10/25/22 3846   hydroxychloroquine (PLAQUENIL) tablet 200 mg  200 mg Oral BID  Corky Mull, MD   200 mg at 10/25/22 6599   hydrOXYzine (ATARAX) tablet 25 mg  25 mg Oral Daily Poggi, Marshall Cork, MD   25 mg at 10/24/22 2237   loratadine (CLARITIN) tablet 10 mg  10 mg Oral Daily Poggi, Marshall Cork, MD   10 mg at 10/25/22 0959   magnesium hydroxide (MILK OF MAGNESIA) suspension 30 mL  30 mL Oral Daily PRN Corky Mull, MD   30 mL at 10/25/22 0650   [START ON 10/26/2022] methotrexate (RHEUMATREX) tablet 15 mg  15 mg Oral Once per day on Thu Merrill, Kristin A, RPH       metoCLOPramide (REGLAN) tablet 5-10 mg  5-10 mg Oral Q8H PRN Poggi, Marshall Cork, MD       Or   metoCLOPramide (REGLAN) injection 5-10 mg  5-10 mg Intravenous Q8H PRN Poggi, Marshall Cork, MD       mometasone-formoterol (DULERA) 100-5 MCG/ACT inhaler 2 puff  2 puff Inhalation BID Poggi, Marshall Cork, MD   2 puff at 10/25/22 1000   ondansetron (ZOFRAN) tablet 4 mg  4 mg Oral Q6H PRN Poggi, Marshall Cork, MD       Or   ondansetron (ZOFRAN) injection 4 mg  4 mg Intravenous Q6H PRN Poggi, Marshall Cork, MD       oxyCODONE (Oxy IR/ROXICODONE) immediate release tablet 5-10 mg  5-10 mg Oral Q4H PRN Poggi, Marshall Cork, MD   10 mg at 10/25/22 1154   PARoxetine (PAXIL) tablet 30 mg  30 mg Oral Daily Poggi, Marshall Cork, MD   30 mg at 10/25/22 1000   potassium chloride (KLOR-CON M) CR tablet 10 mEq  10 mEq Oral Daily Poggi, Marshall Cork, MD   10 mEq at 10/25/22 0959   sodium phosphate (FLEET) 7-19 GM/118ML enema 1 enema  1 enema Rectal Once PRN Poggi, Marshall Cork, MD         Discharge Medications: Please see discharge summary for a list of discharge medications.  Relevant Imaging Results:  Relevant Lab Results:   Additional Information SS# [id number]  Laurena Slimmer, RN

## 2022-10-25 NOTE — Progress Notes (Signed)
Surgical dressing saturated this morning. Dressing reinforced with ABD and ace wrap. Day shift RN, Ana at bedside.

## 2022-10-26 ENCOUNTER — Encounter: Payer: Self-pay | Admitting: Surgery

## 2022-10-26 DIAGNOSIS — M1711 Unilateral primary osteoarthritis, right knee: Secondary | ICD-10-CM | POA: Diagnosis not present

## 2022-10-26 LAB — CBC
HCT: 30.7 % — ABNORMAL LOW (ref 36.0–46.0)
Hemoglobin: 9.5 g/dL — ABNORMAL LOW (ref 12.0–15.0)
MCH: 27.3 pg (ref 26.0–34.0)
MCHC: 30.9 g/dL (ref 30.0–36.0)
MCV: 88.2 fL (ref 80.0–100.0)
Platelets: 226 10*3/uL (ref 150–400)
RBC: 3.48 MIL/uL — ABNORMAL LOW (ref 3.87–5.11)
RDW: 19 % — ABNORMAL HIGH (ref 11.5–15.5)
WBC: 9.1 10*3/uL (ref 4.0–10.5)
nRBC: 0 % (ref 0.0–0.2)

## 2022-10-26 NOTE — Progress Notes (Signed)
Subjective: 2 Days Post-Op Procedure(s) (LRB): TOTAL KNEE ARTHROPLASTY - RNFA (Right) Patient reports pain as mild.  Does report some increase pain in the right knee.  Patient is well, and has had no acute complaints or problems Plan is to go Skilled nursing facility after hospital stay. Negative for chest pain and shortness of breath Fever: no Gastrointestinal:Negative for nausea and vomiting Patient reports that she is passing gas this morning.  Objective: Vital signs in last 24 hours: Temp:  [98 F (36.7 C)-98.3 F (36.8 C)] 98.3 F (36.8 C) (11/09 0730) Pulse Rate:  [53-74] 70 (11/09 0730) Resp:  [16-20] 16 (11/09 0730) BP: (106-140)/(48-74) 130/74 (11/09 0730) SpO2:  [92 %-96 %] 94 % (11/09 0730)  Intake/Output from previous day:  Intake/Output Summary (Last 24 hours) at 10/26/2022 1017 Last data filed at 10/25/2022 1855 Gross per 24 hour  Intake 340 ml  Output --  Net 340 ml    Intake/Output this shift: No intake/output data recorded.  Labs: Recent Labs    10/25/22 0543 10/26/22 0341  HGB 10.4* 9.5*   Recent Labs    10/25/22 0543 10/26/22 0341  WBC 8.6 9.1  RBC 3.78* 3.48*  HCT 33.2* 30.7*  PLT 182 226   Recent Labs    10/25/22 0543  NA 140  K 4.0  CL 109  CO2 23  BUN 23  CREATININE 0.96  GLUCOSE 153*  CALCIUM 8.2*   No results for input(s): "LABPT", "INR" in the last 72 hours.   EXAM General - Patient is Alert, Appropriate, and Oriented Extremity - ABD soft Neurovascular intact Intact pulses distally Dorsiflexion/Plantar flexion intact Incision: moderate drainage No cellulitis present Compartment soft Dressing/Incision - Moderate bloody drainage noted to the right knee honeycomb dressing.  Dressing changed, no active bleeding noted to the knee.  Small area of bloody drainage along the proximal aspect of the incision site.  Prevena woundvac was applied to the right knee this morning. Motor Function - intact, moving foot and toes well on  exam.  Abdomen soft to palpation.  Intact bowel sounds.  Past Medical History:  Diagnosis Date   Anemia    Anesthesia complication    a.) (+) delayed emergence   Anxiety    Asthma    Atrial fibrillation ([place])    a.) CHA2DS2VASc = 3 (age, sex, HTN);  b.) rate/rhythm maintained without pharmacological interventions; chronically anticoagulated with apixaban   Cellulitis    CKD (chronic kidney disease), stage IV (HCC)    COPD (chronic obstructive pulmonary disease) (HCC)    GERD (gastroesophageal reflux disease)    History of MRSA infection    HLD (hyperlipidemia)    Hypertension    Incomplete right bundle branch block (RBBB)    Long term (current) use of anticoagulants    a.) Apixaban   Long term current use of immunosuppressive drug    a.) MTX + hydroxychloroquine for RA   Lymphedema    Mild protein-calorie malnutrition (HCC)    Multinodular thyroid    Osteoarthritis    Rheumatoid arthritis (HCC)     Assessment/Plan: 2 Days Post-Op Procedure(s) (LRB): TOTAL KNEE ARTHROPLASTY - RNFA (Right) Principal Problem:   Status post total knee replacement using cement, right  Estimated body mass index is 36.44 kg/m as calculated from the following:   Height as of this encounter: 5\' 5"  (1.651 m).   Weight as of this encounter: 99.3 kg. Advance diet Up with therapy D/C IV fluids when tolerating po intake.  Labs reviewed this AM,  Vitals stable.  Hg 9.5 this morning. Honeycomb with continued moderate bloody drainage, Prevena woundvac applied.  Will discharge to SNF with Prevena, SNF can change to standard dry dressing in 7-10 days. Up with therapy today.  Plan for discharge to SNF, care management to assist with discharge planning. Patient is passing gas without pain this morning. Plan for discharge to SNF possibly today pending insurance auth.  DVT Prophylaxis - TED hose and Eliquis Weight-Bearing as tolerated to right leg  J. Cameron Proud, PA-C [facility] 10/26/2022, 10:17 AM

## 2022-10-26 NOTE — TOC Progression Note (Addendum)
Transition of Care [facility]) - Progression Note    Patient Details  Name: [name] MRN: [id number] Date of Birth: [birth year]  Transition of [facility] CM/SW Contact  Laurena Slimmer, RN Phone Number: 10/26/2022, 1:59 PM  Clinical Narrative:    Contacted [facility]. Spoke with Kieth Brightly in admissions for update on referral. She will review referral and call this RNCM back with a determination.   Retrieved a message from admissions at [facility]. Patient extended a bed offer. Patient informed and agreed to bed offer. Auth started.         Expected Discharge Plan and Services           Expected Discharge Date: 10/26/22                                     Social Determinants of Health (SDOH) Interventions    Readmission Risk Interventions     No data to display

## 2022-10-26 NOTE — Plan of Care (Signed)
  Problem: Clinical Measurements: Goal: Ability to maintain clinical measurements within normal limits will improve Outcome: Progressing   Problem: Clinical Measurements: Goal: Diagnostic test results will improve Outcome: Progressing   Problem: Nutrition: Goal: Adequate nutrition will be maintained Outcome: Progressing   Problem: Elimination: Goal: Will not experience complications related to urinary retention Outcome: Progressing   Problem: Safety: Goal: Ability to remain free from injury will improve Outcome: Progressing

## 2022-10-26 NOTE — Progress Notes (Signed)
PT Cancellation Note  Patient Details Name: [name] MRN: [id number] DOB: 18-May-1951   Cancelled Treatment:     I just got back into bed. Pt is progressing well with her PT. Will return in the morning as requested.    Willette Pa 10/26/2022, 4:53 PM

## 2022-10-26 NOTE — Discharge Summary (Addendum)
Physician Discharge Summary  Patient ID: [name] MRN: [id number] DOB/AGE: Apr 26, 1951 71 y.o.  Admit date: 10/24/2022 Discharge date: 10/27/2022  Admission Diagnoses:  Status post total knee replacement using cement, right [Z96.651] Degenerative joint disease of the right knee.  Discharge Diagnoses: Patient Active Problem List   Diagnosis Date Noted   Status post total knee replacement using cement, right 10/24/2022   Status post total knee replacement using cement, left 10/18/2021   Anemia 07/07/2021   Mild protein-calorie malnutrition ([place]) 07/07/2021   Ulcer of great toe ([place]) 03/17/2021   Lymphedema 03/17/2021   Hypokalemia 11/02/2020   Hypomagnesemia 11/02/2020   Paroxysmal A-fib ([place]) 10/21/2020   Hyperlipidemia, mixed 10/21/2020   Chronic right shoulder pain 09/14/2020   Dysuria 09/14/2020   Fall 09/14/2020   Leukocytosis 09/14/2020   Dizziness 09/13/2020   Weakness 09/13/2020   Primary osteoarthritis of both knees 03/19/2017   Degenerative arthritis of right knee 07/23/2014   Allergic rhinitis 07/22/2014   Anxiety 07/22/2014   Benign essential hypertension 07/22/2014   GERD (gastroesophageal reflux disease) 07/22/2014    Past Medical History:  Diagnosis Date   Anemia    Anesthesia complication    a.) (+) delayed emergence   Anxiety    Asthma    Atrial fibrillation ([place])    a.) CHA2DS2VASc = 3 (age, sex, HTN);  b.) rate/rhythm maintained without pharmacological interventions; chronically anticoagulated with apixaban   Cellulitis    CKD (chronic kidney disease), stage IV (HCC)    COPD (chronic obstructive pulmonary disease) (HCC)    GERD (gastroesophageal reflux disease)    History of MRSA infection    HLD (hyperlipidemia)    Hypertension    Incomplete right bundle branch block (RBBB)    Long term (current) use of anticoagulants    a.) Apixaban   Long term current use of immunosuppressive drug    a.) MTX + hydroxychloroquine for RA   Lymphedema     Mild protein-calorie malnutrition (HCC)    Multinodular thyroid    Osteoarthritis    Rheumatoid arthritis ([place])      Transfusion: None.   Consultants (if any):   Discharged Condition: Improved  Hospital Course: [name] is an 71 y.o. female who was admitted 10/24/2022 with a diagnosis of degenerative joint disease of the right knee and went to the operating room on 10/24/2022 and underwent the above named procedures.    Surgeries: Procedure(s): TOTAL KNEE ARTHROPLASTY - RNFA on 10/24/2022 Patient tolerated the surgery well. Taken to PACU where she was stabilized and then transferred to the orthopedic floor.  Continued on Eliquis 5mg  twice daily following surgery.. Foot pumps applied bilaterally at 80 mm. Heels elevated on bed with rolled towels. No evidence of DVT. Negative [place]. Physical therapy started on day #1 for gait training and transfer. OT started day #1 for ADL and assisted devices.  Patient's IV was removed on POD1.  Patient did experience moderate bloody drainage to the right knee.  On POD2 a Prevena was applied to the right knee, patient can discharge to SNF with Prevena intact.  Change over to a dry dressing in 7 days following discharge.  Patient had a BM on POD2.  Implants: Right TKA using all-cemented [employer] Vanguard system with a 67.5 mm mm PCR femur, a 71 mm tibial tray with a 10 mm anterior stabilized E-poly insert, and a 34 x 7.8 mm all-poly 3-pegged domed patella.   She was given perioperative antibiotics:  Anti-infectives (From admission, onward)  Start     Dose/Rate Route Frequency Ordered Stop   10/25/22 1000  hydroxychloroquine (PLAQUENIL) tablet 200 mg        200 mg Oral 2 times daily 10/24/22 1254     10/24/22 1345  ceFAZolin (ANCEF) IVPB 2g/100 mL premix        2 g 200 mL/hr over 30 Minutes Intravenous Every 6 hours 10/24/22 1254 10/25/22 0414   10/24/22 0615  ceFAZolin (ANCEF) 2-4 GM/100ML-% IVPB       Note to Pharmacy: [place] Nail E:  cabinet override      10/24/22 0615 10/24/22 1401   10/24/22 0600  ceFAZolin (ANCEF) IVPB 2g/100 mL premix        2 g 200 mL/hr over 30 Minutes Intravenous On call to O.R. 10/23/22 2320 10/24/22 0746     .  She was given sequential compression devices, early ambulation, and Eliquis for DVT prophylaxis.  She benefited maximally from the hospital stay and there were no complications.    Recent vital signs:  Vitals:   10/26/22 1158 10/27/22 0125  BP: 135/66 (!) 124/59  Pulse: 71 69  Resp: 16 18  Temp: 97.9 F (36.6 C) 98.3 F (36.8 C)  SpO2: 99% 100%    Recent laboratory studies:  Lab Results  Component Value Date   HGB 9.5 (L) 10/26/2022   HGB 10.4 (L) 10/25/2022   HGB 12.1 10/12/2022   Lab Results  Component Value Date   WBC 9.1 10/26/2022   PLT 226 10/26/2022   Lab Results  Component Value Date   INR 1.5 (H) 10/12/2021   Lab Results  Component Value Date   NA 140 10/25/2022   K 4.0 10/25/2022   CL 109 10/25/2022   CO2 23 10/25/2022   BUN 23 10/25/2022   CREATININE 0.96 10/25/2022   GLUCOSE 153 (H) 10/25/2022    Discharge Medications:   Allergies as of 10/27/2022       Reactions   Sulfa Antibiotics Hives, Itching   Leflunomide Itching   Amoxicillin-pot Clavulanate Nausea Only   Valdecoxib Rash        Medication List     STOP taking these medications    traMADol 50 MG tablet Commonly known as: ULTRAM       TAKE these medications    acetaminophen 500 MG tablet Commonly known as: TYLENOL Take 1-2 tablets (500-1,000 mg total) by mouth every 6 (six) hours as needed for mild pain (pain score 1-3 or temp > 100.5).   albuterol 108 (90 Base) MCG/ACT inhaler Commonly known as: VENTOLIN HFA Inhale 2 puffs into the lungs every 6 (six) hours as needed for wheezing or shortness of breath.   amLODipine 10 MG tablet Commonly known as: NORVASC Take 10 mg by mouth daily.   apixaban 5 MG Tabs tablet Commonly known as: ELIQUIS Take 5 mg by mouth 2  (two) times daily.   atorvastatin 10 MG tablet Commonly known as: LIPITOR Take 5 mg by mouth at bedtime.   budesonide-formoterol 80-4.5 MCG/ACT inhaler Commonly known as: SYMBICORT Inhale 2 puffs into the lungs 2 (two) times daily as needed (shortness of breath or wheezing).   cyclobenzaprine 5 MG tablet Commonly known as: FLEXERIL Take 1 tablet (5 mg total) by mouth 3 (three) times daily as needed for muscle spasms.   docusate sodium 100 MG capsule Commonly known as: COLACE Take 1 capsule (100 mg total) by mouth 2 (two) times daily.   ferrous sulfate 325 (65 FE) MG tablet Take 325  mg by mouth daily with breakfast.   folic acid 1 MG tablet Commonly known as: FOLVITE Take 1 mg by mouth daily.   hydroxychloroquine 200 MG tablet Commonly known as: PLAQUENIL Take 200 mg by mouth 2 (two) times daily.   hydrOXYzine 25 MG tablet Commonly known as: ATARAX Take 25 mg by mouth daily. At HS - 2 tablets   loratadine 10 MG tablet Commonly known as: CLARITIN Take 10 mg by mouth daily.   methotrexate 2.5 MG tablet Commonly known as: RHEUMATREX Take 2.5 mg by mouth once a week. Caution:Chemotherapy. Protect from light. Take 6 tablets every 7 days on Thursday   mometasone 0.1 % ointment Commonly known as: ELOCON Apply topically daily.   ondansetron 4 MG tablet Commonly known as: ZOFRAN Take 1 tablet (4 mg total) by mouth every 6 (six) hours as needed for nausea.   oxyCODONE 5 MG immediate release tablet Commonly known as: Oxy IR/ROXICODONE Take 1-2 tablets (5-10 mg total) by mouth every 4 (four) hours as needed for severe pain.   PARoxetine 30 MG tablet Commonly known as: PAXIL Take 30 mg by mouth daily.   potassium chloride 10 MEQ tablet Commonly known as: KLOR-CON Take 10 mEq by mouth daily.   triamcinolone 0.025 % cream Commonly known as: KENALOG Apply 1 Application topically daily.   triamcinolone cream 0.1 % Commonly known as: KENALOG Apply 1 Application  topically 2 (two) times daily.        Diagnostic Studies: DG Knee Right Port  Result Date: 10/24/2022 CLINICAL DATA:  Status post right knee replacement. EXAM: PORTABLE RIGHT KNEE - 1-2 VIEW COMPARISON:  September 12, 2021. FINDINGS: The right femoral and tibial components are well situated. Expected postoperative changes seen in the soft tissues anteriorly. IMPRESSION: Status post right total knee arthroplasty. Electronically Signed   By: Marijo Conception M.D.   On: 10/24/2022 11:35   US THYROID  Result Date: 10/11/2022 CLINICAL DATA:  Goiter. EXAM: THYROID ULTRASOUND TECHNIQUE: Ultrasound examination of the thyroid gland and adjacent soft tissues was performed. COMPARISON:  None Available. FINDINGS: Parenchymal Echotexture: Markedly heterogenous Isthmus: 0.5 cm Right lobe: 5.5 x 3.6 x 1.9 cm Left lobe: 5.6 x 3.3 x 3.4 cm _________________________________________________________ Estimated total number of nodules >/= 1 cm: 2 Number of spongiform nodules >/=  2 cm not described below (TR1): 0 Number of mixed cystic and solid nodules >/= 1.5 cm not described below (TR2): 0 _________________________________________________________ Diffusely enlarged, heterogeneous thyroid gland containing multiple mixed cystic and solid nodules in both the right and left aspect of the gland. The cystic and solid nodules demonstrate an isoechoic cystic component and are all consistent with TI-RADS category 2 low risk/benign. No lesions identified that would meet criteria to warrant biopsy or dedicated imaging surveillance. IMPRESSION: Enlarged, heterogeneous and multinodular thyroid gland. Visualized nodules are considered sonographically low risk/benign (TI-RADS category 2) and do not warrant further evaluation. The above is in keeping with the ACR TI-RADS recommendations - J Am Coll Radiol 2017;14:587-595. Electronically Signed   By: Jacqulynn Cadet M.D.   On: 10/11/2022 16:08    Disposition: Discharge disposition:  03-[facility] [place] for discharge to SNF when bed is available and insurance approval has been obtained.   Follow-up Information     Lattie Corns, PA-C Follow up in 14 day(s).   Specialty: Physician Assistant Why: Electa Sniff information: [place] [id number] [phone]  Signed: Judson Roch PA-C 10/27/2022, 7:54 AM

## 2022-10-26 NOTE — Progress Notes (Signed)
Physical Therapy Treatment Patient Details Name: [name] MRN: [id number] DOB: May 11, 1951 Today's Date: 10/26/2022   History of Present Illness [name] is a 71 y.o. female who presents for follow-up of her right knee pain secondary to advanced degenerative joint disease. The patient was last seen for these symptoms in April, 2023, and is scheduled for a right total knee arthroplasty on 10/24/2022. Pt is now S/P Right TKA using all-cemented Biomet Vanguard system with a 67.5 mm mm PCR femur, a 71 mm tibial tray with a 10 mm anterior stabilized E-poly insert, and a 34 x 7.8 mm all-poly 3-pegged domed patella. WBAT to RLE    PT Comments    Pt was long sitting in bed upon arriving. She is A and O x 4. Agrees to session and is cooperative throughout. More pain today than yesterday however did not limit session progression. Rated it 4/10. Overall pt is progressing well. She was able to exit bed, stand, and ambulate with a little assistance. Will benefit from short stay at SNF to maximize independence with ADLs. Pt will not have assistance at home. Currently still requiring assistance and cueing for safety with transfers, bed mobility, and gait training.    Recommendations for follow up therapy are one component of a multi-disciplinary discharge planning process, led by the attending physician.  Recommendations may be updated based on patient status, additional functional criteria and insurance authorization.  Follow Up Recommendations  Skilled nursing-short term rehab (<3 hours/day)     Assistance Recommended at Discharge Intermittent Supervision/Assistance  Patient can return home with the following A little help with walking and/or transfers;A little help with bathing/dressing/bathroom;Assistance with cooking/housework;Assistance with feeding;Direct supervision/assist for medications management;Direct supervision/assist for financial management;Assist for transportation;Help with stairs or  ramp for entrance   Equipment Recommendations  None recommended by PT       Precautions / Restrictions Precautions Precautions: Fall;Knee Restrictions Weight Bearing Restrictions: Yes RLE Weight Bearing: Weight bearing as tolerated     Mobility  Bed Mobility Overal bed mobility: Needs Assistance Bed Mobility: Supine to Sit  Supine to sit: Min assist  General bed mobility comments: Increased assistance required due to pain    Transfers Overall transfer level: Needs assistance Equipment used: Rolling walker (2 wheels) Transfers: Sit to/from Stand Sit to Stand: Min assist  General transfer comment: Min assist to stand from EOB    Ambulation/Gait Ambulation/Gait assistance: Min guard Gait Distance (Feet): 40 Feet Assistive device: Rolling walker (2 wheels) Gait Pattern/deviations: Step-through pattern Gait velocity: decreased  General Gait Details: Pt was able to ambulate 40 ft   Balance Overall balance assessment: Needs assistance Sitting-balance support: Feet supported Sitting balance-Leahy Scale: Good     Standing balance support: Bilateral upper extremity supported Standing balance-Leahy Scale: Fair    Cognition Arousal/Alertness: Awake/alert Behavior During Therapy: WFL for tasks assessed/performed Overall Cognitive Status: Within Functional Limits for tasks assessed    General Comments: Pt is A and O x 4        Exercises Total Joint Exercises Ankle Circles/Pumps: AROM, 10 reps, Seated Quad Sets: AROM, 10 reps Heel Slides: AROM, 10 reps Hip ABduction/ADduction: AROM, 10 reps Straight Leg Raises: AROM, 10 reps Long Arc Quad: AROM, 10 reps    General Comments General comments (skin integrity, edema, etc.): Pt was able to perform HEP and stretching with minimal assistance.      Pertinent Vitals/Pain Pain Assessment Pain Assessment: 0-10 Pain Score: 4  Pain Location: R knee Pain Descriptors / Indicators: Discomfort Pain  Intervention(s): Limited  activity within patient's tolerance, Monitored during session, Premedicated before session, Repositioned     PT Goals (current goals can now be found in the care plan section) Acute Rehab PT Goals Patient Stated Goal: rehab then home Progress towards PT goals: Progressing toward goals    Frequency    BID      PT Plan Current plan remains appropriate       AM-PAC PT "6 Clicks" Mobility   Outcome Measure  Help needed turning from your back to your side while in a flat bed without using bedrails?: A Little Help needed moving from lying on your back to sitting on the side of a flat bed without using bedrails?: A Little Help needed moving to and from a bed to a chair (including a wheelchair)?: A Little Help needed standing up from a chair using your arms (e.g., wheelchair or bedside chair)?: A Little Help needed to walk in hospital room?: A Little Help needed climbing 3-5 steps with a railing? : A Lot 6 Click Score: 17    End of Session   Activity Tolerance: Patient tolerated treatment well Patient left: in chair;with call bell/phone within reach;with chair alarm set Nurse Communication: Mobility status PT Visit Diagnosis: Unsteadiness on feet (R26.81)     Time: 9485-4627 PT Time Calculation (min) (ACUTE ONLY): 25 min  Charges:  $Gait Training: 8-22 mins $Therapeutic Exercise: 8-22 mins                     Julaine Fusi PTA 10/26/22, 9:04 AM

## 2022-10-26 NOTE — TOC Progression Note (Signed)
Spoke with patient at bedside. Patient advised bed search had been initiated and there are no bed offers at this time. She was informed the referrals had been sent to [facility] and [facility], [place]. Patient advised after bed offer is given, insurance auth would need to be obtained.

## 2022-10-27 DIAGNOSIS — M1711 Unilateral primary osteoarthritis, right knee: Secondary | ICD-10-CM | POA: Diagnosis not present

## 2022-10-27 NOTE — Progress Notes (Signed)
ESM transported pt from [facility] to [facility].

## 2022-10-27 NOTE — Progress Notes (Signed)
Physical Therapy Treatment Patient Details Name: [name] MRN: [id number] DOB: 11-16-1951 Today's Date: 10/27/2022   History of Present Illness [name] is a 71 y.o. female who presents for follow-up of her right knee pain secondary to advanced degenerative joint disease. The patient was last seen for these symptoms in April, 2023, and is scheduled for a right total knee arthroplasty on 10/24/2022. Pt is now S/P Right TKA using all-cemented Biomet Vanguard system with a 67.5 mm mm PCR femur, a 71 mm tibial tray with a 10 mm anterior stabilized E-poly insert, and a 34 x 7.8 mm all-poly 3-pegged domed patella. WBAT to RLE    PT Comments    Pt was long sitting in bed upon arriving. Agrees to session and remains cooperative and motivated throughout. Does endorse 6/10 pain in wt bearing. Pt was able to exit bed, stand, and ambulate with RW ~ 21ft. Slow antalgic step throughout gait. BLE toe out noted but not unusual for her. Returned to room and perform ROM. Pt still lacking ~ 4 degrees extension but did increased flexion to ~88 degrees. She demonstrated HEP well with minimal cueing. Overall pt continues to progress towards all PT goals. SNF most appropriate due to limited assistance available at DC. Will continue to follow per current POC.   Recommendations for follow up therapy are one component of a multi-disciplinary discharge planning process, led by the attending physician.  Recommendations may be updated based on patient status, additional functional criteria and insurance authorization.  Follow Up Recommendations  Skilled nursing-short term rehab (<3 hours/day)     Assistance Recommended at Discharge Intermittent Supervision/Assistance  Patient can return home with the following A little help with walking and/or transfers;A little help with bathing/dressing/bathroom;Assistance with cooking/housework;Assistance with feeding;Direct supervision/assist for medications management;Direct  supervision/assist for financial management;Assist for transportation;Help with stairs or ramp for entrance   Equipment Recommendations  None recommended by PT    Recommendations for Other Services       Precautions / Restrictions Precautions Precautions: Fall;Knee Precaution Booklet Issued: Yes (comment) Restrictions Weight Bearing Restrictions: Yes RLE Weight Bearing: Weight bearing as tolerated     Mobility  Bed Mobility Overal bed mobility: Needs Assistance Bed Mobility: Supine to Sit  Supine to sit: Min assist   Transfers Overall transfer level: Needs assistance Equipment used: Rolling walker (2 wheels) Transfers: Sit to/from Stand Sit to Stand: Min assist   Ambulation/Gait Ambulation/Gait assistance: Supervision Gait Distance (Feet): 75 Feet Assistive device: Rolling walker (2 wheels) Gait Pattern/deviations: Step-through pattern, Antalgic Gait velocity: decreased     General Gait Details: pt tolerated ambulation ~ 29ft with antalgic step through gait pattern. BLE toe out noted but this is pt's baseline. Overall tolerated ambulation well.     Balance Overall balance assessment: Needs assistance Sitting-balance support: Feet supported Sitting balance-Leahy Scale: Good     Standing balance support: Bilateral upper extremity supported Standing balance-Leahy Scale: Good       Cognition Arousal/Alertness: Awake/alert Behavior During Therapy: WFL for tasks assessed/performed Overall Cognitive Status: Within Functional Limits for tasks assessed      General Comments: Pt is A and O x 4        Exercises Total Joint Exercises Ankle Circles/Pumps: AROM, 10 reps, Seated Quad Sets: AROM, 10 reps Heel Slides: Seated, 5 reps, AAROM Goniometric ROM: 4-88 degrees        Pertinent Vitals/Pain Pain Assessment Pain Assessment: 0-10 Pain Score: 6  Pain Location: R knee Pain Descriptors / Indicators: Discomfort Pain Intervention(s):  Limited activity within  patient's tolerance, Monitored during session, Premedicated before session, Repositioned, Ice applied     PT Goals (current goals can now be found in the care plan section) Acute Rehab PT Goals Patient Stated Goal: rehab then home Progress towards PT goals: Progressing toward goals    Frequency    BID      PT Plan Current plan remains appropriate       AM-PAC PT "6 Clicks" Mobility   Outcome Measure  Help needed turning from your back to your side while in a flat bed without using bedrails?: A Little Help needed moving from lying on your back to sitting on the side of a flat bed without using bedrails?: A Little Help needed moving to and from a bed to a chair (including a wheelchair)?: A Little Help needed standing up from a chair using your arms (e.g., wheelchair or bedside chair)?: A Little Help needed to walk in hospital room?: A Little Help needed climbing 3-5 steps with a railing? : A Little 6 Click Score: 18    End of Session Equipment Utilized During Treatment: Gait belt Activity Tolerance: Patient tolerated treatment well Patient left: in chair;with call bell/phone within reach;with chair alarm set Nurse Communication: Mobility status PT Visit Diagnosis: Unsteadiness on feet (R26.81)     Time: 9147-8295 PT Time Calculation (min) (ACUTE ONLY): 28 min  Charges:  $Gait Training: 8-22 mins $Therapeutic Exercise: 8-22 mins                    Julaine Fusi PTA 10/27/22, 9:11 AM

## 2022-10-27 NOTE — Progress Notes (Signed)
Subjective: 3 Days Post-Op Procedure(s) (LRB): TOTAL KNEE ARTHROPLASTY - RNFA (Right) Patient reports pain as mild.  She is working well with PT this morning. Patient is well, and has had no acute complaints or problems Plan is to go Skilled nursing facility after hospital stay. Bed has been accepted, waiting on final insurance auth. Negative for chest pain and shortness of breath Fever: no Gastrointestinal:Negative for nausea and vomiting Patient reports that she is passing gas this morning.  She has had a BM.  Objective: Vital signs in last 24 hours: Temp:  [97.9 F (36.6 C)-98.3 F (36.8 C)] 98.3 F (36.8 C) (11/10 0125) Pulse Rate:  [69-71] 69 (11/10 0125) Resp:  [16-18] 18 (11/10 0125) BP: (124-135)/(59-66) 124/59 (11/10 0125) SpO2:  [99 %-100 %] 100 % (11/10 0125)  Intake/Output from previous day:  Intake/Output Summary (Last 24 hours) at 10/27/2022 0751 Last data filed at 10/26/2022 1800 Gross per 24 hour  Intake --  Output 1 ml  Net -1 ml    Intake/Output this shift: No intake/output data recorded.  Labs: Recent Labs    10/25/22 0543 10/26/22 0341  HGB 10.4* 9.5*   Recent Labs    10/25/22 0543 10/26/22 0341  WBC 8.6 9.1  RBC 3.78* 3.48*  HCT 33.2* 30.7*  PLT 182 226   Recent Labs    10/25/22 0543  NA 140  K 4.0  CL 109  CO2 23  BUN 23  CREATININE 0.96  GLUCOSE 153*  CALCIUM 8.2*   No results for input(s): "LABPT", "INR" in the last 72 hours.   EXAM General - Patient is Alert, Appropriate, and Oriented Extremity - ABD soft Neurovascular intact Intact pulses distally Dorsiflexion/Plantar flexion intact Incision: moderate drainage No cellulitis present Compartment soft Dressing/Incision - Woundvac intact to the right knee, some drainage noted in the sponge but no drainage into the cannister at this time. Motor Function - intact, moving foot and toes well on exam.  Abdomen soft to palpation.  Intact bowel sounds.  Past Medical  History:  Diagnosis Date   Anemia    Anesthesia complication    a.) (+) delayed emergence   Anxiety    Asthma    Atrial fibrillation ([place])    a.) CHA2DS2VASc = 3 (age, sex, HTN);  b.) rate/rhythm maintained without pharmacological interventions; chronically anticoagulated with apixaban   Cellulitis    CKD (chronic kidney disease), stage IV (HCC)    COPD (chronic obstructive pulmonary disease) (HCC)    GERD (gastroesophageal reflux disease)    History of MRSA infection    HLD (hyperlipidemia)    Hypertension    Incomplete right bundle branch block (RBBB)    Long term (current) use of anticoagulants    a.) Apixaban   Long term current use of immunosuppressive drug    a.) MTX + hydroxychloroquine for RA   Lymphedema    Mild protein-calorie malnutrition (HCC)    Multinodular thyroid    Osteoarthritis    Rheumatoid arthritis (HCC)     Assessment/Plan: 3 Days Post-Op Procedure(s) (LRB): TOTAL KNEE ARTHROPLASTY - RNFA (Right) Principal Problem:   Status post total knee replacement using cement, right  Estimated body mass index is 36.44 kg/m as calculated from the following:   Height as of this encounter: 5\' 5"  (1.651 m).   Weight as of this encounter: 99.3 kg. Advance diet Up with therapy D/C IV fluids when tolerating po intake.  Vitals reviewed this AM. Woundvac intact without significant drainage. Up with therapy today.  Plan  for discharge to SNF Bed has been accepted, waiting on insurance auth. Patient has had a BM. Plan for discharge to SNF possibly today pending insurance auth.  DVT Prophylaxis - TED hose and Eliquis Weight-Bearing as tolerated to right leg  J. Cameron Proud, PA-C [facility] 10/27/2022, 7:51 AM

## 2022-10-27 NOTE — TOC Progression Note (Signed)
Transition of Care [facility]) - Progression Note    Patient Details  Name: [name] MRN: [id number] Date of Birth: 1951/04/17  Transition of Care [facility]) CM/SW [address], RN Phone Number: 10/27/2022, 11:31 AM  Clinical Narrative:     The patient notified me that she is not able to get transportation to SNF I explained that EMS is not guaranteed to be paid for by Ins, she stated understanding and stated that she will still need EMS  Expected Discharge Plan: Skilled Nursing Facility Barriers to Discharge: Barriers Resolved  Expected Discharge Plan and Services Expected Discharge Plan: [place]         Expected Discharge Date: 10/27/22                                     Social Determinants of Health (SDOH) Interventions    Readmission Risk Interventions     No data to display

## 2022-10-27 NOTE — TOC Progression Note (Signed)
Transition of Care [facility]) - Progression Note    Patient Details  Name: [name] MRN: [id number] Date of Birth: Apr 20, 1951  Transition of Care [facility]) CM/SW [place], RN Phone Number: 10/27/2022, 11:36 AM  Clinical Narrative:     Patient going to [facility] She is aware and she notified her daughter  3rd on list  Expected Discharge Plan: [place] Barriers to Discharge: Barriers Resolved  Expected Discharge Plan and Services Expected Discharge Plan: [place]         Expected Discharge Date: 10/27/22                                     Social Determinants of Health (SDOH) Interventions    Readmission Risk Interventions     No data to display

## 2022-10-27 NOTE — Plan of Care (Signed)
  Problem: Education: Goal: Knowledge of General Education information will improve Description: Including pain rating scale, medication(s)/side effects and non-pharmacologic comfort measures Outcome: Adequate for Discharge   Problem: Health Behavior/Discharge Planning: Goal: Ability to manage health-related needs will improve Outcome: Adequate for Discharge   Problem: Clinical Measurements: Goal: Ability to maintain clinical measurements within normal limits will improve Outcome: Adequate for Discharge Goal: Will remain free from infection Outcome: Adequate for Discharge Goal: Diagnostic test results will improve Outcome: Adequate for Discharge Goal: Respiratory complications will improve Outcome: Adequate for Discharge Goal: Cardiovascular complication will be avoided Outcome: Adequate for Discharge   Problem: Activity: Goal: Risk for activity intolerance will decrease Outcome: Adequate for Discharge   Problem: Nutrition: Goal: Adequate nutrition will be maintained Outcome: Adequate for Discharge   Problem: Coping: Goal: Level of anxiety will decrease Outcome: Adequate for Discharge   Problem: Elimination: Goal: Will not experience complications related to bowel motility Outcome: Adequate for Discharge Goal: Will not experience complications related to urinary retention Outcome: Adequate for Discharge   Problem: Pain Managment: Goal: General experience of comfort will improve Outcome: Adequate for Discharge   Problem: Safety: Goal: Ability to remain free from injury will improve Outcome: Adequate for Discharge   Problem: Skin Integrity: Goal: Risk for impaired skin integrity will decrease Outcome: Adequate for Discharge   Problem: Acute Rehab PT Goals(only PT should resolve) Goal: Patient Will Transfer Sit To/From Stand Outcome: Adequate for Discharge Goal: Pt Will Transfer Bed To Chair/Chair To Bed Outcome: Adequate for Discharge Goal: Pt Will  Ambulate Outcome: Adequate for Discharge   Problem: Acute Rehab PT Goals(only PT should resolve) Goal: PT Additional Goal #1 Outcome: Adequate for Discharge

## 2022-10-27 NOTE — TOC Progression Note (Signed)
Transition of Care [facility]) - Progression Note    Patient Details  Name: [name] MRN: [id number] Date of Birth: Apr 11, 1951  Transition of Care [facility]) CM/SW [place], RN Phone Number: 10/27/2022, 11:16 AM  Clinical Narrative:     Ins approved [id number] Going to [facility] She will go to room 604, bedside nurse to call report to [phone], she is going to check with her daughter to see if she can transport her, I explained Ins may not cover EMS since she is walking 75 feet She will let us know if her daughter will transport        Expected Discharge Plan and Services           Expected Discharge Date: 10/27/22                                     Social Determinants of Health (SDOH) Interventions    Readmission Risk Interventions     No data to display

## 2022-10-27 NOTE — TOC Progression Note (Signed)
Transition of Care [facility]) - Progression Note    Patient Details  Name: [name] MRN: [id number] Date of Birth: 1951/05/31  Transition of Care [facility]) CM/SW [place], RN Phone Number: 10/27/2022, 8:38 AM  Clinical Narrative:     [facility], it is pending, [id number]       Expected Discharge Plan and Services           Expected Discharge Date: 10/27/22                                     Social Determinants of Health (SDOH) Interventions    Readmission Risk Interventions     No data to display

## 2022-10-27 NOTE — Progress Notes (Signed)
Contacted [facility] and reported to nurse Debbie.

## 2022-11-06 ENCOUNTER — Other Ambulatory Visit
Admission: RE | Admit: 2022-11-06 | Discharge: 2022-11-06 | Disposition: A | Discharge status: Home / Self Care | Payer: Medicare Other | Source: Ambulatory Visit | Attending: Student | Admitting: Student

## 2022-11-06 DIAGNOSIS — M25461 Effusion, right knee: Secondary | ICD-10-CM | POA: Insufficient documentation

## 2022-11-06 DIAGNOSIS — L03115 Cellulitis of right lower limb: Secondary | ICD-10-CM | POA: Diagnosis present

## 2022-11-06 DIAGNOSIS — Z96651 Presence of right artificial knee joint: Secondary | ICD-10-CM | POA: Diagnosis present

## 2022-11-06 LAB — SYNOVIAL CELL COUNT + DIFF, W/ CRYSTALS
Crystals, Fluid: NONE SEEN
Eosinophils-Synovial: 0 %
Lymphocytes-Synovial Fld: 3 %
Monocyte-Macrophage-Synovial Fluid: 6 %
Neutrophil, Synovial: 91 %
WBC, Synovial: 1550 /mm3 — ABNORMAL HIGH (ref 0–200)

## 2023-04-18 ENCOUNTER — Ambulatory Visit: Payer: Medicare Other | Admitting: Internal Medicine

## 2023-06-19 ENCOUNTER — Other Ambulatory Visit
Admission: RE | Admit: 2023-06-19 | Discharge: 2023-06-19 | Disposition: A | Discharge status: Home / Self Care | Payer: Medicare Other | Source: Ambulatory Visit | Attending: Nurse Practitioner | Admitting: Nurse Practitioner

## 2023-06-19 DIAGNOSIS — I10 Essential (primary) hypertension: Secondary | ICD-10-CM | POA: Diagnosis not present

## 2023-06-19 DIAGNOSIS — R635 Abnormal weight gain: Secondary | ICD-10-CM | POA: Diagnosis present

## 2023-06-19 DIAGNOSIS — I4819 Other persistent atrial fibrillation: Secondary | ICD-10-CM | POA: Diagnosis not present

## 2023-06-19 DIAGNOSIS — R531 Weakness: Secondary | ICD-10-CM | POA: Insufficient documentation

## 2023-06-19 DIAGNOSIS — R12 Heartburn: Secondary | ICD-10-CM | POA: Diagnosis not present

## 2023-06-19 DIAGNOSIS — R5383 Other fatigue: Secondary | ICD-10-CM | POA: Insufficient documentation

## 2023-06-19 LAB — BRAIN NATRIURETIC PEPTIDE: B Natriuretic Peptide: 242 pg/mL — ABNORMAL HIGH (ref 0.0–100.0)

## 2023-08-07 ENCOUNTER — Other Ambulatory Visit: Payer: Self-pay | Admitting: Orthopedic Surgery

## 2023-08-07 DIAGNOSIS — M19011 Primary osteoarthritis, right shoulder: Secondary | ICD-10-CM

## 2023-08-15 ENCOUNTER — Ambulatory Visit
Admission: RE | Admit: 2023-08-15 | Discharge: 2023-08-15 | Disposition: A | Discharge status: Home / Self Care | Payer: Medicare Other | Source: Ambulatory Visit | Attending: Orthopedic Surgery | Admitting: Orthopedic Surgery

## 2023-08-15 DIAGNOSIS — M19011 Primary osteoarthritis, right shoulder: Secondary | ICD-10-CM | POA: Insufficient documentation

## 2023-09-20 ENCOUNTER — Other Ambulatory Visit: Payer: Self-pay | Admitting: Family Medicine

## 2023-09-20 DIAGNOSIS — M5412 Radiculopathy, cervical region: Secondary | ICD-10-CM

## 2023-10-09 ENCOUNTER — Other Ambulatory Visit: Payer: Medicare Other

## 2023-10-24 ENCOUNTER — Ambulatory Visit
Admission: RE | Admit: 2023-10-24 | Discharge: 2023-10-24 | Disposition: A | Discharge status: Home / Self Care | Payer: Medicare Other | Source: Ambulatory Visit | Attending: Family Medicine | Admitting: Family Medicine

## 2023-10-24 DIAGNOSIS — M5412 Radiculopathy, cervical region: Secondary | ICD-10-CM

## 2024-01-28 ENCOUNTER — Observation Stay
Admission: EM | Admit: 2024-01-28 | Discharge: 2024-02-01 | Disposition: A | Discharge status: Home / Self Care | Payer: Medicare Other | Attending: Obstetrics and Gynecology | Admitting: Obstetrics and Gynecology

## 2024-01-28 ENCOUNTER — Emergency Department (HOSPITAL_COMMUNITY): Payer: Medicare Other

## 2024-01-28 ENCOUNTER — Other Ambulatory Visit: Payer: Self-pay

## 2024-01-28 ENCOUNTER — Emergency Department: Payer: Medicare Other

## 2024-01-28 DIAGNOSIS — J45901 Unspecified asthma with (acute) exacerbation: Secondary | ICD-10-CM | POA: Diagnosis not present

## 2024-01-28 DIAGNOSIS — K219 Gastro-esophageal reflux disease without esophagitis: Secondary | ICD-10-CM | POA: Diagnosis present

## 2024-01-28 DIAGNOSIS — I129 Hypertensive chronic kidney disease with stage 1 through stage 4 chronic kidney disease, or unspecified chronic kidney disease: Secondary | ICD-10-CM | POA: Diagnosis not present

## 2024-01-28 DIAGNOSIS — Z79899 Other long term (current) drug therapy: Secondary | ICD-10-CM | POA: Diagnosis not present

## 2024-01-28 DIAGNOSIS — I480 Paroxysmal atrial fibrillation: Secondary | ICD-10-CM | POA: Diagnosis not present

## 2024-01-28 DIAGNOSIS — J441 Chronic obstructive pulmonary disease with (acute) exacerbation: Principal | ICD-10-CM | POA: Insufficient documentation

## 2024-01-28 DIAGNOSIS — N184 Chronic kidney disease, stage 4 (severe): Secondary | ICD-10-CM | POA: Diagnosis not present

## 2024-01-28 DIAGNOSIS — J4489 Other specified chronic obstructive pulmonary disease: Secondary | ICD-10-CM | POA: Insufficient documentation

## 2024-01-28 DIAGNOSIS — Z96653 Presence of artificial knee joint, bilateral: Secondary | ICD-10-CM | POA: Insufficient documentation

## 2024-01-28 DIAGNOSIS — Z7901 Long term (current) use of anticoagulants: Secondary | ICD-10-CM | POA: Insufficient documentation

## 2024-01-28 DIAGNOSIS — E876 Hypokalemia: Secondary | ICD-10-CM | POA: Diagnosis not present

## 2024-01-28 DIAGNOSIS — E785 Hyperlipidemia, unspecified: Secondary | ICD-10-CM | POA: Diagnosis not present

## 2024-01-28 DIAGNOSIS — I4891 Unspecified atrial fibrillation: Secondary | ICD-10-CM | POA: Diagnosis present

## 2024-01-28 DIAGNOSIS — J211 Acute bronchiolitis due to human metapneumovirus: Principal | ICD-10-CM | POA: Insufficient documentation

## 2024-01-28 DIAGNOSIS — E870 Hyperosmolality and hypernatremia: Secondary | ICD-10-CM | POA: Diagnosis not present

## 2024-01-28 DIAGNOSIS — B9781 Human metapneumovirus as the cause of diseases classified elsewhere: Secondary | ICD-10-CM

## 2024-01-28 DIAGNOSIS — J208 Acute bronchitis due to other specified organisms: Secondary | ICD-10-CM

## 2024-01-28 DIAGNOSIS — J45909 Unspecified asthma, uncomplicated: Secondary | ICD-10-CM | POA: Diagnosis present

## 2024-01-28 DIAGNOSIS — R0602 Shortness of breath: Secondary | ICD-10-CM | POA: Diagnosis present

## 2024-01-28 DIAGNOSIS — E669 Obesity, unspecified: Secondary | ICD-10-CM | POA: Insufficient documentation

## 2024-01-28 DIAGNOSIS — I10 Essential (primary) hypertension: Secondary | ICD-10-CM | POA: Diagnosis present

## 2024-01-28 DIAGNOSIS — M069 Rheumatoid arthritis, unspecified: Secondary | ICD-10-CM | POA: Diagnosis not present

## 2024-01-28 LAB — RESPIRATORY PANEL BY PCR

## 2024-01-28 LAB — CBC WITH DIFFERENTIAL/PLATELET
Abs Immature Granulocytes: 0.02 10*3/uL (ref 0.00–0.07)
Basophils Absolute: 0 10*3/uL (ref 0.0–0.1)
Basophils Relative: 0 %
Eosinophils Absolute: 0.1 10*3/uL (ref 0.0–0.5)
Eosinophils Relative: 2 %
HCT: 43.8 % (ref 36.0–46.0)
Hemoglobin: 14.1 g/dL (ref 12.0–15.0)
Immature Granulocytes: 0 %
Lymphocytes Relative: 12 %
Lymphs Abs: 0.6 10*3/uL — ABNORMAL LOW (ref 0.7–4.0)
MCH: 31.9 pg (ref 26.0–34.0)
MCHC: 32.2 g/dL (ref 30.0–36.0)
MCV: 99.1 fL (ref 80.0–100.0)
Monocytes Absolute: 0.4 10*3/uL (ref 0.1–1.0)
Monocytes Relative: 8 %
Neutro Abs: 3.8 10*3/uL (ref 1.7–7.7)
Neutrophils Relative %: 78 %
Platelets: 149 10*3/uL — ABNORMAL LOW (ref 150–400)
RBC: 4.42 MIL/uL (ref 3.87–5.11)
RDW: 14.9 % (ref 11.5–15.5)
WBC: 4.9 10*3/uL (ref 4.0–10.5)
nRBC: 0 % (ref 0.0–0.2)

## 2024-01-28 LAB — BASIC METABOLIC PANEL
Anion gap: 12 (ref 5–15)
BUN: 19 mg/dL (ref 8–23)
CO2: 28 mmol/L (ref 22–32)
Calcium: 8.7 mg/dL — ABNORMAL LOW (ref 8.9–10.3)
Chloride: 107 mmol/L (ref 98–111)
Creatinine, Ser: 1 mg/dL (ref 0.44–1.00)
GFR, Estimated: >60 mL/min (ref >60)
Glucose, Bld: 90 mg/dL (ref 70–99)
Potassium: 3.4 mmol/L — ABNORMAL LOW (ref 3.5–5.1)
Sodium: 147 mmol/L — ABNORMAL HIGH (ref 135–145)

## 2024-01-28 LAB — PROCALCITONIN: Procalcitonin: <0.1 ng/mL

## 2024-01-28 MED ORDER — DOXYCYCLINE HYCLATE 100 MG PO TABS
100.0000 mg | ORAL_TABLET | Freq: Two times a day (BID) | ORAL | Status: DC
  Administered 2024-01-28 – 2024-01-29 (×2): 100 mg via ORAL
  Filled 2024-01-28 (×2): qty 1

## 2024-01-28 MED ORDER — METHYLPREDNISOLONE SODIUM SUCC 40 MG IJ SOLR
40.0000 mg | Freq: Two times a day (BID) | INTRAMUSCULAR | Status: DC
  Administered 2024-01-28 – 2024-02-01 (×8): 40 mg via INTRAVENOUS
  Filled 2024-01-28 (×8): qty 1

## 2024-01-28 MED ORDER — POTASSIUM CHLORIDE CRYS ER 20 MEQ PO TBCR
40.0000 meq | EXTENDED_RELEASE_TABLET | Freq: Once | ORAL | Status: AC
  Administered 2024-01-28: 40 meq via ORAL
  Filled 2024-01-28: qty 2

## 2024-01-28 MED ORDER — ATORVASTATIN CALCIUM 10 MG PO TABS
5.0000 mg | ORAL_TABLET | Freq: Every day | ORAL | Status: DC
  Administered 2024-01-29 – 2024-01-31 (×3): 5 mg via ORAL
  Filled 2024-01-28 (×3): qty 1

## 2024-01-28 MED ORDER — METHYLPREDNISOLONE SODIUM SUCC 125 MG IJ SOLR
125.0000 mg | Freq: Once | INTRAMUSCULAR | Status: AC
  Administered 2024-01-28: 125 mg via INTRAVENOUS
  Filled 2024-01-28: qty 2

## 2024-01-28 MED ORDER — HYDROXYCHLOROQUINE SULFATE 200 MG PO TABS
200.0000 mg | ORAL_TABLET | Freq: Two times a day (BID) | ORAL | Status: DC
  Administered 2024-01-28 – 2024-02-01 (×8): 200 mg via ORAL
  Filled 2024-01-28 (×9): qty 1

## 2024-01-28 MED ORDER — IPRATROPIUM-ALBUTEROL 0.5-2.5 (3) MG/3ML IN SOLN
3.0000 mL | Freq: Once | RESPIRATORY_TRACT | Status: AC
  Administered 2024-01-28: 3 mL via RESPIRATORY_TRACT
  Filled 2024-01-28: qty 3

## 2024-01-28 MED ORDER — ACETAMINOPHEN 500 MG PO TABS
500.0000 mg | ORAL_TABLET | Freq: Four times a day (QID) | ORAL | Status: DC | PRN
  Administered 2024-01-29: 500 mg via ORAL
  Filled 2024-01-28: qty 1

## 2024-01-28 MED ORDER — POTASSIUM CHLORIDE ER 10 MEQ PO TBCR: 10.0000 meq | EXTENDED_RELEASE_TABLET | Freq: Every day | ORAL | Status: DC

## 2024-01-28 MED ORDER — APIXABAN 5 MG PO TABS
5.0000 mg | ORAL_TABLET | Freq: Two times a day (BID) | ORAL | Status: DC
  Administered 2024-01-28 – 2024-02-01 (×8): 5 mg via ORAL
  Filled 2024-01-28 (×8): qty 1

## 2024-01-28 MED ORDER — LIDOCAINE 5 % EX PTCH
1.0000 | MEDICATED_PATCH | CUTANEOUS | Status: DC
  Administered 2024-01-28 – 2024-01-31 (×4): 1 via TRANSDERMAL
  Filled 2024-01-28 (×4): qty 1

## 2024-01-28 MED ORDER — ALBUTEROL SULFATE (2.5 MG/3ML) 0.083% IN NEBU: 3.0000 mL | INHALATION_SOLUTION | Freq: Four times a day (QID) | RESPIRATORY_TRACT | Status: DC | PRN

## 2024-01-28 MED ORDER — PAROXETINE HCL 30 MG PO TABS: 30.0000 mg | ORAL_TABLET | Freq: Every day | ORAL | Status: DC

## 2024-01-28 MED ORDER — METHOTREXATE SODIUM 2.5 MG PO TABS: 2.5000 mg | ORAL_TABLET | ORAL | Status: DC

## 2024-01-28 MED ORDER — MOMETASONE FUROATE 0.1 % EX OINT: TOPICAL_OINTMENT | Freq: Every day | CUTANEOUS | Status: DC

## 2024-01-28 MED ORDER — OXYCODONE HCL 5 MG PO TABS
5.0000 mg | ORAL_TABLET | ORAL | Status: DC | PRN
  Filled 2024-01-28: qty 1

## 2024-01-28 MED ORDER — GUAIFENESIN ER 600 MG PO TB12
1200.0000 mg | ORAL_TABLET | Freq: Two times a day (BID) | ORAL | Status: DC
  Administered 2024-01-28 – 2024-02-01 (×8): 1200 mg via ORAL
  Filled 2024-01-28 (×8): qty 2

## 2024-01-28 MED ORDER — AMLODIPINE BESYLATE 10 MG PO TABS
10.0000 mg | ORAL_TABLET | Freq: Every day | ORAL | Status: DC
Start: 2024-01-29 — End: 2024-02-01
  Administered 2024-01-29 – 2024-02-01 (×4): 10 mg via ORAL
  Filled 2024-01-28 (×4): qty 1

## 2024-01-28 MED ORDER — ONDANSETRON HCL 4 MG PO TABS
4.0000 mg | ORAL_TABLET | Freq: Four times a day (QID) | ORAL | Status: DC | PRN
Start: 2024-01-28 — End: 2024-02-01

## 2024-01-28 MED ORDER — HYDROXYZINE HCL 25 MG PO TABS
25.0000 mg | ORAL_TABLET | Freq: Every day | ORAL | Status: DC
Start: 2024-01-28 — End: 2024-01-29

## 2024-01-28 MED ORDER — LORATADINE 10 MG PO TABS
10.0000 mg | ORAL_TABLET | Freq: Every day | ORAL | Status: DC
  Administered 2024-01-29 – 2024-02-01 (×4): 10 mg via ORAL
  Filled 2024-01-28 (×4): qty 1

## 2024-01-28 MED ORDER — BENZONATATE 100 MG PO CAPS
100.0000 mg | ORAL_CAPSULE | Freq: Three times a day (TID) | ORAL | Status: DC | PRN
  Administered 2024-01-29 – 2024-02-01 (×6): 100 mg via ORAL
  Filled 2024-01-28 (×7): qty 1

## 2024-01-28 MED ORDER — MOMETASONE FURO-FORMOTEROL FUM 100-5 MCG/ACT IN AERO: 2.0000 | INHALATION_SPRAY | Freq: Two times a day (BID) | RESPIRATORY_TRACT | Status: DC

## 2024-01-28 NOTE — ED Provider Triage Note (Signed)
 Emergency Medicine Provider Triage Evaluation Note  [name] , a 73 y.o. female  was evaluated in triage.  Pt complains of cough and congestion. She was seen on Friday and diagnosed with bronchitis, being treated with prednisone  and doxycycline , patient reports her symptoms are not improving. Also has been using home inhaler without relief.  Review of Systems  Positive: Cough, congestion, body aches, fever Negative:   Physical Exam  BP (!) 148/81 (BP Location: Right Arm)   Pulse 79   Temp 98.1 F (36.7 C) (Oral)   Resp 18   SpO2 95%  Gen:   Awake, no distress   Resp:  Normal effort  MSK:   Moves extremities without difficulty  Other:   Medical Decision Making  Medically screening exam initiated at 12:42 PM.  Appropriate orders placed.  [name] was informed that the remainder of the evaluation will be completed by another provider, this initial triage assessment does not replace that evaluation, and the importance of remaining in the ED until their evaluation is complete.     Phyliss Breen, PA-C 01/28/24 1245

## 2024-01-28 NOTE — ED Triage Notes (Signed)
 Pt to ED via [facility] from home. Pt reports feeling unwell x5 days. Pt seen on Friday and dx with bronchitis and not getting better and wants to be reevaluated. Pt placed on prednisone  and doxycycline . Pt reports no relief inhaler at home. Pt on Eliquis  for afib   98% on RA  HR 84 97.8

## 2024-01-28 NOTE — H&P (Signed)
 History and Physical    TYRESA [name] GNF:[id number] DOB: [birth year] DOA: 01/28/2024  PCP: Lorrie Rothman, MD (Confirm with patient/family/NH records and if not entered, this has to be entered at [facility] point of entry) Patient coming from: Home  I have personally briefly reviewed patient's old medical records in [facility]  Chief Complaint: Cough, wheezing, shortness of breath  HPI: [name] is a 73 y.o. female with medical history significant of asthma/COPD, PAF on Eliquis , HTN, HLD, GERD, rheumatoid arthritis on DMARDs, presented with persistent cough wheezing shortness of breath.  Patient started to have URI like symptoms about 5 days ago, symptoms including runny nose congestions sore throat, soon patient started develop dry cough and wheezing.  She denied any fever or chills but she attributed to that she has been taking Tylenol  every 8 hours for the poorly controlled RA joint pains in her knees.  3 days ago she went to urgent care, when she was diagnosed with acute bronchitis, patient was sent home with doxycycline  and prednisone .  So far patient has able to take only 2 doses of those medications but she continued to experience worsening of dry cough wheezing and exertional dyspnea.  Today she feels extremely shortness of breath even at rest.  Denies any chest pain, no nauseous vomiting no abdominal pain no diarrhea.  She is a never smoker  ED Course: Afebrile, not tachycardia borderline tachypneic blood pressure 148/80, O2 saturation 95% on room air.  Blood work showed sodium 147, potassium 3.4, WBC 4.9, hemoglobin 14.1.  Chest x-ray showed no acute infiltrates.  Patient was given Solu-Medrol  x 1 and DuoNebs x 3 but continued to have wheezing and dyspnea.  Review of Systems: As per HPI otherwise 14 point review of systems negative.    Past Medical History:  Diagnosis Date   Anemia    Anesthesia complication    a.) (+) delayed emergence   Anxiety    Asthma    Atrial  fibrillation (HCC)    a.) CHA2DS2VASc = 3 (age, sex, HTN);  b.) rate/rhythm maintained without pharmacological interventions; chronically anticoagulated with apixaban    Cellulitis    CKD (chronic kidney disease), stage IV (HCC)    COPD (chronic obstructive pulmonary disease) (HCC)    GERD (gastroesophageal reflux disease)    History of MRSA infection    HLD (hyperlipidemia)    Hypertension    Incomplete right bundle branch block (RBBB)    Long term (current) use of anticoagulants    a.) Apixaban    Long term current use of immunosuppressive drug    a.) MTX + hydroxychloroquine  for RA   Lymphedema    Mild protein-calorie malnutrition (HCC)    Multinodular thyroid     Osteoarthritis    Rheumatoid arthritis (HCC)     Past Surgical History:  Procedure Laterality Date   CESAREAN SECTION     CHOLECYSTECTOMY N/A 07/31/2006   FOOT SURGERY Left    MOUTH SURGERY     TOTAL KNEE ARTHROPLASTY Left 10/18/2021   Procedure: TOTAL KNEE ARTHROPLASTY;  Surgeon: Elner Hahn, MD;  Location: [facility] ORS;  Service: Orthopedics;  Laterality: Left;   TOTAL KNEE ARTHROPLASTY Right 10/24/2022   Procedure: TOTAL KNEE ARTHROPLASTY - RNFA;  Surgeon: Elner Hahn, MD;  Location: [facility] ORS;  Service: Orthopedics;  Laterality: Right;   WISDOM TOOTH EXTRACTION       reports that she has never smoked. She has never used smokeless tobacco. She reports that she does not drink alcohol and  does not use drugs.  Allergies  Allergen Reactions   Sulfa Antibiotics Hives and Itching   Leflunomide Itching   Amoxicillin-Pot Clavulanate Nausea Only   Valdecoxib Rash    Family History  Problem Relation Age of Onset   Diabetes Mother    Hypertension Mother    Hypertension Father      Prior to Admission medications   Medication Sig Start Date End Date Taking? Authorizing Provider  acetaminophen  (TYLENOL ) 500 MG tablet Take 1-2 tablets (500-1,000 mg total) by mouth every 6 (six) hours as needed for mild pain (pain  score 1-3 or temp > 100.5). 10/20/21   Rojelio Clement, PA-C  albuterol  (VENTOLIN  HFA) 108 (90 Base) MCG/ACT inhaler Inhale 2 puffs into the lungs every 6 (six) hours as needed for wheezing or shortness of breath. 12/31/18   [provider]  amLODipine  (NORVASC ) 10 MG tablet Take 10 mg by mouth daily. 03/15/20   [provider]  apixaban  (ELIQUIS ) 5 MG TABS tablet Take 5 mg by mouth 2 (two) times daily. 09/17/20   [provider]  atorvastatin  (LIPITOR) 10 MG tablet Take 5 mg by mouth at bedtime. 09/18/20   [provider]  budesonide-formoterol  (SYMBICORT) 80-4.5 MCG/ACT inhaler Inhale 2 puffs into the lungs 2 (two) times daily as needed (shortness of breath or wheezing). 05/18/20   [provider]  cyclobenzaprine  (FLEXERIL ) 5 MG tablet Take 1 tablet (5 mg total) by mouth 3 (three) times daily as needed for muscle spasms. Patient not taking: Reported on 10/24/2022 10/20/21   Rojelio Clement, PA-C  docusate sodium  (COLACE) 100 MG capsule Take 1 capsule (100 mg total) by mouth 2 (two) times daily. Patient not taking: Reported on 10/24/2022 10/20/21   Rojelio Clement, PA-C  ferrous sulfate  325 (65 FE) MG tablet Take 325 mg by mouth daily with breakfast. 07/07/21   [provider]  folic acid  (FOLVITE ) 1 MG tablet Take 1 mg by mouth daily.    [provider]  hydroxychloroquine  (PLAQUENIL ) 200 MG tablet Take 200 mg by mouth 2 (two) times daily. 08/09/21   [provider]  hydrOXYzine  (ATARAX ) 25 MG tablet Take 25 mg by mouth daily. At HS - 2 tablets    [provider]  loratadine  (CLARITIN ) 10 MG tablet Take 10 mg by mouth daily.    [provider]  methotrexate  (RHEUMATREX) 2.5 MG tablet Take 2.5 mg by mouth once a week. Caution:Chemotherapy. Protect from light. Take 6 tablets every 7 days on Thursday    [provider]  mometasone  (ELOCON ) 0.1 % ointment Apply topically daily.    [provider]   ondansetron  (ZOFRAN ) 4 MG tablet Take 1 tablet (4 mg total) by mouth every 6 (six) hours as needed for nausea. 10/25/22   Rojelio Clement, PA-C  oxyCODONE  (OXY IR/ROXICODONE ) 5 MG immediate release tablet Take 1-2 tablets (5-10 mg total) by mouth every 4 (four) hours as needed for severe pain. 10/25/22   Rojelio Clement, PA-C  PARoxetine  (PAXIL ) 30 MG tablet Take 30 mg by mouth daily. 07/29/20   [provider]  potassium chloride  (KLOR-CON ) 10 MEQ tablet Take 10 mEq by mouth daily. 07/30/20   [provider]  triamcinolone  (KENALOG ) 0.025 % cream Apply 1 Application topically daily. Patient not taking: Reported on 10/24/2022    [provider]  triamcinolone  cream (KENALOG ) 0.1 % Apply 1 Application topically 2 (two) times daily. Patient not taking: Reported on 10/24/2022    [provider]    Physical Exam: Vitals:   01/28/24 1239  BP: (!) 148/81  Pulse: 79  Resp: 18  Temp: 98.1 F (36.7 C)  TempSrc: Oral  SpO2: 95%    Constitutional: NAD, calm, comfortable Vitals:   01/28/24 1239  BP: (!) 148/81  Pulse: 79  Resp: 18  Temp: 98.1 F (36.7 C)  TempSrc: Oral  SpO2: 95%   Eyes: PERRL, lids and conjunctivae normal ENMT: Mucous membranes are moist. Posterior pharynx clear of any exudate or lesions.Normal dentition.  Neck: normal, supple, no masses, no thyromegaly Respiratory: Diminished breathing sound bilaterally, diffused wheezing, no crackles, increasing breathing effort, No accessory muscle use.  Cardiovascular: Regular rate and rhythm, no murmurs / rubs / gallops. No extremity edema. 2+ pedal pulses. No carotid bruits.  Abdomen: no tenderness, no masses palpated. No hepatosplenomegaly. Bowel sounds positive.  Musculoskeletal: no clubbing / cyanosis. No joint deformity upper and lower extremities. Good ROM, no contractures. Normal muscle tone.  Skin: no rashes, lesions, ulcers. No induration Neurologic: CN 2-12 grossly intact. Sensation  intact, DTR normal. Strength 5/5 in all 4.  Psychiatric: Normal judgment and insight. Alert and oriented x 3. Normal mood.    Labs on Admission: I have personally reviewed following labs and imaging studies  CBC: Recent Labs  Lab 01/28/24 1252  WBC 4.9  NEUTROABS 3.8  HGB 14.1  HCT 43.8  MCV 99.1  PLT 149*   Basic Metabolic Panel: Recent Labs  Lab 01/28/24 1252  NA 147*  K 3.4*  CL 107  CO2 28  GLUCOSE 90  BUN 19  CREATININE 1.00  CALCIUM  8.7*   GFR: CrCl cannot be calculated (Unknown ideal weight.). Liver Function Tests: No results for input(s): "AST", "ALT", "ALKPHOS", "BILITOT", "PROT", "ALBUMIN" in the last 168 hours. No results for input(s): "LIPASE", "AMYLASE" in the last 168 hours. No results for input(s): "AMMONIA" in the last 168 hours. Coagulation Profile: No results for input(s): "INR", "PROTIME" in the last 168 hours. Cardiac Enzymes: No results for input(s): "CKTOTAL", "CKMB", "CKMBINDEX", "TROPONINI" in the last 168 hours. BNP (last 3 results) No results for input(s): "PROBNP" in the last 8760 hours. HbA1C: No results for input(s): "HGBA1C" in the last 72 hours. CBG: No results for input(s): "GLUCAP" in the last 168 hours. Lipid Profile: No results for input(s): "CHOL", "HDL", "LDLCALC", "TRIG", "CHOLHDL", "LDLDIRECT" in the last 72 hours. Thyroid  Function Tests: No results for input(s): "TSH", "T4TOTAL", "FREET4", "T3FREE", "THYROIDAB" in the last 72 hours. Anemia Panel: No results for input(s): "VITAMINB12", "FOLATE", "FERRITIN", "TIBC", "IRON", "RETICCTPCT" in the last 72 hours. Urine analysis:    Component Value Date/Time   COLORURINE YELLOW (A) 10/12/2022 1204   APPEARANCEUR HAZY (A) 10/12/2022 1204   LABSPEC 1.025 10/12/2022 1204   PHURINE 5.0 10/12/2022 1204   GLUCOSEU NEGATIVE 10/12/2022 1204   HGBUR NEGATIVE 10/12/2022 1204   BILIRUBINUR NEGATIVE 10/12/2022 1204   KETONESUR NEGATIVE 10/12/2022 1204   PROTEINUR NEGATIVE 10/12/2022  1204   NITRITE NEGATIVE 10/12/2022 1204   LEUKOCYTESUR SMALL (A) 10/12/2022 1204    Radiological Exams on Admission: DG Chest 2 View Result Date: 01/28/2024 CLINICAL DATA:  cough, fever, r/o pna. EXAM: CHEST - 2 VIEW COMPARISON:  07/18/2010. FINDINGS: There are increased interstitial markings throughout bilateral lungs. Findings are nonspecific and differential diagnosis includes underlying interstitial lung disease, pulmonary edema, atypical pneumonia, etc. Correlate clinically to determine the need for additional imaging with chest CT scan. No dense consolidation or lung collapse. Bilateral costophrenic angles are clear. Stable cardio-mediastinal  silhouette. No acute osseous abnormalities. The soft tissues are within normal limits. IMPRESSION: *Increased interstitial markings, as described above. Electronically Signed   By: Beula Brunswick M.D.   On: 01/28/2024 15:53    EKG: Independently reviewed.  Rate controlled A-fib, no acute ST changes.  Assessment/Plan Principal Problem:   Asthma Active Problems:   Asthma, chronic obstructive, with acute exacerbation (HCC)  (please populate well all problems here in Problem List. (For example, if patient is on BP meds at home and you resume or decide to hold them, it is a problem that needs to be her. Same for CAD, COPD, HLD and so on)  Acute asthma/COPD exacerbation, failed outpatient management -Will check respiratory pathogen's panel, will check procalcitonin level.  Also send atypical pneumonia study patient around the mycoplasma -Continue doxycycline  -Continue IV Solu-Medrol  -ICS and LABA -DuoNebs and as needed albuterol  -Incentive spirometry and flutter valve -Peak flow -Ambulatory pulse ox tomorrow -Outpatient follow-up with pulmonary for lung function test  Hypokalemia -P.o. replacement, recheck level tomorrow  Hypernatremia -Dehydration, recommend increase p.o. intake fluid  HTN -Continue amlodipine   PAF -Rate controlled A-fib  on EKG -Not on any rate control medications -Continue Eliquis   RA -Poorly controlled, continue DMARDs of hydroxychloroquine  and weekly methotrexate  -Continue as needed narcotics    DVT prophylaxis: Eliquis  Code Status: Full code Family Communication: Daughter at bedside Disposition Plan: Expect less than 2 midnight hospital Consults called: None Admission status: Tele obs   [place] MD [facility] Pager [phone]  01/28/2024, 4:55 PM

## 2024-01-28 NOTE — ED Provider Notes (Signed)
[facility] Provider Note    Event Date/Time   First MD Initiated Contact with Patient 01/28/24 1346     (approximate)   History   Shortness of Breath   HPI  [name] is a 73 y.o. female with a history of asthma, CKD, COPD, hypertension who presents with complaints of shortness of breath.  Patient reports she was seen earlier this week at urgent care, started on prednisone, doxycycline for presumed bronchitis, she reports she is not significantly improved.  Thinks that she may have had fevers but is unclear.  No calf pain or swelling, she does have chronic lymphedema in the lower extremities, unchanged.     Physical Exam   Triage Vital Signs: ED Triage Vitals  Encounter Vitals Group     BP 01/28/24 1239 (!) 148/81     Systolic BP Percentile --      Diastolic BP Percentile --      Pulse Rate 01/28/24 1239 79     Resp 01/28/24 1239 18     Temp 01/28/24 1239 98.1 F (36.7 C)     Temp Source 01/28/24 1239 Oral     SpO2 01/28/24 1239 95 %     Weight --      Height --      Head Circumference --      Peak Flow --      Pain Score 01/28/24 1231 2     Pain Loc --      Pain Education --      Exclude from Growth Chart --     Most recent vital signs: Vitals:   01/31/24 1934 01/31/24 2201  BP:  135/60  Pulse:  75  Resp:  18  Temp:  98.7 F (37.1 C)  SpO2: 94% 93%     General: Awake, no distress.  CV:  Good peripheral perfusion.  Resp:  Normal effort.  Diffuse mild wheezing Abd:  No distention.  Other:  Mild edema bilaterally which she reports is chronic   ED Results / Procedures / Treatments   Labs (all labs ordered are listed, but only abnormal results are displayed) Labs Reviewed  RESPIRATORY PANEL BY PCR - Abnormal; Notable for the following components:      Result Value   Metapneumovirus DETECTED (*)    All other components within normal limits  BASIC METABOLIC PANEL - Abnormal; Notable for the following components:    Sodium 147 (*)    Potassium 3.4 (*)    Calcium 8.7 (*)    All other components within normal limits  CBC WITH DIFFERENTIAL/PLATELET - Abnormal; Notable for the following components:   Platelets 149 (*)    Lymphs Abs 0.6 (*)    All other components within normal limits  BASIC METABOLIC PANEL - Abnormal; Notable for the following components:   Glucose, Bld 129 (*)    BUN 24 (*)    Creatinine, Ser 1.06 (*)    Calcium 7.9 (*)    GFR, Estimated 56 (*)    All other components within normal limits  BASIC METABOLIC PANEL - Abnormal; Notable for the following components:   Glucose, Bld 142 (*)    BUN 29 (*)    Creatinine, Ser 1.01 (*)    Calcium 8.2 (*)    GFR, Estimated 60 (*)    All other components within normal limits  GLUCOSE, CAPILLARY - Abnormal; Notable for the following components:   Glucose-Capillary 177 (*)    All other components within  normal limits  PROCALCITONIN  MYCOPLASMA PNEUMONIAE ANTIBODY, IGM     EKG  ED ECG REPORT I, Jene Every, the attending physician, personally viewed and interpreted this ECG.  Date: 01/28/2024  Rhythm: normal sinus rhythm QRS Axis: normal Intervals: normal ST/T Wave abnormalities: normal Narrative Interpretation: no evidence of acute ischemia    RADIOLOGY Chest x-ray viewed interpret by me, questionable pneumonia versus edema, pending radiology review    PROCEDURES:  Critical Care performed:   Procedures   MEDICATIONS ORDERED IN ED: Medications  acetaminophen (TYLENOL) tablet 500-1,000 mg (500 mg Oral Given 01/29/24 1420)  hydroxychloroquine (PLAQUENIL) tablet 200 mg (200 mg Oral Given 01/31/24 2154)  amLODipine (NORVASC) tablet 10 mg (10 mg Oral Given 01/31/24 1101)  atorvastatin (LIPITOR) tablet 5 mg (5 mg Oral Given 01/31/24 2154)  ondansetron (ZOFRAN) tablet 4 mg (has no administration in time range)  apixaban (ELIQUIS) tablet 5 mg (5 mg Oral Given 01/31/24 2154)  loratadine (CLARITIN) tablet 10 mg (10 mg Oral Given  01/31/24 1101)  lidocaine (LIDODERM) 5 % 1 patch (1 patch Transdermal Patch Applied 01/31/24 1912)  methylPREDNISolone sodium succinate (SOLU-MEDROL) 40 mg/mL injection 40 mg (40 mg Intravenous Given 01/31/24 2153)  benzonatate (TESSALON) capsule 100 mg (100 mg Oral Given 02/01/24 0049)  guaiFENesin (MUCINEX) 12 hr tablet 1,200 mg (1,200 mg Oral Given 01/31/24 2153)  hydrOXYzine (ATARAX) tablet 50 mg (50 mg Oral Given 01/31/24 2154)  PARoxetine (PAXIL) tablet 30 mg (30 mg Oral Given 01/31/24 1101)  potassium chloride (KLOR-CON M) CR tablet 10 mEq (10 mEq Oral Given 01/31/24 1101)  methotrexate (RHEUMATREX) tablet 15 mg (15 mg Oral Given 01/31/24 1101)  arformoterol (BROVANA) nebulizer solution 15 mcg (15 mcg Nebulization Given 01/31/24 1933)  ipratropium-albuterol (DUONEB) 0.5-2.5 (3) MG/3ML nebulizer solution 3 mL (3 mLs Nebulization Given 01/31/24 1933)  pantoprazole (PROTONIX) EC tablet 40 mg (40 mg Oral Given 01/31/24 1101)  oxyCODONE (Oxy IR/ROXICODONE) immediate release tablet 2.5-5 mg (2.5 mg Oral Given 01/31/24 1852)  ipratropium-albuterol (DUONEB) 0.5-2.5 (3) MG/3ML nebulizer solution 3 mL (3 mLs Nebulization Given 01/28/24 1438)  ipratropium-albuterol (DUONEB) 0.5-2.5 (3) MG/3ML nebulizer solution 3 mL (3 mLs Nebulization Given 01/28/24 1438)  ipratropium-albuterol (DUONEB) 0.5-2.5 (3) MG/3ML nebulizer solution 3 mL (3 mLs Nebulization Given 01/28/24 1518)  methylPREDNISolone sodium succinate (SOLU-MEDROL) 125 mg/2 mL injection 125 mg (125 mg Intravenous Given 01/28/24 1519)  potassium chloride SA (KLOR-CON M) CR tablet 40 mEq (40 mEq Oral Given 01/28/24 1725)     IMPRESSION / MDM / ASSESSMENT AND PLAN / ED COURSE  I reviewed the triage vital signs and the nursing notes. Patient's presentation is most consistent with acute presentation with potential threat to life or bodily function.  Patient presents with shortness of breath, wheezing on exam.  Differential includes bronchospasm, COPD  exacerbation, asthma exacerbation, pneumonia, CHF/pulmonary edema  Will obtain labs, x-ray, treat with DuoNebs and reevaluate  I have asked my colleague to reevaluate to determine whether admission is required      FINAL CLINICAL IMPRESSION(S) / ED DIAGNOSES   Final diagnoses:  COPD exacerbation (HCC)     Rx / DC Orders   ED Discharge Orders     None        Note:  This document was prepared using Dragon voice recognition software and may include unintentional dictation errors.   Jene Every, MD 02/01/24 [phone]

## 2024-01-28 NOTE — ED Notes (Signed)
 Pt up to bathroom   family with pt.

## 2024-01-29 DIAGNOSIS — J45901 Unspecified asthma with (acute) exacerbation: Secondary | ICD-10-CM | POA: Diagnosis not present

## 2024-01-29 LAB — BASIC METABOLIC PANEL
Anion gap: 8 (ref 5–15)
BUN: 24 mg/dL — ABNORMAL HIGH (ref 8–23)
CO2: 25 mmol/L (ref 22–32)
Calcium: 7.9 mg/dL — ABNORMAL LOW (ref 8.9–10.3)
Chloride: 106 mmol/L (ref 98–111)
Creatinine, Ser: 1.06 mg/dL — ABNORMAL HIGH (ref 0.44–1.00)
GFR, Estimated: 56 mL/min — ABNORMAL LOW (ref >60)
Glucose, Bld: 129 mg/dL — ABNORMAL HIGH (ref 70–99)
Potassium: 4.4 mmol/L (ref 3.5–5.1)
Sodium: 142 mmol/L (ref 135–145)

## 2024-01-29 MED ORDER — METHOTREXATE SODIUM 2.5 MG PO TABS
15.0000 mg | ORAL_TABLET | ORAL | Status: DC
  Administered 2024-01-31: 15 mg via ORAL
  Filled 2024-01-29: qty 6

## 2024-01-29 MED ORDER — IPRATROPIUM-ALBUTEROL 0.5-2.5 (3) MG/3ML IN SOLN
3.0000 mL | Freq: Two times a day (BID) | RESPIRATORY_TRACT | Status: DC
  Administered 2024-01-29 – 2024-02-01 (×6): 3 mL via RESPIRATORY_TRACT
  Filled 2024-01-29 (×6): qty 3

## 2024-01-29 MED ORDER — ARFORMOTEROL TARTRATE 15 MCG/2ML IN NEBU
15.0000 ug | INHALATION_SOLUTION | Freq: Two times a day (BID) | RESPIRATORY_TRACT | Status: DC
  Administered 2024-01-29 – 2024-02-01 (×7): 15 ug via RESPIRATORY_TRACT
  Filled 2024-01-29 (×8): qty 2

## 2024-01-29 MED ORDER — PAROXETINE HCL 30 MG PO TABS
30.0000 mg | ORAL_TABLET | Freq: Every day | ORAL | Status: DC
Start: 2024-01-29 — End: 2024-02-01
  Administered 2024-01-29 – 2024-02-01 (×4): 30 mg via ORAL
  Filled 2024-01-29 (×4): qty 1

## 2024-01-29 MED ORDER — POTASSIUM CHLORIDE CRYS ER 10 MEQ PO TBCR
10.0000 meq | EXTENDED_RELEASE_TABLET | Freq: Every day | ORAL | Status: DC
  Administered 2024-01-29 – 2024-02-01 (×4): 10 meq via ORAL
  Filled 2024-01-29 (×4): qty 1

## 2024-01-29 MED ORDER — MOMETASONE FURO-FORMOTEROL FUM 100-5 MCG/ACT IN AERO
2.0000 | INHALATION_SPRAY | Freq: Two times a day (BID) | RESPIRATORY_TRACT | Status: DC
  Filled 2024-01-29: qty 8.8

## 2024-01-29 MED ORDER — HYDROXYZINE HCL 50 MG PO TABS
50.0000 mg | ORAL_TABLET | Freq: Every day | ORAL | Status: DC
  Administered 2024-01-29 – 2024-01-31 (×3): 50 mg via ORAL
  Filled 2024-01-29 (×3): qty 1

## 2024-01-29 MED ORDER — IPRATROPIUM-ALBUTEROL 0.5-2.5 (3) MG/3ML IN SOLN
3.0000 mL | Freq: Four times a day (QID) | RESPIRATORY_TRACT | Status: DC
  Administered 2024-01-29: 3 mL via RESPIRATORY_TRACT
  Filled 2024-01-29: qty 3

## 2024-01-29 NOTE — Plan of Care (Signed)
Problem: Education: Goal: Knowledge of General Education information will improve Description: Including pain rating scale, medication(s)/side effects and non-pharmacologic comfort measures Outcome: Progressing   Problem: Clinical Measurements: Goal: Ability to maintain clinical measurements within normal limits will improve Outcome: Progressing   Problem: Activity: Goal: Risk for activity intolerance will decrease Outcome: Progressing   Problem: Nutrition: Goal: Adequate nutrition will be maintained Outcome: Progressing   Problem: Pain Managment: Goal: General experience of comfort will improve and/or be controlled Outcome: Progressing   Problem: Safety: Goal: Ability to remain free from injury will improve Outcome: Progressing

## 2024-01-29 NOTE — Evaluation (Signed)
Physical Therapy Evaluation Patient Details Name: ETTA GASSETT MRN: [id number] DOB: Dec 23, 1950 Today's Date: 01/29/2024  History of Present Illness  presented to ER secondary to progressive cough, wheezing, SOB; admitted for management of asthma, COPD exacerbation  Clinical Impression  Patient resting in bed upon arrival to room; supportive daughter at bedside.  Patient alert and oriented, follows commands and agreeable to participation with session.  Denies pain; does endorse persistent cough, SOB. Bilat UE/LE strength and ROM grossly symmetrical and WFL; no focal weakness appreciated.  Able to complete bed mobility with sup/mod indep; sit/stand, standing balance and bed/chair transfers with RW, cga/min assist.  Does require bilat UE support for lift off and stabilization once standing.  Marked SOB with minimal exertion (BORG 9/10 after bed/chair transfer), though sats >94% on RA; additional gait efforts deferred as a result.  Will continue to assess/progress in subsequent sessions as appropriate.   Would benefit from skilled PT to address above deficits and promote optimal return to PLOF.; recommend post-acute PT follow up as indicated by interdisciplinary care team.          If plan is discharge home, recommend the following: A little help with walking and/or transfers;A little help with bathing/dressing/bathroom   Can travel by private vehicle        Equipment Recommendations    Recommendations for Other Services       Functional Status Assessment Patient has had a recent decline in their functional status and demonstrates the ability to make significant improvements in function in a reasonable and predictable amount of time.     Precautions / Restrictions Precautions Precautions: Fall Restrictions Weight Bearing Restrictions Per Provider Order: No      Mobility  Bed Mobility Overal bed mobility: Modified Independent, Needs Assistance Bed Mobility: Supine to Sit      Supine to sit: Modified independent (Device/Increase time)          Transfers Overall transfer level: Needs assistance Equipment used: Rolling walker (2 wheels) Transfers: Sit to/from Stand, Bed to chair/wheelchair/BSC Sit to Stand: Min assist, Contact guard assist Stand pivot transfers: Contact guard assist, Min assist              Ambulation/Gait               General Gait Details: deferred due to SOB/fatigue with minimal exertion; BORG 9/10 with bed/chair transfer  Stairs            Wheelchair Mobility     Tilt Bed    Modified Rankin (Stroke Patients Only)       Balance Overall balance assessment: Needs assistance Sitting-balance support: No upper extremity supported, Feet supported Sitting balance-Leahy Scale: Good     Standing balance support: Bilateral upper extremity supported Standing balance-Leahy Scale: Fair                               Pertinent Vitals/Pain Pain Assessment Pain Assessment: No/denies pain    Home Living Family/patient expects to be discharged to:: Private residence Living Arrangements: Alone   Type of Home: Apartment Home Access: Level entry       Home Layout: One level Home Equipment: Cane - single Librarian, academic (2 wheels)      Prior Function Prior Level of Function : Independent/Modified Independent             Mobility Comments: Mod indep with ADLs, household and limited community mobilization with [id number].  Denies fall  history; no home O2.       Extremity/Trunk Assessment   Upper Extremity Assessment Upper Extremity Assessment: Overall WFL for tasks assessed    Lower Extremity Assessment Lower Extremity Assessment: Overall WFL for tasks assessed (grossly at least 4/5 throughout)       Communication        Cognition Arousal: Alert Behavior During Therapy: [facility] for tasks assessed/performed   PT - Cognitive impairments: No apparent impairments                                  Cueing       General Comments      Exercises Other Exercises Other Exercises: Toilet transfer, SPT wiht RW, min assist; sit/stand and standing balance with RW, min assist   Assessment/Plan    PT Assessment Patient needs continued PT services  PT Problem List Decreased strength;Decreased range of motion;Decreased activity tolerance;Decreased balance;Decreased knowledge of use of DME;Decreased mobility;Decreased safety awareness;Decreased knowledge of precautions;Cardiopulmonary status limiting activity       PT Treatment Interventions DME instruction;Gait training;Stair training;Functional mobility training;Therapeutic activities;Therapeutic exercise;Balance training;Patient/family education    PT Goals (Current goals can be found in the Care Plan section)  Acute Rehab PT Goals Patient Stated Goal: to return home PT Goal Formulation: With patient Time For Goal Achievement: 02/12/24 Potential to Achieve Goals: Good    Frequency Min 1X/week     Co-evaluation               AM-PAC PT "6 Clicks" Mobility  Outcome Measure Help needed turning from your back to your side while in a flat bed without using bedrails?: None Help needed moving from lying on your back to sitting on the side of a flat bed without using bedrails?: A Little Help needed moving to and from a bed to a chair (including a wheelchair)?: A Little Help needed standing up from a chair using your arms (e.g., wheelchair or bedside chair)?: A Little Help needed to walk in hospital room?: A Little Help needed climbing 3-5 steps with a railing? : A Little 6 Click Score: 19    End of Session   Activity Tolerance: Patient tolerated treatment well Patient left: in chair;with call bell/phone within reach;with chair alarm set Nurse Communication: Mobility status PT Visit Diagnosis: Muscle weakness (generalized) (M62.81);Difficulty in walking, not elsewhere classified (R26.2)    Time:  1100-1130 PT Time Calculation (min) (ACUTE ONLY): 30 min   Charges:   PT Evaluation $PT Eval Moderate Complexity: 1 Mod PT Treatments $Therapeutic Activity: 8-22 mins PT General Charges $$ ACUTE PT VISIT: 1 Visit        Dushaun Okey H. Manson Passey, PT, DPT, NCS 01/29/24, 9:19 PM [phone]

## 2024-01-29 NOTE — Progress Notes (Signed)
   01/28/24 2102  Vitals  Temp 98 F (36.7 C)  Temp Source Oral  BP (!) 145/59  MAP (mmHg) 85  BP Location Right Arm  BP Method Automatic  Patient Position (if appropriate) Lying  Pulse Rate (!) 101  Pulse Rate Source Monitor  Resp 18  MEWS COLOR  MEWS Score Color Green  Oxygen Therapy  SpO2 93 %  O2 Device Room Air  MEWS Score  MEWS Temp 0  MEWS Systolic 0  MEWS Pulse 1  MEWS RR 0  MEWS LOC 0  MEWS Score 1   Patient admitted from ED alert on room air.  Isolation precautions in place, vitals stable, and call bell education provided.  Plan of care continues.

## 2024-01-29 NOTE — Progress Notes (Signed)
PROGRESS NOTE    [name]  ZOX:[id number] DOB: 1951/06/30 DOA: 01/28/2024 PCP: Marina Goodell, MD    Brief Narrative:  73 y.o. female with medical history significant of asthma/COPD, PAF on Eliquis, HTN, HLD, GERD, rheumatoid arthritis on DMARDs, presented with persistent cough wheezing shortness of breath.   Patient started to have URI like symptoms about 5 days ago, symptoms including runny nose congestions sore throat, soon patient started develop dry cough and wheezing.  She denied any fever or chills but she attributed to that she has been taking Tylenol every 8 hours for the poorly controlled RA joint pains in her knees.  3 days ago she went to urgent care, when she was diagnosed with acute bronchitis, patient was sent home with doxycycline and prednisone.  So far patient has able to take only 2 doses of those medications but she continued to experience worsening of dry cough wheezing and exertional dyspnea.  Today she feels extremely shortness of breath even at rest.  Denies any chest pain, no nauseous vomiting no abdominal pain no diarrhea.  She is a never smoker   Assessment & Plan:   Principal Problem:   Asthma Active Problems:   Asthma, chronic obstructive, with acute exacerbation (HCC)  Metapneumovirus infection Noted on last.  No evidence of bacterial coinfection.  Procalcitonin negative Plan: Respiratory precautions Steroids as below Oxygen as necessary Bronchodilators    Acute asthma/COPD exacerbation, failed outpatient management Likely triggered by viral syndrome Plan: Continue Solu-Medrol 40 mg IV twice daily, wean within 24 hours Bronchodilators Incentive spirometry and flutter valve Oxygen as necessary   Hypokalemia Monitor and replace PRN   Hypernatremia Suspect 2/2 dehydration Encourage PO fluid intake   HTN -Continue amlodipine   PAF -Rate controlled A-fib on EKG -Not on any rate control medications -Continue Eliquis   RA -Poorly  controlled, continue DMARDs of hydroxychloroquine and weekly methotrexate -Continue as needed narcotics   DVT prophylaxis: Eliquis Code Status: Full Family Communication:None today Disposition Plan: Status is: Observation The patient will require care spanning > 2 midnights and should be moved to inpatient because: Acute COPD/asthma exacerbation on intravenous steroids and scheduled bronchodilators in the setting of human metapneumovirus infection    Level of care: Telemetry Medical  Consultants:  None  Procedures:  None  Antimicrobials: None    Subjective: Seen and examined.  Resting in bed.  Still coughing with audible wheeze.  No pain complaints.  Objective: Vitals:   01/28/24 2015 01/28/24 2102 01/29/24 1022 01/29/24 1156  BP: (!) 149/122 (!) 145/59 (!) 144/79   Pulse: 61 (!) 101 68   Resp: 14 18 16    Temp:  98 F (36.7 C) 98.1 F (36.7 C)   TempSrc:  Oral    SpO2:  93% 95% 96%   No intake or output data in the 24 hours ending 01/29/24 1159 There were no vitals filed for this visit.  Examination:  General exam: Appears calm and comfortable  Respiratory system: Scattered crackles and wheeze.  Normal work of breathing.  Room air Cardiovascular system: S1-S2, regular rate, irregular rhythm, no murmurs, no pedal edema Gastrointestinal system: Soft, NT/ND, normal bowel sounds Central nervous system: Alert and oriented. No focal neurological deficits. Extremities: Decreased power bilateral lower extremities.  Gait not assessed Skin: No rashes, lesions or ulcers Psychiatry: Judgement and insight appear normal. Mood & affect appropriate.     Data Reviewed: I have personally reviewed following labs and imaging studies  CBC: Recent Labs  Lab 01/28/24 1252  WBC 4.9  NEUTROABS 3.8  HGB 14.1  HCT 43.8  MCV 99.1  PLT 149*   Basic Metabolic Panel: Recent Labs  Lab 01/28/24 1252 01/29/24 0702  NA 147* 142  K 3.4* 4.4  CL 107 106  CO2 28 25  GLUCOSE 90  129*  BUN 19 24*  CREATININE 1.00 1.06*  CALCIUM 8.7* 7.9*   GFR: CrCl cannot be calculated (Unknown ideal weight.). Liver Function Tests: No results for input(s): "AST", "ALT", "ALKPHOS", "BILITOT", "PROT", "ALBUMIN" in the last 168 hours. No results for input(s): "LIPASE", "AMYLASE" in the last 168 hours. No results for input(s): "AMMONIA" in the last 168 hours. Coagulation Profile: No results for input(s): "INR", "PROTIME" in the last 168 hours. Cardiac Enzymes: No results for input(s): "CKTOTAL", "CKMB", "CKMBINDEX", "TROPONINI" in the last 168 hours. BNP (last 3 results) No results for input(s): "PROBNP" in the last 8760 hours. HbA1C: No results for input(s): "HGBA1C" in the last 72 hours. CBG: No results for input(s): "GLUCAP" in the last 168 hours. Lipid Profile: No results for input(s): "CHOL", "HDL", "LDLCALC", "TRIG", "CHOLHDL", "LDLDIRECT" in the last 72 hours. Thyroid Function Tests: No results for input(s): "TSH", "T4TOTAL", "FREET4", "T3FREE", "THYROIDAB" in the last 72 hours. Anemia Panel: No results for input(s): "VITAMINB12", "FOLATE", "FERRITIN", "TIBC", "IRON", "RETICCTPCT" in the last 72 hours. Sepsis Labs: Recent Labs  Lab 01/28/24 1252  PROCALCITON <0.10    Recent Results (from the past 240 hours)  Respiratory (~20 pathogens) panel by PCR     Status: Abnormal   Collection Time: 01/28/24  5:26 PM   Specimen: Nasopharyngeal Swab; Respiratory  Result Value Ref Range Status   Adenovirus NOT DETECTED NOT DETECTED Final   Coronavirus 229E NOT DETECTED NOT DETECTED Final    Comment: (NOTE) The Coronavirus on the Respiratory Panel, DOES NOT test for the novel  Coronavirus (2019 nCoV)    Coronavirus HKU1 NOT DETECTED NOT DETECTED Final   Coronavirus NL63 NOT DETECTED NOT DETECTED Final   Coronavirus OC43 NOT DETECTED NOT DETECTED Final   Metapneumovirus DETECTED (A) NOT DETECTED Final   Rhinovirus / Enterovirus NOT DETECTED NOT DETECTED Final   Influenza  A NOT DETECTED NOT DETECTED Final   Influenza B NOT DETECTED NOT DETECTED Final   Parainfluenza Virus 1 NOT DETECTED NOT DETECTED Final   Parainfluenza Virus 2 NOT DETECTED NOT DETECTED Final   Parainfluenza Virus 3 NOT DETECTED NOT DETECTED Final   Parainfluenza Virus 4 NOT DETECTED NOT DETECTED Final   Respiratory Syncytial Virus NOT DETECTED NOT DETECTED Final   Bordetella pertussis NOT DETECTED NOT DETECTED Final   Bordetella Parapertussis NOT DETECTED NOT DETECTED Final   Chlamydophila pneumoniae NOT DETECTED NOT DETECTED Final   Mycoplasma pneumoniae NOT DETECTED NOT DETECTED Final    Comment: Performed at [facility], [address], [place], [place] [address]         Radiology Studies: DG Chest 2 View Result Date: 01/28/2024 CLINICAL DATA:  cough, fever, r/o pna. EXAM: CHEST - 2 VIEW COMPARISON:  07/18/2010. FINDINGS: There are increased interstitial markings throughout bilateral lungs. Findings are nonspecific and differential diagnosis includes underlying interstitial lung disease, pulmonary edema, atypical pneumonia, etc. Correlate clinically to determine the need for additional imaging with chest CT scan. No dense consolidation or lung collapse. Bilateral costophrenic angles are clear. Stable cardio-mediastinal silhouette. No acute osseous abnormalities. The soft tissues are within normal limits. IMPRESSION: *Increased interstitial markings, as described above. Electronically Signed   By: Timoteo Expose.D.  On: 01/28/2024 15:53        Scheduled Meds:  amLODipine  10 mg Oral Daily   apixaban  5 mg Oral BID   arformoterol  15 mcg Nebulization BID   atorvastatin  5 mg Oral QHS   doxycycline  100 mg Oral Q12H   guaiFENesin  1,200 mg Oral BID   hydroxychloroquine  200 mg Oral BID   hydrOXYzine  50 mg Oral QHS   ipratropium-albuterol  3 mL Nebulization Q6H   lidocaine  1 patch Transdermal Q24H   loratadine  10 mg Oral Daily   [START ON 01/31/2024] methotrexate   15 mg Oral Weekly   methylPREDNISolone (SOLU-MEDROL) injection  40 mg Intravenous Q12H   PARoxetine  30 mg Oral Daily   potassium chloride  10 mEq Oral Daily   Continuous Infusions:   LOS: 0 days    Tresa Moore, MD [facility]   If 7PM-7AM, please contact night-coverage  01/29/2024, 11:59 AM

## 2024-01-30 DIAGNOSIS — J45901 Unspecified asthma with (acute) exacerbation: Secondary | ICD-10-CM | POA: Diagnosis not present

## 2024-01-30 DIAGNOSIS — J208 Acute bronchitis due to other specified organisms: Secondary | ICD-10-CM

## 2024-01-30 DIAGNOSIS — E669 Obesity, unspecified: Secondary | ICD-10-CM | POA: Insufficient documentation

## 2024-01-30 LAB — MYCOPLASMA PNEUMONIAE ANTIBODY, IGM: Mycoplasma pneumo IgM: <770 U/mL (ref 0–769)

## 2024-01-30 MED ORDER — PANTOPRAZOLE SODIUM 40 MG PO TBEC
40.0000 mg | DELAYED_RELEASE_TABLET | Freq: Every day | ORAL | Status: DC
  Administered 2024-01-31 – 2024-02-01 (×2): 40 mg via ORAL
  Filled 2024-01-30 (×2): qty 1

## 2024-01-30 NOTE — Care Management Obs Status (Signed)
MEDICARE OBSERVATION STATUS NOTIFICATION   Patient Details  Name: [name] MRN: [id number] Date of Birth: 12-Sep-1951   Medicare Observation Status Notification Given:  Orland Dec, CMA 01/30/2024, 10:10 AM

## 2024-01-30 NOTE — Progress Notes (Signed)
Physical Therapy Treatment Patient Details Name: [name] MRN: [id number] DOB: [birth year] Today's Date: 01/30/2024   History of Present Illness presented to ER secondary to progressive cough, wheezing, SOB; admitted for management of asthma, COPD exacerbation.    PT Comments  Pt supine in bed upon PT arrival, daughter at bedside. Patient pleasantly agreeable to PT tx session this date. Pt continues to be able to complete bed mobility MOD I with increased time. Patient require MIN A to STS from EOB, with use of RW. Patient able to ambulate approx 25 ft in room with RW and CGA, patient demo decreased endurance/activity tolerance. Increased SOB, though vitals stable. Patient will continue to benefit from skilled PT services. Discharge recommendation remains appropriate. Will continue to follow acutely.    If plan is discharge home, recommend the following: A little help with walking and/or transfers;A little help with bathing/dressing/bathroom   Can travel by private vehicle        Equipment Recommendations  Other (comment) (TBD at next level of care)    Recommendations for Other Services       Precautions / Restrictions Precautions Precautions: Fall Restrictions Weight Bearing Restrictions Per Provider Order: No     Mobility  Bed Mobility Overal bed mobility: Modified Independent Bed Mobility: Supine to Sit, Sit to Supine     Supine to sit: Modified independent (Device/Increase time) Sit to supine: Modified independent (Device/Increase time)   General bed mobility comments: Mod I; with use of rails. Inc time required for completion. Mild SOB after bed mobility.    Transfers Overall transfer level: Needs assistance Equipment used: Rolling walker (2 wheels) Transfers: Sit to/from Stand Sit to Stand: Min assist           General transfer comment: Pt require Min A to stand from EOB, with use of RW.    Ambulation/Gait Ambulation/Gait assistance: Contact guard  assist Gait Distance (Feet): 20 Feet Assistive device: Rolling walker (2 wheels)   Gait velocity: Decreased     General Gait Details: Pt able to ambulate approx 25 ft in room with use of RW, CGA. Slow gait. SOB 5/10; BORG 9/10 after ambulation.   Stairs             Wheelchair Mobility     Tilt Bed    Modified Rankin (Stroke Patients Only)       Balance Overall balance assessment: Needs assistance Sitting-balance support: No upper extremity supported, Feet supported Sitting balance-Leahy Scale: Good     Standing balance support: Bilateral upper extremity supported, During functional activity, Reliant on assistive device for balance Standing balance-Leahy Scale: Fair Standing balance comment: increased reliance on RW                            Communication Communication Communication: No apparent difficulties  Cognition Arousal: Alert Behavior During Therapy: WFL for tasks assessed/performed   PT - Cognitive impairments: No apparent impairments                         Following commands: Intact      Cueing    Exercises      General Comments        Pertinent Vitals/Pain Pain Assessment Pain Assessment: 0-10 Pain Score: 4  Pain Location: Head Pain Descriptors / Indicators: Headache Pain Intervention(s): Monitored during session    Home Living  Prior Function            PT Goals (current goals can now be found in the care plan section) Acute Rehab PT Goals Patient Stated Goal: to return home PT Goal Formulation: With patient Time For Goal Achievement: 02/12/24 Potential to Achieve Goals: Good Progress towards PT goals: Progressing toward goals    Frequency    Min 1X/week      PT Plan      Co-evaluation              AM-PAC PT "6 Clicks" Mobility   Outcome Measure  Help needed turning from your back to your side while in a flat bed without using bedrails?: None Help  needed moving from lying on your back to sitting on the side of a flat bed without using bedrails?: A Little Help needed moving to and from a bed to a chair (including a wheelchair)?: A Little Help needed standing up from a chair using your arms (e.g., wheelchair or bedside chair)?: A Little Help needed to walk in hospital room?: A Little Help needed climbing 3-5 steps with a railing? : A Little 6 Click Score: 19    End of Session Equipment Utilized During Treatment: Gait belt Activity Tolerance: Patient tolerated treatment well Patient left: in bed;with bed alarm set;with call bell/phone within reach;with family/visitor present Nurse Communication: Mobility status PT Visit Diagnosis: Muscle weakness (generalized) (M62.81);Difficulty in walking, not elsewhere classified (R26.2)     Time: 4098-1191 PT Time Calculation (min) (ACUTE ONLY): 19 min  Charges:    $Therapeutic Activity: 8-22 mins PT General Charges $$ ACUTE PT VISIT: 1 Visit                     Howie Ill, PT, DPT 01/30/24 12:10 PM

## 2024-01-30 NOTE — Progress Notes (Signed)
PROGRESS NOTE    [name]  MVH:[id number] DOB: Mar 18, 1951 DOA: 01/28/2024 PCP: Marina Goodell, MD    Brief Narrative:  73 y.o. female with medical history significant of asthma/COPD, PAF on Eliquis, HTN, HLD, GERD, rheumatoid arthritis on DMARDs, presented with persistent cough wheezing shortness of breath.   Patient started to have URI like symptoms about 5 days ago, symptoms including runny nose congestions sore throat, soon patient started develop dry cough and wheezing.  She denied any fever or chills but she attributed to that she has been taking Tylenol every 8 hours for the poorly controlled RA joint pains in her knees.  3 days ago she went to urgent care, when she was diagnosed with acute bronchitis, patient was sent home with doxycycline and prednisone.  So far patient has able to take only 2 doses of those medications but she continued to experience worsening of dry cough wheezing and exertional dyspnea.  Today she feels extremely shortness of breath even at rest.  Denies any chest pain, no nauseous vomiting no abdominal pain no diarrhea.  She is a never smoker   Assessment & Plan:   Principal Problem:   Asthma Active Problems:   Asthma, chronic obstructive, with acute exacerbation (HCC)  Metapneumovirus infection Noted on last.  No evidence of bacterial coinfection.  Procalcitonin negative Plan: Respiratory precautions Steroids as below Oxygen as necessary (not requiring it) Bronchodilators    Acute asthma/COPD exacerbation, failed outpatient management Likely triggered by viral syndrome Plan: Continue steroids, transition to oral prednisone Bronchodilators Incentive spirometry and flutter valve   Hypokalemia resolved   Hypernatremia Suspect 2/2 dehydration resolved   HTN controlled -Continue amlodipine   PAF -Rate controlled A-fib on EKG -Not on any rate control medications -Continue Eliquis   RA -Poorly controlled, continue DMARDs of  hydroxychloroquine and weekly methotrexate -Continue as needed narcotics  GERD Patient endorsed symptomatic gerd to slp today Will start ppi  Debility Pt affirms snf recommendation, patient agreeable TOC consulted for placement   DVT prophylaxis: Eliquis Code Status: Full Family Communication: daughter updated @ bedside 2/12 Disposition Plan: snf, toc consulted    Level of care: Telemetry Medical  Consultants:  None  Procedures:  None  Antimicrobials: None    Subjective: Seen and examined.  Resting in bed.  Still with coughing fits, fatigued.   Objective: Vitals:   01/29/24 1955 01/30/24 0511 01/30/24 0749 01/30/24 0759  BP:  139/70 (!) 131/58   Pulse:  62 74   Resp:  16 17   Temp:  98 F (36.7 C) 97.9 F (36.6 C)   TempSrc:  Oral    SpO2: 95% 95% 94% 95%    Intake/Output Summary (Last 24 hours) at 01/30/2024 1509 Last data filed at 01/30/2024 8413 Gross per 24 hour  Intake --  Output 1200 ml  Net -1200 ml   There were no vitals filed for this visit.  Examination:  General exam: Appears calm and comfortable  Respiratory system: normal wob, clear Cardiovascular system: S1-S2, regular rate, irregular rhythm, no murmurs, no pedal edema Gastrointestinal system: Soft, NT/ND, obese Central nervous system: Alert and oriented. No focal neurological deficits. Extremities: Decreased power bilateral lower extremities.  Gait not assessed Skin: No rashes, lesions or ulcers Psychiatry: Judgement and insight appear normal. Mood & affect appropriate.     Data Reviewed: I have personally reviewed following labs and imaging studies  CBC: Recent Labs  Lab 01/28/24 1252  WBC 4.9  NEUTROABS 3.8  HGB 14.1  HCT  43.8  MCV 99.1  PLT 149*   Basic Metabolic Panel: Recent Labs  Lab 01/28/24 1252 01/29/24 0702  NA 147* 142  K 3.4* 4.4  CL 107 106  CO2 28 25  GLUCOSE 90 129*  BUN 19 24*  CREATININE 1.00 1.06*  CALCIUM 8.7* 7.9*   GFR: CrCl cannot be  calculated (Unknown ideal weight.). Liver Function Tests: No results for input(s): "AST", "ALT", "ALKPHOS", "BILITOT", "PROT", "ALBUMIN" in the last 168 hours. No results for input(s): "LIPASE", "AMYLASE" in the last 168 hours. No results for input(s): "AMMONIA" in the last 168 hours. Coagulation Profile: No results for input(s): "INR", "PROTIME" in the last 168 hours. Cardiac Enzymes: No results for input(s): "CKTOTAL", "CKMB", "CKMBINDEX", "TROPONINI" in the last 168 hours. BNP (last 3 results) No results for input(s): "PROBNP" in the last 8760 hours. HbA1C: No results for input(s): "HGBA1C" in the last 72 hours. CBG: No results for input(s): "GLUCAP" in the last 168 hours. Lipid Profile: No results for input(s): "CHOL", "HDL", "LDLCALC", "TRIG", "CHOLHDL", "LDLDIRECT" in the last 72 hours. Thyroid Function Tests: No results for input(s): "TSH", "T4TOTAL", "FREET4", "T3FREE", "THYROIDAB" in the last 72 hours. Anemia Panel: No results for input(s): "VITAMINB12", "FOLATE", "FERRITIN", "TIBC", "IRON", "RETICCTPCT" in the last 72 hours. Sepsis Labs: Recent Labs  Lab 01/28/24 1252  PROCALCITON <0.10    Recent Results (from the past 240 hours)  Respiratory (~20 pathogens) panel by PCR     Status: Abnormal   Collection Time: 01/28/24  5:26 PM   Specimen: Nasopharyngeal Swab; Respiratory  Result Value Ref Range Status   Adenovirus NOT DETECTED NOT DETECTED Final   Coronavirus 229E NOT DETECTED NOT DETECTED Final    Comment: (NOTE) The Coronavirus on the Respiratory Panel, DOES NOT test for the novel  Coronavirus (2019 nCoV)    Coronavirus HKU1 NOT DETECTED NOT DETECTED Final   Coronavirus NL63 NOT DETECTED NOT DETECTED Final   Coronavirus OC43 NOT DETECTED NOT DETECTED Final   Metapneumovirus DETECTED (A) NOT DETECTED Final   Rhinovirus / Enterovirus NOT DETECTED NOT DETECTED Final   Influenza A NOT DETECTED NOT DETECTED Final   Influenza B NOT DETECTED NOT DETECTED Final    Parainfluenza Virus 1 NOT DETECTED NOT DETECTED Final   Parainfluenza Virus 2 NOT DETECTED NOT DETECTED Final   Parainfluenza Virus 3 NOT DETECTED NOT DETECTED Final   Parainfluenza Virus 4 NOT DETECTED NOT DETECTED Final   Respiratory Syncytial Virus NOT DETECTED NOT DETECTED Final   Bordetella pertussis NOT DETECTED NOT DETECTED Final   Bordetella Parapertussis NOT DETECTED NOT DETECTED Final   Chlamydophila pneumoniae NOT DETECTED NOT DETECTED Final   Mycoplasma pneumoniae NOT DETECTED NOT DETECTED Final    Comment: Performed at [facility], [address], [place], [place] [address]         Radiology Studies: No results found.       Scheduled Meds:  amLODipine  10 mg Oral Daily   apixaban  5 mg Oral BID   arformoterol  15 mcg Nebulization BID   atorvastatin  5 mg Oral QHS   guaiFENesin  1,200 mg Oral BID   hydroxychloroquine  200 mg Oral BID   hydrOXYzine  50 mg Oral QHS   ipratropium-albuterol  3 mL Nebulization BID   lidocaine  1 patch Transdermal Q24H   loratadine  10 mg Oral Daily   [START ON 01/31/2024] methotrexate  15 mg Oral Weekly   methylPREDNISolone (SOLU-MEDROL) injection  40 mg Intravenous Q12H  PARoxetine  30 mg Oral Daily   potassium chloride  10 mEq Oral Daily   Continuous Infusions:   LOS: 0 days    Silvano Bilis, MD [facility]   If 7PM-7AM, please contact night-coverage  01/30/2024, 3:09 PM

## 2024-01-31 DIAGNOSIS — J208 Acute bronchitis due to other specified organisms: Secondary | ICD-10-CM

## 2024-01-31 DIAGNOSIS — B9781 Human metapneumovirus as the cause of diseases classified elsewhere: Secondary | ICD-10-CM | POA: Diagnosis not present

## 2024-01-31 LAB — BASIC METABOLIC PANEL
Anion gap: 8 (ref 5–15)
BUN: 29 mg/dL — ABNORMAL HIGH (ref 8–23)
CO2: 25 mmol/L (ref 22–32)
Calcium: 8.2 mg/dL — ABNORMAL LOW (ref 8.9–10.3)
Chloride: 109 mmol/L (ref 98–111)
Creatinine, Ser: 1.01 mg/dL — ABNORMAL HIGH (ref 0.44–1.00)
GFR, Estimated: 60 mL/min — ABNORMAL LOW (ref >60)
Glucose, Bld: 142 mg/dL — ABNORMAL HIGH (ref 70–99)
Potassium: 4.6 mmol/L (ref 3.5–5.1)
Sodium: 142 mmol/L (ref 135–145)

## 2024-01-31 LAB — GLUCOSE, CAPILLARY: Glucose-Capillary: 177 mg/dL — ABNORMAL HIGH (ref 70–99)

## 2024-01-31 MED ORDER — OXYCODONE HCL 5 MG PO TABS
2.5000 mg | ORAL_TABLET | ORAL | Status: DC | PRN
Start: 2024-01-31 — End: 2024-02-01
  Administered 2024-01-31: 2.5 mg via ORAL
  Administered 2024-02-01: 5 mg via ORAL
  Filled 2024-01-31 (×2): qty 1

## 2024-01-31 NOTE — TOC Initial Note (Signed)
Transition of Care [facility]) - Initial/Assessment Note    Patient Details  Name: [name] MRN: [id number] Date of Birth: July 25, 1951  Transition of Care [facility]) CM/SW Contact:    Chapman Fitch, RN Phone Number: 01/31/2024, 2:56 PM  Clinical Narrative:                  Admitted WUJ:[id number] Admitted from: home alone NWG:[id number]  Current home health/prior home health/DME: RW  Therapy recommending SNF.  Patient in agreement to bed search. Patient is hopeful that she will improve enough to go home.  Existing PASRR Fl2 sent for signature Bed search initiated          Patient Goals and CMS Choice            Expected Discharge Plan and Services                                              Prior Living Arrangements/Services                       Activities of Daily Living   ADL Screening (condition at time of admission) Independently performs ADLs?: Yes (appropriate for developmental age) Is the patient deaf or have difficulty hearing?: No Does the patient have difficulty seeing, even when wearing glasses/contacts?: Yes Does the patient have difficulty concentrating, remembering, or making decisions?: No  Permission Sought/Granted                  Emotional Assessment              Admission diagnosis:  COPD exacerbation (HCC) [J44.1] Asthma [J45.909] Patient Active Problem List   Diagnosis Date Noted   Acute bronchitis due to human metapneumovirus 01/30/2024   Obesity 01/30/2024   Asthma, chronic obstructive, with acute exacerbation (HCC) 01/28/2024   Asthma 01/28/2024   Status post total knee replacement using cement, right 10/24/2022   Status post total knee replacement using cement, left 10/18/2021   Anemia 07/07/2021   Mild protein-calorie malnutrition (HCC) 07/07/2021   Ulcer of great toe (HCC) 03/17/2021   Lymphedema 03/17/2021   Hypokalemia 11/02/2020   Hypomagnesemia 11/02/2020   Paroxysmal A-fib (HCC) 10/21/2020    Hyperlipidemia, mixed 10/21/2020   Chronic right shoulder pain 09/14/2020   Dysuria 09/14/2020   Fall 09/14/2020   Leukocytosis 09/14/2020   Dizziness 09/13/2020   Weakness 09/13/2020   Primary osteoarthritis of both knees 03/19/2017   Degenerative arthritis of right knee 07/23/2014   Allergic rhinitis 07/22/2014   Anxiety 07/22/2014   Benign essential hypertension 07/22/2014   GERD (gastroesophageal reflux disease) 07/22/2014   PCP:  Marina Goodell, MD Pharmacy:   [facility] [phone] - [place], [place] - [address] [place] [address] Phone: [phone] Fax: [phone]     Social Drivers of Health (SDOH) Social History: SDOH Screenings   Food Insecurity: No Food Insecurity (01/29/2024)  Housing: Unknown (01/29/2024)  Transportation Needs: No Transportation Needs (01/29/2024)  Utilities: At Risk (01/29/2024)  Financial Resource Strain: Medium Risk (08/09/2023)   Received from [facility]  Social Connections: Patient Declined (01/29/2024)  Tobacco Use: Low Risk  (01/28/2024)   SDOH Interventions:     Readmission Risk Interventions     No data to display

## 2024-01-31 NOTE — Progress Notes (Signed)
Mobility Specialist - Progress Note    01/31/24 1147  Mobility  Activity Ambulated with assistance in hallway  Level of Assistance Standby assist, set-up cues, supervision of patient - no hands on  Assistive Device Front wheel walker  Distance Ambulated (ft) 70 ft  Range of Motion/Exercises Active  Activity Response Tolerated well  Mobility Referral Yes  Mobility visit 1 Mobility  Mobility Specialist Start Time (ACUTE ONLY) 1129  Mobility Specialist Stop Time (ACUTE ONLY) 1146  Mobility Specialist Time Calculation (min) (ACUTE ONLY) 17 min   Pt resting in recliner on RA upon entry. Pt STS and ambulates to hallway SBA with RW. Pt gait was steady with no LOB but author utilized gait belt due to pt endorsing feeling shaking in pt mobility bouts to bathroom. Pt endorses SOB after 50 ft and persistent cough throughout session. Pt returned to recliner and left with needs in reach. Pt chair alarm activated.   Johnathan Hausen Mobility Specialist 01/31/24, 12:07 PM

## 2024-01-31 NOTE — Plan of Care (Signed)

## 2024-01-31 NOTE — Plan of Care (Signed)
Patient alert and oriented. Continues to complain of general malaise. Encouraged to call for assistance when needed. Contact precautions maintained. Will continue to monitor.   Problem: Clinical Measurements: Goal: Ability to maintain clinical measurements within normal limits will improve Outcome: Progressing   Problem: Clinical Measurements: Goal: Diagnostic test results will improve Outcome: Progressing   Problem: Clinical Measurements: Goal: Respiratory complications will improve Outcome: Progressing

## 2024-01-31 NOTE — Progress Notes (Signed)
PROGRESS NOTE    [name]  ZOX:[id number] DOB: [birth year] DOA: 01/28/2024 PCP: Marina Goodell, MD    Brief Narrative:  73 y.o. female with medical history significant of asthma/COPD, PAF on Eliquis, HTN, HLD, GERD, rheumatoid arthritis on DMARDs, presented with persistent cough wheezing shortness of breath.   Patient started to have URI like symptoms about 5 days ago, symptoms including runny nose congestions sore throat, soon patient started develop dry cough and wheezing.  She denied any fever or chills but she attributed to that she has been taking Tylenol every 8 hours for the poorly controlled RA joint pains in her knees.  3 days ago she went to urgent care, when she was diagnosed with acute bronchitis, patient was sent home with doxycycline and prednisone.  So far patient has able to take only 2 doses of those medications but she continued to experience worsening of dry cough wheezing and exertional dyspnea.  Today she feels extremely shortness of breath even at rest.  Denies any chest pain, no nauseous vomiting no abdominal pain no diarrhea.  She is a never smoker   Assessment & Plan:   Principal Problem:   Acute bronchitis due to human metapneumovirus Active Problems:   Paroxysmal A-fib (HCC)   Benign essential hypertension   GERD (gastroesophageal reflux disease)   Asthma, chronic obstructive, with acute exacerbation (HCC)   Asthma   Obesity  Metapneumovirus infection  No evidence of bacterial coinfection.  Procalcitonin negative Plan: Respiratory precautions Steroids as below Not requiring o2 Bronchodilators   Acute asthma/COPD exacerbation, failed outpatient management Likely triggered by viral syndrome Plan: Continue steroids, transitioned now to oral prednisone Bronchodilators Incentive spirometry and flutter valve   Hypokalemia resolved   Hypernatremia Suspect 2/2 dehydration resolved   HTN controlled -Continue amlodipine   PAF -Rate  controlled A-fib on EKG -Not on any rate control medications -Continue Eliquis   RA -Poorly controlled, continue DMARDs of hydroxychloroquine and weekly methotrexate -Continue as needed narcotics  GERD Patient endorsed symptomatic gerd to slp today Have started ppi  Debility Pt affirms snf recommendation, patient agreeable TOC consulted for placement   DVT prophylaxis: Eliquis Code Status: Full Family Communication: daughter updated @ bedside 2/12 no answer when telephoned today, message left Disposition Plan: snf, toc consulted    Level of care: Telemetry Medical  Consultants:  None  Procedures:  None  Antimicrobials: None    Subjective: Seen and examined.  Resting in chair. Says dyspnea and coughing are slowly improving.  Objective: Vitals:   01/30/24 2127 01/30/24 2200 01/31/24 0754 01/31/24 0815  BP:  136/64  (!) 155/66  Pulse:  76  67  Resp:  18  17  Temp:  97.7 F (36.5 C)  (!) 97.5 F (36.4 C)  TempSrc:  Oral    SpO2: 95% 96% 96% 96%    Intake/Output Summary (Last 24 hours) at 01/31/2024 1225 Last data filed at 01/31/2024 0400 Gross per 24 hour  Intake --  Output 450 ml  Net -450 ml   There were no vitals filed for this visit.  Examination:  General exam: Appears calm and comfortable  Respiratory system: normal wob, clear Cardiovascular system: S1-S2, regular rate, irregular rhythm, no murmurs, no pedal edema Gastrointestinal system: Soft, NT/ND, obese Central nervous system: Alert and oriented. No focal neurological deficits. Extremities: Decreased power bilateral lower extremities.  Gait not assessed Skin: No rashes, lesions or ulcers Psychiatry: Judgement and insight appear normal. Mood & affect appropriate.     Data  Reviewed: I have personally reviewed following labs and imaging studies  CBC: Recent Labs  Lab 01/28/24 1252  WBC 4.9  NEUTROABS 3.8  HGB 14.1  HCT 43.8  MCV 99.1  PLT 149*   Basic Metabolic Panel: Recent  Labs  Lab 01/28/24 1252 01/29/24 0702 01/31/24 0537  NA 147* 142 142  K 3.4* 4.4 4.6  CL 107 106 109  CO2 28 25 25   GLUCOSE 90 129* 142*  BUN 19 24* 29*  CREATININE 1.00 1.06* 1.01*  CALCIUM 8.7* 7.9* 8.2*   GFR: CrCl cannot be calculated (Unknown ideal weight.). Liver Function Tests: No results for input(s): "AST", "ALT", "ALKPHOS", "BILITOT", "PROT", "ALBUMIN" in the last 168 hours. No results for input(s): "LIPASE", "AMYLASE" in the last 168 hours. No results for input(s): "AMMONIA" in the last 168 hours. Coagulation Profile: No results for input(s): "INR", "PROTIME" in the last 168 hours. Cardiac Enzymes: No results for input(s): "CKTOTAL", "CKMB", "CKMBINDEX", "TROPONINI" in the last 168 hours. BNP (last 3 results) No results for input(s): "PROBNP" in the last 8760 hours. HbA1C: No results for input(s): "HGBA1C" in the last 72 hours. CBG: Recent Labs  Lab 01/31/24 1110  GLUCAP 177*   Lipid Profile: No results for input(s): "CHOL", "HDL", "LDLCALC", "TRIG", "CHOLHDL", "LDLDIRECT" in the last 72 hours. Thyroid Function Tests: No results for input(s): "TSH", "T4TOTAL", "FREET4", "T3FREE", "THYROIDAB" in the last 72 hours. Anemia Panel: No results for input(s): "VITAMINB12", "FOLATE", "FERRITIN", "TIBC", "IRON", "RETICCTPCT" in the last 72 hours. Sepsis Labs: Recent Labs  Lab 01/28/24 1252  PROCALCITON <0.10    Recent Results (from the past 240 hours)  Respiratory (~20 pathogens) panel by PCR     Status: Abnormal   Collection Time: 01/28/24  5:26 PM   Specimen: Nasopharyngeal Swab; Respiratory  Result Value Ref Range Status   Adenovirus NOT DETECTED NOT DETECTED Final   Coronavirus 229E NOT DETECTED NOT DETECTED Final    Comment: (NOTE) The Coronavirus on the Respiratory Panel, DOES NOT test for the novel  Coronavirus (2019 nCoV)    Coronavirus HKU1 NOT DETECTED NOT DETECTED Final   Coronavirus NL63 NOT DETECTED NOT DETECTED Final   Coronavirus OC43 NOT  DETECTED NOT DETECTED Final   Metapneumovirus DETECTED (A) NOT DETECTED Final   Rhinovirus / Enterovirus NOT DETECTED NOT DETECTED Final   Influenza A NOT DETECTED NOT DETECTED Final   Influenza B NOT DETECTED NOT DETECTED Final   Parainfluenza Virus 1 NOT DETECTED NOT DETECTED Final   Parainfluenza Virus 2 NOT DETECTED NOT DETECTED Final   Parainfluenza Virus 3 NOT DETECTED NOT DETECTED Final   Parainfluenza Virus 4 NOT DETECTED NOT DETECTED Final   Respiratory Syncytial Virus NOT DETECTED NOT DETECTED Final   Bordetella pertussis NOT DETECTED NOT DETECTED Final   Bordetella Parapertussis NOT DETECTED NOT DETECTED Final   Chlamydophila pneumoniae NOT DETECTED NOT DETECTED Final   Mycoplasma pneumoniae NOT DETECTED NOT DETECTED Final    Comment: Performed at [facility], [address] [place], [place] [address]         Radiology Studies: No results found.       Scheduled Meds:  amLODipine  10 mg Oral Daily   apixaban  5 mg Oral BID   arformoterol  15 mcg Nebulization BID   atorvastatin  5 mg Oral QHS   guaiFENesin  1,200 mg Oral BID   hydroxychloroquine  200 mg Oral BID   hydrOXYzine  50 mg Oral QHS   ipratropium-albuterol  3 mL Nebulization  BID   lidocaine  1 patch Transdermal Q24H   loratadine  10 mg Oral Daily   methotrexate  15 mg Oral Weekly   methylPREDNISolone (SOLU-MEDROL) injection  40 mg Intravenous Q12H   pantoprazole  40 mg Oral Daily   PARoxetine  30 mg Oral Daily   potassium chloride  10 mEq Oral Daily   Continuous Infusions:   LOS: 0 days    Silvano Bilis, MD [facility]   If 7PM-7AM, please contact night-coverage  01/31/2024, 12:25 PM

## 2024-01-31 NOTE — Progress Notes (Signed)
 Physical Therapy Treatment Patient Details Name: [name] MRN: [id number] DOB: 06-03-1951 Today's Date: 01/31/2024   History of Present Illness presented to ER secondary to progressive cough, wheezing, SOB; admitted for management of asthma, COPD exacerbation.    PT Comments  Pt seen earlier in day, received in chair, continues to have non productive coughing spells. SpO2 remained in upper 90's while ambulating to nursing station and back with RW. No LOB during mobility, pt able to complete toilet transfers with side rail support and Supervision. Overall, she appears much improved from initial eval and feels comfortable returning to her first floor apartment. Pt can have groceries delivered if needed and currently has a RW, Rollator, and BSC. Continued PT post d/c would be beneficial per MD recs.    If plan is discharge home, recommend the following: A little help with walking and/or transfers;A little help with bathing/dressing/bathroom   Can travel by private vehicle     Yes  Equipment Recommendations  Other (comment) (Pt has a Rollator, RW, BSC and no stairs to enter)    Recommendations for Other Services       Precautions / Restrictions Precautions Precautions: Fall Restrictions Weight Bearing Restrictions Per Provider Order: No     Mobility  Bed Mobility Overal bed mobility: Modified Independent Bed Mobility: Sit to Supine       Sit to supine: Modified independent (Device/Increase time)   General bed mobility comments: Pt able to raise LE's up onto bed without difficulty    Transfers Overall transfer level: Needs assistance Equipment used: Rolling walker (2 wheels) Transfers: Sit to/from Stand Sit to Stand: Supervision           General transfer comment: Pt able to stand from low recliner on first attempt without difficulty    Ambulation/Gait Ambulation/Gait assistance: Supervision Gait Distance (Feet): 90 Feet Assistive device: Rolling walker (2  wheels) Gait Pattern/deviations: Step-to pattern, Decreased step length - right Gait velocity: Decreased     General Gait Details: Pt ambulated on RA with some fatigue, however SpO2 remained in upper 90's.   Stairs             Wheelchair Mobility     Tilt Bed    Modified Rankin (Stroke Patients Only)       Balance Overall balance assessment: Needs assistance Sitting-balance support: No upper extremity supported, Feet supported Sitting balance-Leahy Scale: Good     Standing balance support: Bilateral upper extremity supported, During functional activity, Reliant on assistive device for balance Standing balance-Leahy Scale: Fair Standing balance comment: No LOB while negotiating around objects and through small spaces.                            Communication Communication Communication: No apparent difficulties  Cognition Arousal: Alert Behavior During Therapy: WFL for tasks assessed/performed   PT - Cognitive impairments: No apparent impairments                       PT - Cognition Comments: A&Ox4, very pleasant Following commands: Intact      Cueing Cueing Techniques: Verbal cues  Exercises      General Comments General comments (skin integrity, edema, etc.): Pt educated on role of PT, discussed current LOF and home set up to assist with possible transition home vs STR      Pertinent Vitals/Pain Pain Assessment Pain Assessment: 0-10 Pain Score: 3  Pain Location: Abdomen from coughing Pain  Descriptors / Indicators: Discomfort, Sore, Sharp Pain Intervention(s): Limited activity within patient's tolerance    Home Living                          Prior Function            PT Goals (current goals can now be found in the care plan section) Acute Rehab PT Goals Patient Stated Goal: to return home Progress towards PT goals: Progressing toward goals    Frequency    Min 1X/week      PT Plan       Co-evaluation              AM-PAC PT "6 Clicks" Mobility   Outcome Measure  Help needed turning from your back to your side while in a flat bed without using bedrails?: None Help needed moving from lying on your back to sitting on the side of a flat bed without using bedrails?: A Little Help needed moving to and from a bed to a chair (including a wheelchair)?: A Little Help needed standing up from a chair using your arms (e.g., wheelchair or bedside chair)?: A Little Help needed to walk in hospital room?: A Little Help needed climbing 3-5 steps with a railing? : A Little 6 Click Score: 19    End of Session Equipment Utilized During Treatment: Gait belt Activity Tolerance: Patient tolerated treatment well Patient left: in bed;with bed alarm set;with call bell/phone within reach Nurse Communication: Mobility status PT Visit Diagnosis: Muscle weakness (generalized) (M62.81);Difficulty in walking, not elsewhere classified (R26.2)     Time: 2355-7322 PT Time Calculation (min) (ACUTE ONLY): 31 min  Charges:    $Gait Training: 8-22 mins $Therapeutic Activity: 8-22 mins PT General Charges $$ ACUTE PT VISIT: 1 Visit                    Zadie Cleverly, PTA  Jannet Askew 01/31/2024, 4:39 PM

## 2024-01-31 NOTE — [place] FL2 (Signed)
 [place] MEDICAID FL2 LEVEL OF CARE FORM     IDENTIFICATION  Patient Name: [name] Birthdate: [birth year] Sex: female Admission Date (Current Location): 01/28/2024  [facility] and [place] Number:  Chiropodist and Address:         Provider Number: [id number]  Attending Physician Name and Address:  Kathrynn Running, MD  Relative Name and Phone Number:       Current Level of Care: Hospital Recommended Level of Care: Skilled Nursing Facility Prior Approval Number:    Date Approved/Denied:   PASRR Number: [id number]  Discharge Plan: SNF    Current Diagnoses: Patient Active Problem List   Diagnosis Date Noted   Acute bronchitis due to human metapneumovirus 01/30/2024   Obesity 01/30/2024   Asthma, chronic obstructive, with acute exacerbation (HCC) 01/28/2024   Asthma 01/28/2024   Status post total knee replacement using cement, right 10/24/2022   Status post total knee replacement using cement, left 10/18/2021   Anemia 07/07/2021   Mild protein-calorie malnutrition (HCC) 07/07/2021   Ulcer of great toe (HCC) 03/17/2021   Lymphedema 03/17/2021   Hypokalemia 11/02/2020   Hypomagnesemia 11/02/2020   Paroxysmal A-fib (HCC) 10/21/2020   Hyperlipidemia, mixed 10/21/2020   Chronic right shoulder pain 09/14/2020   Dysuria 09/14/2020   Fall 09/14/2020   Leukocytosis 09/14/2020   Dizziness 09/13/2020   Weakness 09/13/2020   Primary osteoarthritis of both knees 03/19/2017   Degenerative arthritis of right knee 07/23/2014   Allergic rhinitis 07/22/2014   Anxiety 07/22/2014   Benign essential hypertension 07/22/2014   GERD (gastroesophageal reflux disease) 07/22/2014    Orientation RESPIRATION BLADDER Height & Weight     Self, Time, Situation, Place  Normal Continent Weight:   Height:     BEHAVIORAL SYMPTOMS/MOOD NEUROLOGICAL BOWEL NUTRITION STATUS      Continent Diet (Heart Healthy)  AMBULATORY STATUS COMMUNICATION OF NEEDS Skin   Limited Assist  Verbally Bruising                       Personal Care Assistance Level of Assistance              Functional Limitations Info             SPECIAL CARE FACTORS FREQUENCY  PT (By licensed PT), OT (By licensed OT)                    Contractures Contractures Info: Not present    Additional Factors Info  Code Status, Allergies, Isolation Precautions Code Status Info: Full Allergies Info: Sulfa Antibiotics, Leflunomide, Amoxicillin-pot Clavulanate, Valdecoxib     Isolation Precautions Info: contact     Current Medications (01/31/2024):  This is the current hospital active medication list Current Facility-Administered Medications  Medication Dose Route Frequency Provider Last Rate Last Admin   acetaminophen (TYLENOL) tablet 500-1,000 mg  500-1,000 mg Oral Q6H PRN Mikey College T, MD   500 mg at 01/29/24 1420   amLODipine (NORVASC) tablet 10 mg  10 mg Oral Daily Mikey College T, MD   10 mg at 01/31/24 1101   apixaban (ELIQUIS) tablet 5 mg  5 mg Oral BID Mikey College T, MD   5 mg at 01/31/24 1101   arformoterol (BROVANA) nebulizer solution 15 mcg  15 mcg Nebulization BID Lolita Patella B, MD   15 mcg at 01/31/24 0754   atorvastatin (LIPITOR) tablet 5 mg  5 mg Oral QHS Mikey College T, MD   5 mg at 01/30/24  2234   benzonatate (TESSALON) capsule 100 mg  100 mg Oral TID PRN Mikey College T, MD   100 mg at 01/31/24 1427   guaiFENesin (MUCINEX) 12 hr tablet 1,200 mg  1,200 mg Oral BID Mikey College T, MD   1,200 mg at 01/31/24 1101   hydroxychloroquine (PLAQUENIL) tablet 200 mg  200 mg Oral BID Mikey College T, MD   200 mg at 01/31/24 1101   hydrOXYzine (ATARAX) tablet 50 mg  50 mg Oral QHS Mila Merry A, RPH   50 mg at 01/30/24 2234   ipratropium-albuterol (DUONEB) 0.5-2.5 (3) MG/3ML nebulizer solution 3 mL  3 mL Nebulization BID Lolita Patella B, MD   3 mL at 01/31/24 0754   lidocaine (LIDODERM) 5 % 1 patch  1 patch Transdermal Q24H Mikey College T, MD   1 patch at 01/30/24 1858    loratadine (CLARITIN) tablet 10 mg  10 mg Oral Daily Mikey College T, MD   10 mg at 01/31/24 1101   methotrexate (RHEUMATREX) tablet 15 mg  15 mg Oral Weekly Nazari, Walid A, RPH   15 mg at 01/31/24 1101   methylPREDNISolone sodium succinate (SOLU-MEDROL) 40 mg/mL injection 40 mg  40 mg Intravenous Q12H Mikey College T, MD   40 mg at 01/31/24 1101   ondansetron (ZOFRAN) tablet 4 mg  4 mg Oral Q6H PRN Mikey College T, MD       oxyCODONE (Oxy IR/ROXICODONE) immediate release tablet 2.5-5 mg  2.5-5 mg Oral Q4H PRN Wouk, Wilfred Curtis, MD       pantoprazole (PROTONIX) EC tablet 40 mg  40 mg Oral Daily Kathrynn Running, MD   40 mg at 01/31/24 1101   PARoxetine (PAXIL) tablet 30 mg  30 mg Oral Daily Mila Merry A, RPH   30 mg at 01/31/24 1101   potassium chloride (KLOR-CON M) CR tablet 10 mEq  10 mEq Oral Daily Mila Merry A, RPH   10 mEq at 01/31/24 1101     Discharge Medications: Please see discharge summary for a list of discharge medications.  Relevant Imaging Results:  Relevant Lab Results:   Additional Information SS# [id number]  Chapman Fitch, RN

## 2024-02-01 ENCOUNTER — Other Ambulatory Visit: Payer: Self-pay

## 2024-02-01 DIAGNOSIS — B9781 Human metapneumovirus as the cause of diseases classified elsewhere: Secondary | ICD-10-CM | POA: Diagnosis not present

## 2024-02-01 DIAGNOSIS — J208 Acute bronchitis due to other specified organisms: Secondary | ICD-10-CM | POA: Diagnosis not present

## 2024-02-01 MED ORDER — PREDNISONE 20 MG PO TABS
40.0000 mg | ORAL_TABLET | Freq: Every day | ORAL | 0 refills | Status: AC
  Filled 2024-02-01: qty 6, 3d supply, fill #0

## 2024-02-01 NOTE — Plan of Care (Signed)

## 2024-02-01 NOTE — Discharge Summary (Signed)
[name]:[id number] DOB: 09/25/1951 DOA: 01/28/2024  PCP: Marina Goodell, MD  Admit date: 01/28/2024 Discharge date: 02/01/2024  Time spent: 35 minutes  Recommendations for Outpatient Follow-up:  Pcp f/u, consider re-start of methotrexate then     Discharge Diagnoses:  Principal Problem:   Acute bronchitis due to human metapneumovirus Active Problems:   Paroxysmal A-fib (HCC)   Benign essential hypertension   GERD (gastroesophageal reflux disease)   Asthma, chronic obstructive, with acute exacerbation (HCC)   Asthma   Obesity   Discharge Condition: improved  Diet recommendation: heart healthy  There were no vitals filed for this visit.  History of present illness:  From admission h and p  [name] is a 73 y.o. female with medical history significant of asthma/COPD, PAF on Eliquis, HTN, HLD, GERD, rheumatoid arthritis on DMARDs, presented with persistent cough wheezing shortness of breath.   Patient started to have URI like symptoms about 5 days ago, symptoms including runny nose congestions sore throat, soon patient started develop dry cough and wheezing.  She denied any fever or chills but she attributed to that she has been taking Tylenol every 8 hours for the poorly controlled RA joint pains in her knees.  3 days ago she went to urgent care, when she was diagnosed with acute bronchitis, patient was sent home with doxycycline and prednisone.  So far patient has able to take only 2 doses of those medications but she continued to experience worsening of dry cough wheezing and exertional dyspnea.  Today she feels extremely shortness of breath even at rest.  Denies any chest pain, no nauseous vomiting no abdominal pain no diarrhea.  She is a never smoker  Hospital Course:  Patient presents with cough and shortness of breath. Found to have copd exacerbation and viral testing positive for metapneumovirus. Initially required o2 but that has been weaned off. Was  treated with corticosteroids. PT initially advised SNF but patient progressed and rec is now home with home health which has been ordered. Advised hold home methotrexate until outpatient f/u, should be safe to resume once infection clears. Other chronic medical problems stable.   Procedures: none   Consultations: none  Discharge Exam: Vitals:   01/31/24 2201 02/01/24 0833  BP: 135/60 (!) 118/104  Pulse: 75 78  Resp: 18 16  Temp: 98.7 F (37.1 C) 98 F (36.7 C)  SpO2: 93% 97%    General: NAD Cardiovascular: RRR Respiratory: clear, normal wob  Discharge Instructions   Discharge Instructions     Diet - low sodium heart healthy   Complete by: As directed    Increase activity slowly   Complete by: As directed       Allergies as of 02/01/2024       Reactions   Sulfa Antibiotics Hives, Itching   Leflunomide Itching   Amoxicillin-pot Clavulanate Nausea Only   Valdecoxib Rash        Medication List     STOP taking these medications    methotrexate 2.5 MG tablet Commonly known as: RHEUMATREX       TAKE these medications    acetaminophen 500 MG tablet Commonly known as: TYLENOL Take 1-2 tablets (500-1,000 mg total) by mouth every 6 (six) hours as needed for mild pain (pain score 1-3 or temp > 100.5).   albuterol 108 (90 Base) MCG/ACT inhaler Commonly known as: VENTOLIN HFA Inhale 2 puffs into the lungs every 6 (six) hours as needed for wheezing or shortness of breath.  amLODipine 10 MG tablet Commonly known as: NORVASC Take 10 mg by mouth daily.   apixaban 5 MG Tabs tablet Commonly known as: ELIQUIS Take 5 mg by mouth 2 (two) times daily.   atorvastatin 10 MG tablet Commonly known as: LIPITOR Take 5 mg by mouth at bedtime.   budesonide-formoterol 80-4.5 MCG/ACT inhaler Commonly known as: SYMBICORT Inhale 2 puffs into the lungs 2 (two) times daily as needed (shortness of breath or wheezing).   cyclobenzaprine 5 MG tablet Commonly known as:  FLEXERIL Take 1 tablet (5 mg total) by mouth 3 (three) times daily as needed for muscle spasms.   docusate sodium 100 MG capsule Commonly known as: COLACE Take 1 capsule (100 mg total) by mouth 2 (two) times daily.   ferrous sulfate 325 (65 FE) MG tablet Take 325 mg by mouth daily with breakfast.   folic acid 1 MG tablet Commonly known as: FOLVITE Take 1 mg by mouth daily.   hydroxychloroquine 200 MG tablet Commonly known as: PLAQUENIL Take 200 mg by mouth 2 (two) times daily.   hydrOXYzine 25 MG tablet Commonly known as: ATARAX Take 25 mg by mouth daily. At HS - 2 tablets   loratadine 10 MG tablet Commonly known as: CLARITIN Take 10 mg by mouth daily.   mometasone 0.1 % ointment Commonly known as: ELOCON Apply topically daily.   ondansetron 4 MG tablet Commonly known as: ZOFRAN Take 1 tablet (4 mg total) by mouth every 6 (six) hours as needed for nausea.   oxyCODONE 5 MG immediate release tablet Commonly known as: Oxy IR/ROXICODONE Take 1-2 tablets (5-10 mg total) by mouth every 4 (four) hours as needed for severe pain.   PARoxetine 30 MG tablet Commonly known as: PAXIL Take 30 mg by mouth daily.   potassium chloride 10 MEQ tablet Commonly known as: KLOR-CON Take 10 mEq by mouth daily.   predniSONE 20 MG tablet Commonly known as: DELTASONE Take 2 tablets (40 mg total) by mouth daily with breakfast for 3 days.   triamcinolone 0.025 % cream Commonly known as: KENALOG Apply 1 Application topically daily.   triamcinolone cream 0.1 % Commonly known as: KENALOG Apply 1 Application topically 2 (two) times daily.       Allergies  Allergen Reactions   Sulfa Antibiotics Hives and Itching   Leflunomide Itching   Amoxicillin-Pot Clavulanate Nausea Only   Valdecoxib Rash    Follow-up Information     Feldpausch, Madaline Guthrie, MD Follow up.   Specialty: Family Medicine Contact information: [address] [place] [address] [phone]                   The results of significant diagnostics from this hospitalization (including imaging, microbiology, ancillary and laboratory) are listed below for reference.    Significant Diagnostic Studies: DG Chest 2 View Result Date: 01/28/2024 CLINICAL DATA:  cough, fever, r/o pna. EXAM: CHEST - 2 VIEW COMPARISON:  07/18/2010. FINDINGS: There are increased interstitial markings throughout bilateral lungs. Findings are nonspecific and differential diagnosis includes underlying interstitial lung disease, pulmonary edema, atypical pneumonia, etc. Correlate clinically to determine the need for additional imaging with chest CT scan. No dense consolidation or lung collapse. Bilateral costophrenic angles are clear. Stable cardio-mediastinal silhouette. No acute osseous abnormalities. The soft tissues are within normal limits. IMPRESSION: *Increased interstitial markings, as described above. Electronically Signed   By: Jules Schick M.D.   On: 01/28/2024 15:53    Microbiology: Recent Results (from the past 240 hours)  Respiratory (~20 pathogens) panel by PCR     Status: Abnormal   Collection Time: 01/28/24  5:26 PM   Specimen: Nasopharyngeal Swab; Respiratory  Result Value Ref Range Status   Adenovirus NOT DETECTED NOT DETECTED Final   Coronavirus 229E NOT DETECTED NOT DETECTED Final    Comment: (NOTE) The Coronavirus on the Respiratory Panel, DOES NOT test for the novel  Coronavirus (2019 nCoV)    Coronavirus HKU1 NOT DETECTED NOT DETECTED Final   Coronavirus NL63 NOT DETECTED NOT DETECTED Final   Coronavirus OC43 NOT DETECTED NOT DETECTED Final   Metapneumovirus DETECTED (A) NOT DETECTED Final   Rhinovirus / Enterovirus NOT DETECTED NOT DETECTED Final   Influenza A NOT DETECTED NOT DETECTED Final   Influenza B NOT DETECTED NOT DETECTED Final   Parainfluenza Virus 1 NOT DETECTED NOT DETECTED Final   Parainfluenza Virus 2 NOT DETECTED NOT DETECTED Final   Parainfluenza Virus 3 NOT DETECTED NOT  DETECTED Final   Parainfluenza Virus 4 NOT DETECTED NOT DETECTED Final   Respiratory Syncytial Virus NOT DETECTED NOT DETECTED Final   Bordetella pertussis NOT DETECTED NOT DETECTED Final   Bordetella Parapertussis NOT DETECTED NOT DETECTED Final   Chlamydophila pneumoniae NOT DETECTED NOT DETECTED Final   Mycoplasma pneumoniae NOT DETECTED NOT DETECTED Final    Comment: Performed at [facility], [address] [place], [place] [address]     Labs: Basic Metabolic Panel: Recent Labs  Lab 01/28/24 1252 01/29/24 0702 01/31/24 0537  NA 147* 142 142  K 3.4* 4.4 4.6  CL 107 106 109  CO2 28 25 25   GLUCOSE 90 129* 142*  BUN 19 24* 29*  CREATININE 1.00 1.06* 1.01*  CALCIUM 8.7* 7.9* 8.2*   Liver Function Tests: No results for input(s): "AST", "ALT", "ALKPHOS", "BILITOT", "PROT", "ALBUMIN" in the last 168 hours. No results for input(s): "LIPASE", "AMYLASE" in the last 168 hours. No results for input(s): "AMMONIA" in the last 168 hours. CBC: Recent Labs  Lab 01/28/24 1252  WBC 4.9  NEUTROABS 3.8  HGB 14.1  HCT 43.8  MCV 99.1  PLT 149*   Cardiac Enzymes: No results for input(s): "CKTOTAL", "CKMB", "CKMBINDEX", "TROPONINI" in the last 168 hours. BNP: BNP (last 3 results) Recent Labs    06/19/23 1143  BNP 242.0*    ProBNP (last 3 results) No results for input(s): "PROBNP" in the last 8760 hours.  CBG: Recent Labs  Lab 01/31/24 1110  GLUCAP 177*       Signed:  Silvano Bilis MD.  [facility] 02/01/2024, 10:30 AM

## 2024-02-01 NOTE — Plan of Care (Signed)
Patient stated that she feels better today. Patient appears to be feeling better than previous night. Medicated for cough with positive effect. Patient slept well. Will continue to monitor.   Problem: Clinical Measurements: Goal: Ability to maintain clinical measurements within normal limits will improve Outcome: Progressing   Problem: Clinical Measurements: Goal: Respiratory complications will improve Outcome: Progressing   Problem: Pain Managment: Goal: General experience of comfort will improve and/or be controlled Outcome: Progressing

## 2024-05-10 ENCOUNTER — Other Ambulatory Visit: Payer: Self-pay

## 2024-05-10 ENCOUNTER — Emergency Department
Admission: EM | Admit: 2024-05-10 | Discharge: 2024-05-10 | Disposition: A | Discharge status: Home / Self Care | Attending: Emergency Medicine | Admitting: Emergency Medicine

## 2024-05-10 DIAGNOSIS — Z7901 Long term (current) use of anticoagulants: Secondary | ICD-10-CM | POA: Diagnosis not present

## 2024-05-10 DIAGNOSIS — I129 Hypertensive chronic kidney disease with stage 1 through stage 4 chronic kidney disease, or unspecified chronic kidney disease: Secondary | ICD-10-CM | POA: Diagnosis not present

## 2024-05-10 DIAGNOSIS — N189 Chronic kidney disease, unspecified: Secondary | ICD-10-CM | POA: Diagnosis not present

## 2024-05-10 DIAGNOSIS — J449 Chronic obstructive pulmonary disease, unspecified: Secondary | ICD-10-CM | POA: Diagnosis not present

## 2024-05-10 DIAGNOSIS — R21 Rash and other nonspecific skin eruption: Secondary | ICD-10-CM | POA: Diagnosis present

## 2024-05-10 MED ORDER — FAMOTIDINE 20 MG PO TABS: 20.0000 mg | ORAL_TABLET | Freq: Two times a day (BID) | ORAL | 0 refills | Status: AC

## 2024-05-10 MED ORDER — DEXAMETHASONE SODIUM PHOSPHATE 10 MG/ML IJ SOLN
10.0000 mg | Freq: Once | INTRAMUSCULAR | Status: AC
  Administered 2024-05-10: 10 mg via INTRAMUSCULAR
  Filled 2024-05-10: qty 1

## 2024-05-10 MED ORDER — FAMOTIDINE 20 MG PO TABS
40.0000 mg | ORAL_TABLET | Freq: Once | ORAL | Status: AC
  Administered 2024-05-10: 40 mg via ORAL
  Filled 2024-05-10: qty 2

## 2024-05-10 MED ORDER — PREDNISONE 10 MG (21) PO TBPK: ORAL_TABLET | ORAL | 0 refills | Status: DC

## 2024-05-10 NOTE — ED Provider Notes (Signed)
 [facility] Emergency Department Provider Note     Event Date/Time   First MD Initiated Contact with Patient 05/10/24 1708     (approximate)   History   Rash   HPI  [name] is a 73 y.o. female with a history of rheumatoid arthritis, HTN, COPD, CKD, HLD, A-fib on Eliquis , and cellulitis, presents to the ED endorsing purulent rash.  Patient endorses itchy rash to the chest, breast, and upper arms.  Symptoms have been present for the last 3 days.  She denies any recent fevers, chills, sweats.  No known exposures or allergic/irritant contacts are reported.  No FCS noted.  Physical Exam   Triage Vital Signs: ED Triage Vitals  Encounter Vitals Group     BP 05/10/24 1655 (!) 158/77     Systolic BP Percentile --      Diastolic BP Percentile --      Pulse Rate 05/10/24 1655 75     Resp 05/10/24 1655 16     Temp 05/10/24 1655 98.1 F (36.7 C)     Temp Source 05/10/24 1655 Oral     SpO2 05/10/24 1655 95 %     Weight 05/10/24 1654 218 lb 14.7 oz (99.3 kg)     Height --      Head Circumference --      Peak Flow --      Pain Score 05/10/24 1654 0     Pain Loc --      Pain Education --      Exclude from Growth Chart --     Most recent vital signs: Vitals:   05/10/24 1655  BP: (!) 158/77  Pulse: 75  Resp: 16  Temp: 98.1 F (36.7 C)  SpO2: 95%    General Awake, no distress. NAD HEENT NCAT. PERRL. EOMI. No rhinorrhea. Mucous membranes are moist.  CV:  Good peripheral perfusion.  RESP:  Normal effort.  SKIN:  Patient with erythematous macular skin changes over the anterior chest, pedunculated breast, and the intertriginous area of the upper abdomen.  Sparing of the lower arms and back noted.  No blisters, vesicles, whelps or papules noted.   ED Results / Procedures / Treatments   Labs (all labs ordered are listed, but only abnormal results are displayed) Labs Reviewed - No data to display   EKG   RADIOLOGY  No results  found.   PROCEDURES:  Critical Care performed: No  Procedures   MEDICATIONS ORDERED IN ED: Medications  dexamethasone  (DECADRON ) injection 10 mg (has no administration in time range)  famotidine  (PEPCID ) tablet 40 mg (has no administration in time range)     IMPRESSION / MDM / ASSESSMENT AND PLAN / ED COURSE  I reviewed the triage vital signs and the nursing notes.                              Differential diagnosis includes, but is not limited to, contact dermatitis, irritant dermatitis, nonpurulent cellulitis, xeroderma  Patient's presentation is most consistent with acute, uncomplicated illness.  Patient's diagnosis is consistent with nonspecific purulent skin irritation.  Patient with recent exam and workup at this time.  No evidence of concern for an infectious process.  Patient will be discharged home with prescriptions for famotidine  and prednisone  taper pack. Patient is to follow up with her primary provider as discussed, as needed or otherwise directed. Patient is given ED precautions to return to  the ED for any worsening or new symptoms.   FINAL CLINICAL IMPRESSION(S) / ED DIAGNOSES   Final diagnoses:  Rash and nonspecific skin eruption     Rx / DC Orders   ED Discharge Orders          Ordered    famotidine  (PEPCID ) 20 MG tablet  2 times daily        05/10/24 1735    predniSONE  (STERAPRED UNI-PAK 21 TAB) 10 MG (21) TBPK tablet        05/10/24 1735             Note:  This document was prepared using Dragon voice recognition software and may include unintentional dictation errors.    May Sparks, PA-C 05/10/24 1739    Marylynn Soho, MD 05/10/24 [phone]

## 2024-05-10 NOTE — ED Triage Notes (Signed)
 Itchy rash to chest and arms x 3 days.

## 2024-05-10 NOTE — Discharge Instructions (Addendum)
 Your exam is reassuring without signs of an infectious skin condition.  The source of your itching is unclear at this time.  Take the prescription meds along with your previously prescribed antihistamine (hydroxyzine ) or Benadryl  as directed.  Follow-up with your primary provider for ongoing evaluation.  Return to the ED if needed.

## 2024-07-10 LAB — COLOGUARD: COLOGUARD: POSITIVE — AB

## 2024-08-25 ENCOUNTER — Ambulatory Visit: Admitting: Anesthesiology

## 2024-08-25 ENCOUNTER — Encounter: Payer: Self-pay | Admitting: Gastroenterology

## 2024-08-25 ENCOUNTER — Encounter
Admission: RE | Disposition: A | Discharge status: Home / Self Care | Payer: Self-pay | Source: Home / Self Care | Attending: Gastroenterology

## 2024-08-25 ENCOUNTER — Ambulatory Visit
Admission: RE | Admit: 2024-08-25 | Discharge: 2024-08-25 | Disposition: A | Discharge status: Home / Self Care | Attending: Gastroenterology | Admitting: Gastroenterology

## 2024-08-25 DIAGNOSIS — E785 Hyperlipidemia, unspecified: Secondary | ICD-10-CM | POA: Insufficient documentation

## 2024-08-25 DIAGNOSIS — J4489 Other specified chronic obstructive pulmonary disease: Secondary | ICD-10-CM | POA: Diagnosis not present

## 2024-08-25 DIAGNOSIS — K219 Gastro-esophageal reflux disease without esophagitis: Secondary | ICD-10-CM | POA: Diagnosis not present

## 2024-08-25 DIAGNOSIS — F419 Anxiety disorder, unspecified: Secondary | ICD-10-CM | POA: Insufficient documentation

## 2024-08-25 DIAGNOSIS — K623 Rectal prolapse: Secondary | ICD-10-CM | POA: Insufficient documentation

## 2024-08-25 DIAGNOSIS — Z79899 Other long term (current) drug therapy: Secondary | ICD-10-CM | POA: Insufficient documentation

## 2024-08-25 DIAGNOSIS — I4891 Unspecified atrial fibrillation: Secondary | ICD-10-CM | POA: Insufficient documentation

## 2024-08-25 DIAGNOSIS — R195 Other fecal abnormalities: Secondary | ICD-10-CM | POA: Insufficient documentation

## 2024-08-25 DIAGNOSIS — I129 Hypertensive chronic kidney disease with stage 1 through stage 4 chronic kidney disease, or unspecified chronic kidney disease: Secondary | ICD-10-CM | POA: Diagnosis not present

## 2024-08-25 DIAGNOSIS — M069 Rheumatoid arthritis, unspecified: Secondary | ICD-10-CM | POA: Diagnosis not present

## 2024-08-25 DIAGNOSIS — N184 Chronic kidney disease, stage 4 (severe): Secondary | ICD-10-CM | POA: Insufficient documentation

## 2024-08-25 DIAGNOSIS — Z6841 Body Mass Index (BMI) 40.0 and over, adult: Secondary | ICD-10-CM | POA: Diagnosis not present

## 2024-08-25 DIAGNOSIS — K641 Second degree hemorrhoids: Secondary | ICD-10-CM | POA: Insufficient documentation

## 2024-08-25 DIAGNOSIS — Z1211 Encounter for screening for malignant neoplasm of colon: Secondary | ICD-10-CM | POA: Insufficient documentation

## 2024-08-25 DIAGNOSIS — E669 Obesity, unspecified: Secondary | ICD-10-CM | POA: Insufficient documentation

## 2024-08-25 DIAGNOSIS — Z7901 Long term (current) use of anticoagulants: Secondary | ICD-10-CM | POA: Diagnosis not present

## 2024-08-25 SURGERY — COLONOSCOPY
Anesthesia: General

## 2024-08-25 MED ORDER — PROPOFOL 500 MG/50ML IV EMUL
INTRAVENOUS | Status: DC | PRN
  Administered 2024-08-25: 75 ug/kg/min via INTRAVENOUS

## 2024-08-25 MED ORDER — LIDOCAINE HCL (PF) 2 % IJ SOLN
INTRAMUSCULAR | Status: AC
  Filled 2024-08-25: qty 5

## 2024-08-25 MED ORDER — DEXMEDETOMIDINE HCL IN NACL 80 MCG/20ML IV SOLN
INTRAVENOUS | Status: DC | PRN
  Administered 2024-08-25: 12 ug via INTRAVENOUS
  Administered 2024-08-25: 8 ug via INTRAVENOUS

## 2024-08-25 MED ORDER — LIDOCAINE HCL (CARDIAC) PF 100 MG/5ML IV SOSY
PREFILLED_SYRINGE | INTRAVENOUS | Status: DC | PRN
  Administered 2024-08-25: 80 mg via INTRAVENOUS

## 2024-08-25 MED ORDER — PROPOFOL 10 MG/ML IV BOLUS
INTRAVENOUS | Status: DC | PRN
  Administered 2024-08-25 (×2): 50 mg via INTRAVENOUS

## 2024-08-25 MED ORDER — SODIUM CHLORIDE 0.9 % IV SOLN: INTRAVENOUS | Status: DC

## 2024-08-25 NOTE — Transfer of Care (Signed)
 Immediate Anesthesia Transfer of Care Note  Patient: [name]  Procedure(s) Performed: COLONOSCOPY  Patient Location: PACU  Anesthesia Type:General  Level of Consciousness: sedated  Airway & Oxygen Therapy: Patient Spontanous Breathing and Patient connected to nasal cannula oxygen  Post-op Assessment: Report given to RN and Post -op Vital signs reviewed and stable  Post vital signs: Reviewed and stable  Last Vitals:  Vitals Value Taken Time  BP    Temp    Pulse    Resp    SpO2      Last Pain:  Vitals:   08/25/24 1155  PainSc: 0-No pain         Complications: No notable events documented.

## 2024-08-25 NOTE — Op Note (Signed)
 [facility] Patient Name: [name] Procedure Date: 08/25/2024 12:38 PM MRN: [id number] Account #: [id number] Date of Birth: June 28, 1951 Admit Type: Outpatient Age: 73 Room: [facility] ENDO ROOM 3 Gender: Female Note Status: Finalized Instrument Name: Colon Scope [id number] Procedure:             Colonoscopy Indications:           Positive Cologuard test Providers:             Ole Schick MD, MD Medicines:             Monitored Anesthesia Care Complications:         No immediate complications. Procedure:             Pre-Anesthesia Assessment:                        - Prior to the procedure, a History and Physical was                         performed, and patient medications and allergies were                         reviewed. The patient is competent. The risks and                         benefits of the procedure and the sedation options and                         risks were discussed with the patient. All questions                         were answered and informed consent was obtained.                         Patient identification and proposed procedure were                         verified by the physician, the nurse, the                         anesthesiologist, the anesthetist and the technician                         in the endoscopy suite. Mental Status Examination:                         alert and oriented. Airway Examination: normal                         oropharyngeal airway and neck mobility. Respiratory                         Examination: clear to auscultation. CV Examination:                         normal. Prophylactic Antibiotics: The patient does not                         require prophylactic antibiotics. Prior  Anticoagulants: The patient has taken Eliquis                          (apixaban ), last dose was 4 days prior to procedure.                         ASA Grade Assessment: III - A patient with  severe                         systemic disease. After reviewing the risks and                         benefits, the patient was deemed in satisfactory                         condition to undergo the procedure. The anesthesia                         plan was to use monitored anesthesia care (MAC).                         Immediately prior to administration of medications,                         the patient was re-assessed for adequacy to receive                         sedatives. The heart rate, respiratory rate, oxygen                         saturations, blood pressure, adequacy of pulmonary                         ventilation, and response to care were monitored                         throughout the procedure. The physical status of the                         patient was re-assessed after the procedure.                        After obtaining informed consent, the colonoscope was                         passed under direct vision. Throughout the procedure,                         the patient's blood pressure, pulse, and oxygen                         saturations were monitored continuously. The                         Colonoscope was introduced through the anus and                         advanced to the the terminal ileum, with  identification of the appendiceal orifice and IC                         valve. The colonoscopy was performed without                         difficulty. The patient tolerated the procedure well.                         The quality of the bowel preparation was good. The                         terminal ileum, ileocecal valve, appendiceal orifice,                         and rectum were photographed. Findings:      The perianal and digital rectal examinations were normal.      The terminal ileum appeared normal.      Mild rectal prolapse was present.      Internal hemorrhoids were found during endoscopy. The hemorrhoids were        Grade II (internal hemorrhoids that prolapse but reduce spontaneously). Impression:            - The examined portion of the ileum was normal.                        - Rectal prolapse.                        - Internal hemorrhoids.                        - No specimens collected. Recommendation:        - Repeat colonoscopy is not recommended due to current                         age (53 years or older) for screening purposes.                        - Return to referring physician as previously                         scheduled.                        - Discharge patient to home.                        - Resume previous diet.                        - Continue present medications.                        - Return to referring physician as previously                         scheduled. Procedure Code(s):     --- Professional ---                        [phone], Colonoscopy, flexible; diagnostic,  including                         collection of specimen(s) by brushing or washing, when                         performed (separate procedure) Diagnosis Code(s):     --- Professional ---                        K64.1, Second degree hemorrhoids                        K62.3, Rectal prolapse                        R19.5, Other fecal abnormalities CPT copyright 2022 [employer]. All rights reserved. The codes documented in this report are preliminary and upon coder review may  be revised to meet current compliance requirements. Ole Schick MD, MD 08/25/2024 1:08:04 PM Number of Addenda: 0 Note Initiated On: 08/25/2024 12:38 PM Scope Withdrawal Time: 0 hours 7 minutes 34 seconds  Total Procedure Duration: 0 hours 10 minutes 21 seconds  Estimated Blood Loss:  Estimated blood loss: none.      [facility]

## 2024-08-25 NOTE — Anesthesia Preprocedure Evaluation (Signed)
 Anesthesia Evaluation  Patient identified by MRN, date of birth, ID band Patient awake    Reviewed: Allergy & Precautions, NPO status , Patient's Chart, lab work & pertinent test results  History of Anesthesia Complications Negative for: history of anesthetic complications  Airway Mallampati: III  TM Distance: >3 FB Neck ROM: Full    Dental  (+) Poor Dentition, Missing, Dental Advidsory Given,    Pulmonary neg shortness of breath, asthma , neg sleep apnea, COPD,  COPD inhaler, neg recent URI   Pulmonary exam normal breath sounds clear to auscultation       Cardiovascular Exercise Tolerance: Poor hypertension, Pt. on medications (-) angina (-) Past MI and (-) Cardiac Stents + dysrhythmias (incomplete RBBB) Atrial Fibrillation (-) Valvular Problems/Murmurs Rhythm:Irregular Rate:Normal - Systolic murmurs and - Peripheral Edema TTE performed on 09/15/2020 demonstrated normal left ventricular systolic function with an estimated EF of > 55%. There is no evidence of significant valvular regurgitation or transvalvular gradient suggestive of stenosis.    Neuro/Psych  PSYCHIATRIC DISORDERS Anxiety     negative neurological ROS     GI/Hepatic Neg liver ROS,GERD  Controlled,,  Endo/Other  negative endocrine ROS    Renal/GU CRFRenal disease  negative genitourinary   Musculoskeletal  (+) Arthritis , Osteoarthritis and Rheumatoid disorders,    Abdominal  (+) + obese  Peds negative pediatric ROS (+)  Hematology negative hematology ROS (+)   Anesthesia Other Findings Allergic rhinitis Anxiety Paroxysmal A-fib (HCC) Benign essential hypertension Chronic right shoulder pain GERD (gastroesophageal reflux disease) Pt held Apixaban  from 10/28  Hyperlipidemia, mixed Primary osteoarthritis of both knees Degenerative arthritis of right knee Weakness Lymphedema    Reproductive/Obstetrics negative OB ROS                               Anesthesia Physical Anesthesia Plan  ASA: 3  Anesthesia Plan: General   Post-op Pain Management:    Induction: Intravenous  PONV Risk Score and Plan: 2 and Treatment may vary due to age or medical condition, TIVA and Propofol  infusion  Airway Management Planned: Natural Airway and Simple Face Mask  Additional Equipment:   Intra-op Plan:   Post-operative Plan:   Informed Consent: I have reviewed the patients History and Physical, chart, labs and discussed the procedure including the risks, benefits and alternatives for the proposed anesthesia with the patient or authorized representative who has indicated his/her understanding and acceptance.     Dental advisory given  Plan Discussed with: CRNA and Anesthesiologist  Anesthesia Plan Comments:          Anesthesia Quick Evaluation

## 2024-08-25 NOTE — H&P (Signed)
 Outpatient short stay form Pre-procedure 08/25/2024  Haley ONEIDA Schick, MD  Primary Physician: Haley Cheryl BRAVO, MD  Reason for visit:  Positive cologuard  History of present illness:    73 y/o lady with history of rheumatoid arthritis, HLD, hypertension, COPD, obesity, and a. Fib with last dose of eliquis  4 days ago. No abdominal surgeries. No family history of GI malignancies. She has never had a colonoscopy before.    Current Facility-Administered Medications:    0.9 %  sodium chloride  infusion, , Intravenous, Continuous, Denita Lun, Haley ONEIDA, MD, Last Rate: 20 mL/hr at 08/25/24 1202, New Bag at 08/25/24 1202  Medications Prior to Admission  Medication Sig Dispense Refill Last Dose/Taking   acetaminophen  (TYLENOL ) 500 MG tablet Take 1-2 tablets (500-1,000 mg total) by mouth every 6 (six) hours as needed for mild pain (pain score 1-3 or temp > 100.5). 60 tablet 0    albuterol  (VENTOLIN  HFA) 108 (90 Base) MCG/ACT inhaler Inhale 2 puffs into the lungs every 6 (six) hours as needed for wheezing or shortness of breath.      amLODipine  (NORVASC ) 10 MG tablet Take 10 mg by mouth daily.      apixaban  (ELIQUIS ) 5 MG TABS tablet Take 5 mg by mouth 2 (two) times daily.   08/21/2024 at  6:00 PM   atorvastatin  (LIPITOR) 10 MG tablet Take 5 mg by mouth at bedtime.      budesonide-formoterol  (SYMBICORT) 80-4.5 MCG/ACT inhaler Inhale 2 puffs into the lungs 2 (two) times daily as needed (shortness of breath or wheezing).      cyclobenzaprine  (FLEXERIL ) 5 MG tablet Take 1 tablet (5 mg total) by mouth 3 (three) times daily as needed for muscle spasms. (Patient not taking: Reported on 10/24/2022) 30 tablet 0    docusate sodium  (COLACE) 100 MG capsule Take 1 capsule (100 mg total) by mouth 2 (two) times daily. (Patient not taking: Reported on 10/24/2022) 30 capsule 0    famotidine  (PEPCID ) 20 MG tablet Take 1 tablet (20 mg total) by mouth 2 (two) times daily for 10 days. 20 tablet 0    ferrous sulfate  325 (65  FE) MG tablet Take 325 mg by mouth daily with breakfast.      folic acid  (FOLVITE ) 1 MG tablet Take 1 mg by mouth daily.      hydroxychloroquine  (PLAQUENIL ) 200 MG tablet Take 200 mg by mouth 2 (two) times daily.      hydrOXYzine  (ATARAX ) 25 MG tablet Take 25 mg by mouth daily. At HS - 2 tablets      loratadine  (CLARITIN ) 10 MG tablet Take 10 mg by mouth daily.      mometasone  (ELOCON ) 0.1 % ointment Apply topically daily.      ondansetron  (ZOFRAN ) 4 MG tablet Take 1 tablet (4 mg total) by mouth every 6 (six) hours as needed for nausea. 30 tablet 0    oxyCODONE  (OXY IR/ROXICODONE ) 5 MG immediate release tablet Take 1-2 tablets (5-10 mg total) by mouth every 4 (four) hours as needed for severe pain. 40 tablet 0    PARoxetine  (PAXIL ) 30 MG tablet Take 30 mg by mouth daily.      potassium chloride  (KLOR-CON ) 10 MEQ tablet Take 10 mEq by mouth daily.      predniSONE  (STERAPRED UNI-PAK 21 TAB) 10 MG (21) TBPK tablet 6-day taper 21 tablet 0    triamcinolone  (KENALOG ) 0.025 % cream Apply 1 Application topically daily. (Patient not taking: Reported on 10/24/2022)      triamcinolone  cream (KENALOG )  0.1 % Apply 1 Application topically 2 (two) times daily. (Patient not taking: Reported on 10/24/2022)        Allergies  Allergen Reactions   Sulfa Antibiotics Hives and Itching   Leflunomide Itching   Amoxicillin-Pot Clavulanate Nausea Only   Valdecoxib Rash     Past Medical History:  Diagnosis Date   Anemia    Anesthesia complication    a.) (+) delayed emergence   Anxiety    Asthma    Atrial fibrillation (HCC)    a.) CHA2DS2VASc = 3 (age, sex, HTN);  b.) rate/rhythm maintained without pharmacological interventions; chronically anticoagulated with apixaban    Cellulitis    CKD (chronic kidney disease), stage IV (HCC)    COPD (chronic obstructive pulmonary disease) (HCC)    GERD (gastroesophageal reflux disease)    History of MRSA infection    HLD (hyperlipidemia)    Hypertension    Incomplete  right bundle branch block (RBBB)    Long term (current) use of anticoagulants    a.) Apixaban    Long term current use of immunosuppressive drug    a.) MTX + hydroxychloroquine  for RA   Lymphedema    Mild protein-calorie malnutrition (HCC)    Multinodular thyroid     Osteoarthritis    Rheumatoid arthritis (HCC)     Review of systems:  Otherwise negative.    Physical Exam  Gen: Alert, oriented. Appears stated age.  HEENT: PERRLA. Lungs: No respiratory distress CV: RRR Abd: soft, benign, no masses Ext: No edema    Planned procedures: Proceed with colonoscopy. The patient understands the nature of the planned procedure, indications, risks, alternatives and potential complications including but not limited to bleeding, infection, perforation, damage to internal organs and possible oversedation/side effects from anesthesia. The patient agrees and gives consent to proceed.  Please refer to procedure notes for findings, recommendations and patient disposition/instructions.     Haley ONEIDA Schick, MD [facility]

## 2024-08-25 NOTE — Interval H&P Note (Signed)
 History and Physical Interval Note:  08/25/2024 12:42 PM  [name]  has presented today for surgery, with the diagnosis of + Cologuard.  The various methods of treatment have been discussed with the patient and family. After consideration of risks, benefits and other options for treatment, the patient has consented to  Procedure(s) with comments: COLONOSCOPY (N/A) - Eliquis  as a surgical intervention.  The patient's history has been reviewed, patient examined, no change in status, stable for surgery.  I have reviewed the patient's chart and labs.  Questions were answered to the patient's satisfaction.     [name]  Ok to proceed with colonoscopy

## 2024-08-26 NOTE — Anesthesia Postprocedure Evaluation (Signed)
 Anesthesia Post Note  Patient: [name]  Procedure(s) Performed: COLONOSCOPY  Patient location during evaluation: Endoscopy Anesthesia Type: General Level of consciousness: awake and alert Pain management: pain level controlled Vital Signs Assessment: post-procedure vital signs reviewed and stable Respiratory status: spontaneous breathing, nonlabored ventilation, respiratory function stable and patient connected to nasal cannula oxygen Cardiovascular status: blood pressure returned to baseline and stable Postop Assessment: no apparent nausea or vomiting Anesthetic complications: no   No notable events documented.   Last Vitals:  Vitals:   08/25/24 1315 08/25/24 1320  BP: 120/65 127/83  Pulse: 67 67  Resp: 18 20  Temp:    SpO2: 98%     Last Pain:  Vitals:   08/26/24 0735  TempSrc:   PainSc: 0-No pain                 [name]

## 2024-11-12 ENCOUNTER — Inpatient Hospital Stay
Admission: EM | Admit: 2024-11-12 | Discharge: 2024-11-15 | DRG: 191 | Disposition: A | Discharge status: Home / Self Care | Attending: Internal Medicine | Admitting: Internal Medicine

## 2024-11-12 ENCOUNTER — Other Ambulatory Visit: Payer: Self-pay

## 2024-11-12 ENCOUNTER — Emergency Department

## 2024-11-12 DIAGNOSIS — Z888 Allergy status to other drugs, medicaments and biological substances status: Secondary | ICD-10-CM

## 2024-11-12 DIAGNOSIS — Z7901 Long term (current) use of anticoagulants: Secondary | ICD-10-CM

## 2024-11-12 DIAGNOSIS — J441 Chronic obstructive pulmonary disease with (acute) exacerbation: Principal | ICD-10-CM | POA: Diagnosis present

## 2024-11-12 DIAGNOSIS — Z6841 Body Mass Index (BMI) 40.0 and over, adult: Secondary | ICD-10-CM

## 2024-11-12 DIAGNOSIS — I4821 Permanent atrial fibrillation: Secondary | ICD-10-CM | POA: Diagnosis present

## 2024-11-12 DIAGNOSIS — Z96653 Presence of artificial knee joint, bilateral: Secondary | ICD-10-CM | POA: Diagnosis present

## 2024-11-12 DIAGNOSIS — K219 Gastro-esophageal reflux disease without esophagitis: Secondary | ICD-10-CM | POA: Diagnosis present

## 2024-11-12 DIAGNOSIS — Z79899 Other long term (current) drug therapy: Secondary | ICD-10-CM

## 2024-11-12 DIAGNOSIS — A419 Sepsis, unspecified organism: Principal | ICD-10-CM

## 2024-11-12 DIAGNOSIS — E785 Hyperlipidemia, unspecified: Secondary | ICD-10-CM | POA: Diagnosis present

## 2024-11-12 DIAGNOSIS — J101 Influenza due to other identified influenza virus with other respiratory manifestations: Secondary | ICD-10-CM

## 2024-11-12 DIAGNOSIS — I10 Essential (primary) hypertension: Secondary | ICD-10-CM | POA: Diagnosis present

## 2024-11-12 DIAGNOSIS — Z9049 Acquired absence of other specified parts of digestive tract: Secondary | ICD-10-CM

## 2024-11-12 DIAGNOSIS — I129 Hypertensive chronic kidney disease with stage 1 through stage 4 chronic kidney disease, or unspecified chronic kidney disease: Secondary | ICD-10-CM | POA: Diagnosis present

## 2024-11-12 DIAGNOSIS — E66813 Obesity, class 3: Secondary | ICD-10-CM | POA: Diagnosis present

## 2024-11-12 DIAGNOSIS — Z882 Allergy status to sulfonamides status: Secondary | ICD-10-CM

## 2024-11-12 DIAGNOSIS — M069 Rheumatoid arthritis, unspecified: Secondary | ICD-10-CM | POA: Diagnosis present

## 2024-11-12 DIAGNOSIS — Z7951 Long term (current) use of inhaled steroids: Secondary | ICD-10-CM

## 2024-11-12 DIAGNOSIS — Z880 Allergy status to penicillin: Secondary | ICD-10-CM

## 2024-11-12 DIAGNOSIS — Z8249 Family history of ischemic heart disease and other diseases of the circulatory system: Secondary | ICD-10-CM

## 2024-11-12 DIAGNOSIS — I4891 Unspecified atrial fibrillation: Secondary | ICD-10-CM | POA: Diagnosis present

## 2024-11-12 DIAGNOSIS — Z833 Family history of diabetes mellitus: Secondary | ICD-10-CM

## 2024-11-12 DIAGNOSIS — R7881 Bacteremia: Secondary | ICD-10-CM

## 2024-11-12 DIAGNOSIS — Z881 Allergy status to other antibiotic agents status: Secondary | ICD-10-CM

## 2024-11-12 DIAGNOSIS — Z8614 Personal history of Methicillin resistant Staphylococcus aureus infection: Secondary | ICD-10-CM

## 2024-11-12 DIAGNOSIS — N183 Chronic kidney disease, stage 3 unspecified: Secondary | ICD-10-CM | POA: Diagnosis present

## 2024-11-12 LAB — BLOOD GAS, VENOUS
Acid-Base Excess: 6.1 mmol/L — ABNORMAL HIGH (ref 0.0–2.0)
Bicarbonate: 31.2 mmol/L — ABNORMAL HIGH (ref 20.0–28.0)
O2 Saturation: 77.8 %
Patient temperature: 37
pCO2, Ven: 46 mmHg (ref 44–60)
pH, Ven: 7.44 — ABNORMAL HIGH (ref 7.25–7.43)
pO2, Ven: 45 mmHg (ref 32–45)

## 2024-11-12 LAB — COMPREHENSIVE METABOLIC PANEL WITH GFR
ALT: 19 U/L (ref 0–44)
AST: 33 U/L (ref 15–41)
Albumin: 4 g/dL (ref 3.5–5.0)
Alkaline Phosphatase: 110 U/L (ref 38–126)
Anion gap: 11 (ref 5–15)
BUN: 12 mg/dL (ref 8–23)
CO2: 26 mmol/L (ref 22–32)
Calcium: 8.6 mg/dL — ABNORMAL LOW (ref 8.9–10.3)
Chloride: 104 mmol/L (ref 98–111)
Creatinine, Ser: 1.05 mg/dL — ABNORMAL HIGH (ref 0.44–1.00)
GFR, Estimated: 56 mL/min — ABNORMAL LOW (ref >60)
Glucose, Bld: 103 mg/dL — ABNORMAL HIGH (ref 70–99)
Potassium: 3.7 mmol/L (ref 3.5–5.1)
Sodium: 140 mmol/L (ref 135–145)
Total Bilirubin: 0.6 mg/dL (ref 0.0–1.2)
Total Protein: 6.5 g/dL (ref 6.5–8.1)

## 2024-11-12 LAB — CBC WITH DIFFERENTIAL/PLATELET
Abs Immature Granulocytes: 0.01 K/uL (ref 0.00–0.07)
Basophils Absolute: 0 K/uL (ref 0.0–0.1)
Basophils Relative: 1 %
Eosinophils Absolute: 0.1 K/uL (ref 0.0–0.5)
Eosinophils Relative: 2 %
HCT: 38 % (ref 36.0–46.0)
Hemoglobin: 12.2 g/dL (ref 12.0–15.0)
Immature Granulocytes: 0 %
Lymphocytes Relative: 20 %
Lymphs Abs: 0.8 K/uL (ref 0.7–4.0)
MCH: 31 pg (ref 26.0–34.0)
MCHC: 32.1 g/dL (ref 30.0–36.0)
MCV: 96.4 fL (ref 80.0–100.0)
Monocytes Absolute: 1 K/uL (ref 0.1–1.0)
Monocytes Relative: 26 %
Neutro Abs: 2 K/uL (ref 1.7–7.7)
Neutrophils Relative %: 51 %
Platelets: 124 K/uL — ABNORMAL LOW (ref 150–400)
RBC: 3.94 MIL/uL (ref 3.87–5.11)
RDW: 18.2 % — ABNORMAL HIGH (ref 11.5–15.5)
WBC: 4 K/uL (ref 4.0–10.5)
nRBC: 0 % (ref 0.0–0.2)

## 2024-11-12 LAB — PRO BRAIN NATRIURETIC PEPTIDE: Pro Brain Natriuretic Peptide: 1855 pg/mL — ABNORMAL HIGH (ref <300.0)

## 2024-11-12 LAB — RESP PANEL BY RT-PCR (RSV, FLU A&B, COVID)  RVPGX2
Influenza A by PCR: POSITIVE — AB
Influenza B by PCR: NEGATIVE
Resp Syncytial Virus by PCR: NEGATIVE
SARS Coronavirus 2 by RT PCR: NEGATIVE

## 2024-11-12 LAB — URINALYSIS, W/ REFLEX TO CULTURE (INFECTION SUSPECTED)
Bilirubin Urine: NEGATIVE
Glucose, UA: NEGATIVE mg/dL
Hgb urine dipstick: NEGATIVE
Ketones, ur: NEGATIVE mg/dL
Nitrite: NEGATIVE
Protein, ur: NEGATIVE mg/dL
Specific Gravity, Urine: 1.01 (ref 1.005–1.030)
pH: 5 (ref 5.0–8.0)

## 2024-11-12 LAB — TROPONIN T, HIGH SENSITIVITY
Troponin T High Sensitivity: 19 ng/L (ref 0–19)
Troponin T High Sensitivity: 17 ng/L (ref 0–19)

## 2024-11-12 LAB — LACTIC ACID, PLASMA: Lactic Acid, Venous: 1.8 mmol/L (ref 0.5–1.9)

## 2024-11-12 LAB — PROCALCITONIN: Procalcitonin: <0.1 ng/mL

## 2024-11-12 LAB — PROTIME-INR
INR: 1.2 (ref 0.8–1.2)
Prothrombin Time: 16.3 s — ABNORMAL HIGH (ref 11.4–15.2)

## 2024-11-12 MED ORDER — ACETAMINOPHEN 325 MG PO TABS: 650.0000 mg | ORAL_TABLET | Freq: Four times a day (QID) | ORAL | Status: DC | PRN

## 2024-11-12 MED ORDER — OSELTAMIVIR PHOSPHATE 75 MG PO CAPS
75.0000 mg | ORAL_CAPSULE | Freq: Two times a day (BID) | ORAL | Status: DC
  Administered 2024-11-12 – 2024-11-15 (×7): 75 mg via ORAL
  Filled 2024-11-12 (×10): qty 1

## 2024-11-12 MED ORDER — LACTATED RINGERS IV BOLUS (SEPSIS)
1000.0000 mL | Freq: Once | INTRAVENOUS | Status: AC
Start: 2024-11-12 — End: 2024-11-12
  Administered 2024-11-12: 1000 mL via INTRAVENOUS

## 2024-11-12 MED ORDER — ONDANSETRON HCL 4 MG PO TABS
4.0000 mg | ORAL_TABLET | Freq: Four times a day (QID) | ORAL | Status: DC | PRN
Start: 2024-11-12 — End: 2024-11-15

## 2024-11-12 MED ORDER — METHYLPREDNISOLONE SODIUM SUCC 40 MG IJ SOLR
40.0000 mg | Freq: Every day | INTRAMUSCULAR | Status: DC
  Administered 2024-11-13: 40 mg via INTRAVENOUS
  Filled 2024-11-12: qty 1

## 2024-11-12 MED ORDER — LACTATED RINGERS IV SOLN: INTRAVENOUS | Status: DC

## 2024-11-12 MED ORDER — ACETAMINOPHEN 500 MG PO TABS
1000.0000 mg | ORAL_TABLET | Freq: Once | ORAL | Status: AC
  Administered 2024-11-12: 1000 mg via ORAL
  Filled 2024-11-12: qty 2

## 2024-11-12 MED ORDER — METRONIDAZOLE 500 MG/100ML IV SOLN
500.0000 mg | Freq: Once | INTRAVENOUS | Status: AC
  Administered 2024-11-12: 500 mg via INTRAVENOUS
  Filled 2024-11-12: qty 100

## 2024-11-12 MED ORDER — ONDANSETRON HCL 4 MG/2ML IJ SOLN: 4.0000 mg | Freq: Four times a day (QID) | INTRAMUSCULAR | Status: DC | PRN

## 2024-11-12 MED ORDER — AMLODIPINE BESYLATE 5 MG PO TABS
5.0000 mg | ORAL_TABLET | Freq: Every day | ORAL | Status: DC
  Administered 2024-11-12 – 2024-11-15 (×4): 5 mg via ORAL
  Filled 2024-11-12 (×4): qty 1

## 2024-11-12 MED ORDER — VANCOMYCIN HCL IN DEXTROSE 1-5 GM/200ML-% IV SOLN: 1000.0000 mg | Freq: Once | INTRAVENOUS | Status: DC

## 2024-11-12 MED ORDER — METHYLPREDNISOLONE SODIUM SUCC 125 MG IJ SOLR
125.0000 mg | Freq: Once | INTRAMUSCULAR | Status: AC
  Administered 2024-11-12: 125 mg via INTRAVENOUS
  Filled 2024-11-12: qty 2

## 2024-11-12 MED ORDER — IPRATROPIUM-ALBUTEROL 0.5-2.5 (3) MG/3ML IN SOLN
3.0000 mL | RESPIRATORY_TRACT | Status: AC
  Administered 2024-11-12 (×2): 3 mL via RESPIRATORY_TRACT
  Filled 2024-11-12: qty 6

## 2024-11-12 MED ORDER — APIXABAN 5 MG PO TABS
5.0000 mg | ORAL_TABLET | Freq: Two times a day (BID) | ORAL | Status: DC
  Administered 2024-11-12 – 2024-11-15 (×6): 5 mg via ORAL
  Filled 2024-11-12 (×6): qty 1

## 2024-11-12 MED ORDER — IPRATROPIUM-ALBUTEROL 0.5-2.5 (3) MG/3ML IN SOLN
3.0000 mL | Freq: Four times a day (QID) | RESPIRATORY_TRACT | Status: DC
  Administered 2024-11-12 – 2024-11-14 (×8): 3 mL via RESPIRATORY_TRACT
  Filled 2024-11-12 (×8): qty 3

## 2024-11-12 MED ORDER — SODIUM CHLORIDE 0.9 % IV SOLN
2.0000 g | Freq: Once | INTRAVENOUS | Status: AC
  Administered 2024-11-12: 2 g via INTRAVENOUS
  Filled 2024-11-12: qty 10

## 2024-11-12 MED ORDER — ATORVASTATIN CALCIUM 10 MG PO TABS
5.0000 mg | ORAL_TABLET | Freq: Every day | ORAL | Status: DC
  Administered 2024-11-13 – 2024-11-14 (×2): 5 mg via ORAL
  Filled 2024-11-12 (×3): qty 0.5

## 2024-11-12 MED ORDER — SODIUM CHLORIDE 0.9 % IV SOLN
500.0000 mg | INTRAVENOUS | Status: AC
  Administered 2024-11-12 – 2024-11-13 (×2): 500 mg via INTRAVENOUS
  Filled 2024-11-12 (×2): qty 5

## 2024-11-12 MED ORDER — VANCOMYCIN HCL 2000 MG/400ML IV SOLN
2000.0000 mg | Freq: Once | INTRAVENOUS | Status: AC
  Administered 2024-11-12: 2000 mg via INTRAVENOUS
  Filled 2024-11-12: qty 400

## 2024-11-12 MED ORDER — ACETAMINOPHEN 650 MG RE SUPP: 650.0000 mg | Freq: Four times a day (QID) | RECTAL | Status: DC | PRN

## 2024-11-12 MED ORDER — ALBUTEROL SULFATE (2.5 MG/3ML) 0.083% IN NEBU: 2.5000 mg | INHALATION_SOLUTION | RESPIRATORY_TRACT | Status: DC | PRN

## 2024-11-12 MED ORDER — TRAZODONE HCL 50 MG PO TABS
25.0000 mg | ORAL_TABLET | Freq: Every evening | ORAL | Status: DC | PRN
  Administered 2024-11-13 (×2): 25 mg via ORAL
  Filled 2024-11-12 (×2): qty 1

## 2024-11-12 NOTE — Sepsis Progress Note (Signed)
 Elink monitoring for the code sepsis protocol.

## 2024-11-12 NOTE — ED Provider Notes (Signed)
 [facility] Provider Note    Event Date/Time   First MD Initiated Contact with Patient 11/12/24 [phone]     (approximate)   History   Shortness of Breath   HPI  [name] is a 73 y.o. female with history of atrial fibrillation on Eliquis , COPD, chronic kidney disease, hypertension, hyperlipidemia, rheumatoid arthritis who presents to the emergency department with cough, shortness of breath that started yesterday.  She denies any chest pain.  Does have a fever here but was not aware of fever at home.  Has productive cough.  States she has been around family who have been diagnosed with bronchitis.  Denies history of CHF, PE or DVT.  No calf tenderness or calf swelling.  EMS gave 1 DuoNeb treatment and 1 albuterol  treatment with improvement in symptoms.  He was not hypoxic with EMS.   History provided by patient, EMS.    Past Medical History:  Diagnosis Date   Anemia    Anesthesia complication    a.) (+) delayed emergence   Anxiety    Asthma    Atrial fibrillation (HCC)    a.) CHA2DS2VASc = 3 (age, sex, HTN);  b.) rate/rhythm maintained without pharmacological interventions; chronically anticoagulated with apixaban    Cellulitis    CKD (chronic kidney disease), stage IV (HCC)    COPD (chronic obstructive pulmonary disease) (HCC)    GERD (gastroesophageal reflux disease)    History of MRSA infection    HLD (hyperlipidemia)    Hypertension    Incomplete right bundle branch block (RBBB)    Long term (current) use of anticoagulants    a.) Apixaban    Long term current use of immunosuppressive drug    a.) MTX + hydroxychloroquine  for RA   Lymphedema    Mild protein-calorie malnutrition    Multinodular thyroid     Osteoarthritis    Rheumatoid arthritis (HCC)     Past Surgical History:  Procedure Laterality Date   CESAREAN SECTION     CHOLECYSTECTOMY N/A 07/31/2006   COLONOSCOPY N/A 08/25/2024   Procedure: COLONOSCOPY;  Surgeon: Maryruth Ole DASEN, MD;  Location: [facility] ENDOSCOPY;  Service: Endoscopy;  Laterality: N/A;  Eliquis    FOOT SURGERY Left    MOUTH SURGERY     TOTAL KNEE ARTHROPLASTY Left 10/18/2021   Procedure: TOTAL KNEE ARTHROPLASTY;  Surgeon: Edie Norleen PARAS, MD;  Location: [facility] ORS;  Service: Orthopedics;  Laterality: Left;   TOTAL KNEE ARTHROPLASTY Right 10/24/2022   Procedure: TOTAL KNEE ARTHROPLASTY - RNFA;  Surgeon: Edie Norleen PARAS, MD;  Location: [facility] ORS;  Service: Orthopedics;  Laterality: Right;   WISDOM TOOTH EXTRACTION      MEDICATIONS:  Prior to Admission medications   Medication Sig Start Date End Date Taking? Authorizing Provider  acetaminophen  (TYLENOL ) 500 MG tablet Take 1-2 tablets (500-1,000 mg total) by mouth every 6 (six) hours as needed for mild pain (pain score 1-3 or temp > 100.5). 10/20/21   Kip Lynwood Double, PA-C  albuterol  (VENTOLIN  HFA) 108 (90 Base) MCG/ACT inhaler Inhale 2 puffs into the lungs every 6 (six) hours as needed for wheezing or shortness of breath. 12/31/18   [provider]  amLODipine  (NORVASC ) 10 MG tablet Take 10 mg by mouth daily. 03/15/20   [provider]  apixaban  (ELIQUIS ) 5 MG TABS tablet Take 5 mg by mouth 2 (two) times daily. 09/17/20   [provider]  atorvastatin  (LIPITOR) 10 MG tablet Take 5 mg by mouth at bedtime. 09/18/20   [provider]  budesonide-formoterol  (SYMBICORT) 80-4.5 MCG/ACT inhaler Inhale 2 puffs into the lungs 2 (two) times daily as needed (shortness of breath or wheezing). 05/18/20   [provider]  cyclobenzaprine  (FLEXERIL ) 5 MG tablet Take 1 tablet (5 mg total) by mouth 3 (three) times daily as needed for muscle spasms. Patient not taking: Reported on 10/24/2022 10/20/21   Kip Lynwood Double, PA-C  docusate sodium  (COLACE) 100 MG capsule Take 1 capsule (100 mg total) by mouth 2 (two) times daily. Patient not taking: Reported on 10/24/2022 10/20/21   Kip Lynwood Double, PA-C  famotidine  (PEPCID ) 20 MG tablet Take 1  tablet (20 mg total) by mouth 2 (two) times daily for 10 days. 05/10/24 05/20/24  Menshew, Candida LULLA Kings, PA-C  ferrous sulfate  325 (65 FE) MG tablet Take 325 mg by mouth daily with breakfast. 07/07/21   [provider]  folic acid  (FOLVITE ) 1 MG tablet Take 1 mg by mouth daily.    [provider]  hydroxychloroquine  (PLAQUENIL ) 200 MG tablet Take 200 mg by mouth 2 (two) times daily. 08/09/21   [provider]  hydrOXYzine  (ATARAX ) 25 MG tablet Take 25 mg by mouth daily. At HS - 2 tablets    [provider]  loratadine  (CLARITIN ) 10 MG tablet Take 10 mg by mouth daily.    [provider]  mometasone  (ELOCON ) 0.1 % ointment Apply topically daily.    [provider]  ondansetron  (ZOFRAN ) 4 MG tablet Take 1 tablet (4 mg total) by mouth every 6 (six) hours as needed for nausea. 10/25/22   Kip Lynwood Double, PA-C  oxyCODONE  (OXY IR/ROXICODONE ) 5 MG immediate release tablet Take 1-2 tablets (5-10 mg total) by mouth every 4 (four) hours as needed for severe pain. 10/25/22   Kip Lynwood Double, PA-C  PARoxetine  (PAXIL ) 30 MG tablet Take 30 mg by mouth daily. 07/29/20   [provider]  potassium chloride  (KLOR-CON ) 10 MEQ tablet Take 10 mEq by mouth daily. 07/30/20   [provider]  predniSONE  (STERAPRED UNI-PAK 21 TAB) 10 MG (21) TBPK tablet 6-day taper 05/10/24   Menshew, Candida LULLA Kings, PA-C  triamcinolone  (KENALOG ) 0.025 % cream Apply 1 Application topically daily. Patient not taking: Reported on 10/24/2022    [provider]  triamcinolone  cream (KENALOG ) 0.1 % Apply 1 Application topically 2 (two) times daily. Patient not taking: Reported on 10/24/2022    [provider]    Physical Exam   Triage Vital Signs: ED Triage Vitals  Encounter Vitals Group     BP 11/12/24 0537 (!) 135/93     Girls Systolic BP Percentile --      Girls Diastolic BP Percentile --      Boys Systolic BP Percentile --      Boys Diastolic  BP Percentile --      Pulse Rate 11/12/24 0540 97     Resp 11/12/24 0540 (!) 25     Temp 11/12/24 0543 (!) 101.6 F (38.7 C)     Temp Source 11/12/24 0537 Oral     SpO2 11/12/24 0537 100 %     Weight 11/12/24 0536 299 lb (135.6 kg)     Height --      Head Circumference --      Peak Flow --      Pain Score 11/12/24 0538 5     Pain Loc --      Pain Education --      Exclude from Growth Chart --  Most recent vital signs: Vitals:   11/12/24 1900 11/12/24 2039  BP: 114/61 (!) 149/73  Pulse: 83 70  Resp: (!) 21 20  Temp: 97.6 F (36.4 C) 98.1 F (36.7 C)  SpO2: 95% 96%    CONSTITUTIONAL: Alert, responds appropriately to questions.  Elderly, chronically ill-appearing HEAD: Normocephalic, atraumatic EYES: Conjunctivae clear, pupils appear equal, sclera nonicteric ENT: normal nose; moist mucous membranes NECK: Supple, normal ROM CARD: Irregularly irregular and tachycardic; S1 and S2 appreciated RESP: Patient is tachypneic.  No hypoxia.  Speaking full sentences.  Mild increased work of breathing.  No distress.  Inspiratory and expiratory wheezes heard diffusely.  No rhonchi or rales. ABD/GI: Non-distended; soft, non-tender, no rebound, no guarding, no peritoneal signs BACK: The back appears normal EXT: Normal ROM in all joints; no deformity noted, no edema, no calf tenderness or calf swelling SKIN: Normal color for age and race; warm; no rash on exposed skin NEURO: Moves all extremities equally, normal speech PSYCH: The patient's mood and manner are appropriate.   ED Results / Procedures / Treatments   LABS: (all labs ordered are listed, but only abnormal results are displayed) Labs Reviewed  RESP PANEL BY RT-PCR (RSV, FLU A&B, COVID)  RVPGX2 - Abnormal; Notable for the following components:      Result Value   Influenza A by PCR POSITIVE (*)    All other components within normal limits  COMPREHENSIVE METABOLIC PANEL WITH GFR - Abnormal; Notable for the following  components:   Glucose, Bld 103 (*)    Creatinine, Ser 1.05 (*)    Calcium  8.6 (*)    GFR, Estimated 56 (*)    All other components within normal limits  CBC WITH DIFFERENTIAL/PLATELET - Abnormal; Notable for the following components:   RDW 18.2 (*)    Platelets 124 (*)    All other components within normal limits  PROTIME-INR - Abnormal; Notable for the following components:   Prothrombin Time 16.3 (*)    All other components within normal limits  PRO BRAIN NATRIURETIC PEPTIDE - Abnormal; Notable for the following components:   Pro Brain Natriuretic Peptide 1,855.0 (*)    All other components within normal limits  BLOOD GAS, VENOUS - Abnormal; Notable for the following components:   pH, Ven 7.44 (*)    Bicarbonate 31.2 (*)    Acid-Base Excess 6.1 (*)    All other components within normal limits  URINALYSIS, W/ REFLEX TO CULTURE (INFECTION SUSPECTED) - Abnormal; Notable for the following components:   Color, Urine YELLOW (*)    APPearance CLEAR (*)    Leukocytes,Ua TRACE (*)    Bacteria, UA RARE (*)    All other components within normal limits  CULTURE, BLOOD (ROUTINE X 2)  CULTURE, BLOOD (ROUTINE X 2)  LACTIC ACID, PLASMA  PROCALCITONIN  BASIC METABOLIC PANEL WITH GFR  CBC  TROPONIN T, HIGH SENSITIVITY  TROPONIN T, HIGH SENSITIVITY     EKG:  EKG Interpretation Date/Time:  Wednesday November 12 2024 05:45:19 EST Ventricular Rate:  91 PR Interval:    QRS Duration:  123 QT Interval:  490 QTC Calculation: 603 R Axis:   62  Text Interpretation: Atrial fibrillation IVCD, consider atypical RBBB Nonspecific T abnormalities, lateral leads Confirmed by UNCONFIRMED, DOCTOR ([id number]), editor Alex Slough [phone]) on 11/12/2024 10:27:09 AM         RADIOLOGY: My personal review and interpretation of imaging: Chest x-ray shows no pneumonia.  I have personally reviewed all radiology reports.   DG Chest [facility]  1 View Result Date: 11/12/2024 EXAM: 1 VIEW(S) XRAY OF THE CHEST  11/12/2024 05:55:00 AM COMPARISON: 01/28/2024 CLINICAL HISTORY: Questionable sepsis - evaluate for abnormality FINDINGS: LUNGS AND PLEURA: No focal pulmonary opacity. No pleural effusion. No pneumothorax. HEART AND MEDIASTINUM: Stable mild cardiomegaly. Aortic arch calcifications. BONES AND SOFT TISSUES: Thoracic spondylosis. IMPRESSION: 1. No acute findings. 2. Stable mild cardiomegaly and aortic arch calcifications. Electronically signed by: Evalene Coho MD 11/12/2024 06:06 AM EST RP Workstation: [id number]     PROCEDURES:  Critical Care performed: Yes, see critical care procedure note(s)   CRITICAL CARE Performed by: Josette Sink   Total critical care time: 30 minutes  Critical care time was exclusive of separately billable procedures and treating other patients.  Critical care was necessary to treat or prevent imminent or life-threatening deterioration.  Critical care was time spent personally by me on the following activities: development of treatment plan with patient and/or surrogate as well as nursing, discussions with consultants, evaluation of patient's response to treatment, examination of patient, obtaining history from patient or surrogate, ordering and performing treatments and interventions, ordering and review of laboratory studies, ordering and review of radiographic studies, pulse oximetry and re-evaluation of patient's condition.   SABRA1-3 Lead EKG Interpretation  Performed by: Taelyn Nemes Boissonneault, Josette SAILOR, DO Authorized by: , Josette SAILOR, DO     Interpretation: abnormal     ECG rate:  97   ECG rate assessment: normal     Rhythm: atrial fibrillation     Ectopy: none     Conduction: normal       IMPRESSION / MDM / ASSESSMENT AND PLAN / ED COURSE  I reviewed the triage vital signs and the nursing notes.    Patient here with shortness of breath, wheezing found to be febrile with productive cough.  The patient is on the cardiac monitor to evaluate for evidence of  arrhythmia and/or significant heart rate changes.   DIFFERENTIAL DIAGNOSIS (includes but not limited to):   Sepsis, pneumonia, viral URI, PE, COPD exacerbation, pneumothorax, CHF exacerbation, ACS   Patient's presentation is most consistent with acute presentation with potential threat to life or bodily function.   PLAN: Patient is having a COPD exacerbation.  Will continue breathing treatments and give IV Solu-Medrol .  Sats in the low 90s with EMS.  She has mild increased work of breathing but no distress.  She also meets sepsis criteria as she is tachycardic, tachypneic and febrile here.  Suspect respiratory source.  Will obtain labs, cultures, urine, chest x-ray, COVID and flu swab.  Will give broad-spectrum antibiotics and IV fluids.   MEDICATIONS GIVEN IN ED: Medications  azithromycin  (ZITHROMAX ) 500 mg in sodium chloride  0.9 % 250 mL IVPB (0 mg Intravenous Stopped 11/12/24 1127)  amLODipine  (NORVASC ) tablet 5 mg (5 mg Oral Given 11/12/24 1136)  atorvastatin  (LIPITOR) tablet 5 mg (5 mg Oral Patient Refused/Not Given 11/12/24 2325)  apixaban  (ELIQUIS ) tablet 5 mg (5 mg Oral Not Given 11/12/24 2324)  acetaminophen  (TYLENOL ) tablet 650 mg (has no administration in time range)    Or  acetaminophen  (TYLENOL ) suppository 650 mg (has no administration in time range)  traZODone  (DESYREL ) tablet 25 mg (has no administration in time range)  ondansetron  (ZOFRAN ) tablet 4 mg (has no administration in time range)    Or  ondansetron  (ZOFRAN ) injection 4 mg (has no administration in time range)  albuterol  (PROVENTIL ) (2.5 MG/3ML) 0.083% nebulizer solution 2.5 mg (has no administration in time range)  ipratropium-albuterol  (DUONEB) 0.5-2.5 (3) MG/3ML nebulizer solution  3 mL (3 mLs Nebulization Given 11/12/24 2110)  methylPREDNISolone  sodium succinate (SOLU-MEDROL ) 40 mg/mL injection 40 mg (has no administration in time range)  oseltamivir  (TAMIFLU ) capsule 75 mg (75 mg Oral Given 11/12/24 1136)   lactated ringers  bolus 1,000 mL (0 mLs Intravenous Stopped 11/12/24 0957)  aztreonam  (AZACTAM ) 2 g in sodium chloride  0.9 % 100 mL IVPB (0 g Intravenous Stopped 11/12/24 0729)  metroNIDAZOLE  (FLAGYL ) IVPB 500 mg (0 mg Intravenous Stopped 11/12/24 0729)  vancomycin  (VANCOREADY) IVPB 2000 mg/400 mL (0 mg Intravenous Stopped 11/12/24 0957)  acetaminophen  (TYLENOL ) tablet 1,000 mg (1,000 mg Oral Given 11/12/24 0610)  ipratropium-albuterol  (DUONEB) 0.5-2.5 (3) MG/3ML nebulizer solution 3 mL (3 mLs Nebulization Given 11/12/24 0651)  methylPREDNISolone  sodium succinate (SOLU-MEDROL ) 125 mg/2 mL injection 125 mg (125 mg Intravenous Given 11/12/24 0642)     ED COURSE: Patient's labs show no leukocytosis or leukopenia.  Normal electrolytes.  Troponin negative.  BNP is elevated at 1800.  No calf tenderness, edema or JVD on exam.  She denies any known history of CHF.  She is not on diuretics.  It does look like she had an echocardiogram at [facility] in July 2024 which showed EF greater than 55% with noted normal systolic function.  Will hold additional IV fluids given lactic is normal.  I do not feel she needs diuresis at this time but will continue to monitor.  Chest x-ray reviewed and interpreted by myself the radiologist and shows no infiltrate or edema.   CONSULTS:  Consulted and discussed patient's case with hospitalist.  I have recommended admission and consulting physician agrees and will place admission orders.  Patient (and family if present) agree with this plan.   I reviewed all nursing notes, vitals, pertinent previous records.  All labs, EKGs, imaging ordered have been independently reviewed and interpreted by myself.    OUTSIDE RECORDS REVIEWED: Reviewed recent GI notes in September 2025.       FINAL CLINICAL IMPRESSION(S) / ED DIAGNOSES   Final diagnoses:  Acute sepsis (HCC)  COPD exacerbation (HCC)     Rx / DC Orders   ED Discharge Orders     None        Note:  This  document was prepared using Dragon voice recognition software and may include unintentional dictation errors.   Cassaundra Rasch, Josette SAILOR, DO 11/12/24 2353

## 2024-11-12 NOTE — ED Notes (Signed)
 Patient cleaned up after an episode of incontinence. Patient's linens were changed and patient was placed on a clean paper pad. Patient repositioned and bed returned to lowest position. Patient denies other needs at this time.

## 2024-11-12 NOTE — ED Notes (Signed)
Blood cultures obtained before antibiotics started

## 2024-11-12 NOTE — H&P (Signed)
 History and Physical  [name] FMW:[id number] DOB: September 08, 1951 DOA: 11/12/2024  PCP: [name], MD   Chief Complaint: Shortness of breath  HPI: [name] is a 73 y.o. female with medical history significant for atrial fibrillation, CKD stage IV, COPD on room air, rheumatoid arthritis, hypertension being admitted to the hospital with cough, shortness of breath likely due to acute exacerbation of COPD.  Patient states she was actually in her usual state of health until about 3 AM yesterday morning, when she developed shortness of breath, which was worse with exertion.  She also developed a productive cough which is not normal for her.  At baseline she is on room air, she was around some folks who had bronchitis.  She denies any chest pain, fevers, nausea, vomiting, dysuria, or abdominal pain.  She received fluid bolus, DuoNeb, and IV steroids in the emergency department as well as empiric IV antibiotics.  She now tells me that she feels much better.  Review of Systems: Please see HPI for pertinent positives and negatives. A complete 10 system review of systems are otherwise negative.  Past Medical History:  Diagnosis Date   Anemia    Anesthesia complication    a.) (+) delayed emergence   Anxiety    Asthma    Atrial fibrillation (HCC)    a.) CHA2DS2VASc = 3 (age, sex, HTN);  b.) rate/rhythm maintained without pharmacological interventions; chronically anticoagulated with apixaban    Cellulitis    CKD (chronic kidney disease), stage IV (HCC)    COPD (chronic obstructive pulmonary disease) (HCC)    GERD (gastroesophageal reflux disease)    History of MRSA infection    HLD (hyperlipidemia)    Hypertension    Incomplete right bundle branch block (RBBB)    Long term (current) use of anticoagulants    a.) Apixaban    Long term current use of immunosuppressive drug    a.) MTX + hydroxychloroquine  for RA   Lymphedema    Mild protein-calorie malnutrition    Multinodular  thyroid     Osteoarthritis    Rheumatoid arthritis (HCC)    Past Surgical History:  Procedure Laterality Date   CESAREAN SECTION     CHOLECYSTECTOMY N/A 07/31/2006   COLONOSCOPY N/A 08/25/2024   Procedure: COLONOSCOPY;  Surgeon: Maryruth Ole DASEN, MD;  Location: [facility] ENDOSCOPY;  Service: Endoscopy;  Laterality: N/A;  Eliquis    FOOT SURGERY Left    MOUTH SURGERY     TOTAL KNEE ARTHROPLASTY Left 10/18/2021   Procedure: TOTAL KNEE ARTHROPLASTY;  Surgeon: Edie Norleen PARAS, MD;  Location: [facility] ORS;  Service: Orthopedics;  Laterality: Left;   TOTAL KNEE ARTHROPLASTY Right 10/24/2022   Procedure: TOTAL KNEE ARTHROPLASTY - RNFA;  Surgeon: Edie Norleen PARAS, MD;  Location: [facility] ORS;  Service: Orthopedics;  Laterality: Right;   WISDOM TOOTH EXTRACTION     Social History:  reports that she has never smoked. She has never used smokeless tobacco. She reports that she does not drink alcohol and does not use drugs.  Allergies  Allergen Reactions   Sulfa Antibiotics Hives and Itching   Leflunomide Itching   Amoxicillin-Pot Clavulanate Nausea Only   Valdecoxib Rash    Family History  Problem Relation Age of Onset   Diabetes Mother    Hypertension Mother    Hypertension Father      Prior to Admission medications   Medication Sig Start Date End Date Taking? Authorizing Provider  acetaminophen  (TYLENOL ) 500 MG tablet Take 1-2 tablets (500-1,000 mg total) by mouth  every 6 (six) hours as needed for mild pain (pain score 1-3 or temp > 100.5). 10/20/21   Kip Lynwood Double, PA-C  albuterol  (VENTOLIN  HFA) 108 (90 Base) MCG/ACT inhaler Inhale 2 puffs into the lungs every 6 (six) hours as needed for wheezing or shortness of breath. 12/31/18   [provider]  amLODipine  (NORVASC ) 10 MG tablet Take 10 mg by mouth daily. 03/15/20   [provider]  apixaban  (ELIQUIS ) 5 MG TABS tablet Take 5 mg by mouth 2 (two) times daily. 09/17/20   [provider]  atorvastatin  (LIPITOR) 10 MG tablet Take  5 mg by mouth at bedtime. 09/18/20   [provider]  budesonide-formoterol  (SYMBICORT) 80-4.5 MCG/ACT inhaler Inhale 2 puffs into the lungs 2 (two) times daily as needed (shortness of breath or wheezing). 05/18/20   [provider]  cyclobenzaprine  (FLEXERIL ) 5 MG tablet Take 1 tablet (5 mg total) by mouth 3 (three) times daily as needed for muscle spasms. Patient not taking: Reported on 10/24/2022 10/20/21   Kip Lynwood Double, PA-C  docusate sodium  (COLACE) 100 MG capsule Take 1 capsule (100 mg total) by mouth 2 (two) times daily. Patient not taking: Reported on 10/24/2022 10/20/21   Kip Lynwood Double, PA-C  famotidine  (PEPCID ) 20 MG tablet Take 1 tablet (20 mg total) by mouth 2 (two) times daily for 10 days. 05/10/24 05/20/24  Menshew, Candida LULLA Kings, PA-C  ferrous sulfate  325 (65 FE) MG tablet Take 325 mg by mouth daily with breakfast. 07/07/21   [provider]  folic acid  (FOLVITE ) 1 MG tablet Take 1 mg by mouth daily.    [provider]  hydroxychloroquine  (PLAQUENIL ) 200 MG tablet Take 200 mg by mouth 2 (two) times daily. 08/09/21   [provider]  hydrOXYzine  (ATARAX ) 25 MG tablet Take 25 mg by mouth daily. At HS - 2 tablets    [provider]  loratadine  (CLARITIN ) 10 MG tablet Take 10 mg by mouth daily.    [provider]  mometasone  (ELOCON ) 0.1 % ointment Apply topically daily.    [provider]  ondansetron  (ZOFRAN ) 4 MG tablet Take 1 tablet (4 mg total) by mouth every 6 (six) hours as needed for nausea. 10/25/22   Kip Lynwood Double, PA-C  oxyCODONE  (OXY IR/ROXICODONE ) 5 MG immediate release tablet Take 1-2 tablets (5-10 mg total) by mouth every 4 (four) hours as needed for severe pain. 10/25/22   Kip Lynwood Double, PA-C  PARoxetine  (PAXIL ) 30 MG tablet Take 30 mg by mouth daily. 07/29/20   [provider]  potassium chloride  (KLOR-CON ) 10 MEQ tablet Take 10 mEq by mouth daily. 07/30/20   [provider]   predniSONE  (STERAPRED UNI-PAK 21 TAB) 10 MG (21) TBPK tablet 6-day taper 05/10/24   Menshew, Candida LULLA Kings, PA-C  triamcinolone  (KENALOG ) 0.025 % cream Apply 1 Application topically daily. Patient not taking: Reported on 10/24/2022    [provider]  triamcinolone  cream (KENALOG ) 0.1 % Apply 1 Application topically 2 (two) times daily. Patient not taking: Reported on 10/24/2022    [provider]    Physical Exam: BP (!) 135/93   Pulse 97   Temp 98.3 F (36.8 C)   Resp (!) 25   Wt 135.6 kg   SpO2 100%   BMI 48.26 kg/m  General:  Alert, oriented, calm, in no acute distress, resting comfortably on room air.  Able to speak in full sentences, intermittent nonproductive cough. Cardiovascular: Irregularly irregular rhythm, no murmurs or  rubs, no peripheral edema  Respiratory: Reduced air movement bilaterally, with diffuse rhonchi, no active tachypnea, wheezing, stridor, or other evidence of acute respiratory distress Abdomen: soft, nontender, nondistended, normal bowel tones heard  Skin: dry, no rashes  Musculoskeletal: no joint effusions, normal range of motion  Psychiatric: appropriate affect, normal speech  Neurologic: extraocular muscles intact, clear speech, moving all extremities with intact sensorium         Labs on Admission:  Basic Metabolic Panel: Recent Labs  Lab 11/12/24 0543  NA 140  K 3.7  CL 104  CO2 26  GLUCOSE 103*  BUN 12  CREATININE 1.05*  CALCIUM  8.6*   Liver Function Tests: Recent Labs  Lab 11/12/24 0543  AST 33  ALT 19  ALKPHOS 110  BILITOT 0.6  PROT 6.5  ALBUMIN 4.0   No results for input(s): LIPASE, AMYLASE in the last 168 hours. No results for input(s): AMMONIA in the last 168 hours. CBC: Recent Labs  Lab 11/12/24 0543  WBC 4.0  NEUTROABS 2.0  HGB 12.2  HCT 38.0  MCV 96.4  PLT 124*   Cardiac Enzymes: No results for input(s): CKTOTAL, CKMB, CKMBINDEX, TROPONINI in the last 168 hours. BNP (last 3  results) No results for input(s): BNP in the last 8760 hours.  ProBNP (last 3 results) Recent Labs    11/12/24 0543  PROBNP 1,855.0*    CBG: No results for input(s): GLUCAP in the last 168 hours.  Radiological Exams on Admission: DG Chest Port 1 View Result Date: 11/12/2024 EXAM: 1 VIEW(S) XRAY OF THE CHEST 11/12/2024 05:55:00 AM COMPARISON: 01/28/2024 CLINICAL HISTORY: Questionable sepsis - evaluate for abnormality FINDINGS: LUNGS AND PLEURA: No focal pulmonary opacity. No pleural effusion. No pneumothorax. HEART AND MEDIASTINUM: Stable mild cardiomegaly. Aortic arch calcifications. BONES AND SOFT TISSUES: Thoracic spondylosis. IMPRESSION: 1. No acute findings. 2. Stable mild cardiomegaly and aortic arch calcifications. Electronically signed by: Evalene Coho MD 11/12/2024 06:06 AM EST RP Workstation: [id number]   Assessment/Plan [name] is a 73 y.o. female with medical history significant for atrial fibrillation, CKD stage IV, COPD on room air, rheumatoid arthritis, hypertension being admitted to the hospital with cough, shortness of breath likely due to acute exacerbation of COPD.  Acute exacerbation of COPD-suspected due to increased cough, dyspnea with exertion, and wheezing which has improved with steroids and breathing treatments.  She also developed a fever here in the emergency department, however there is no consolidation on chest x-ray. -Observation admission -Empiric IV azithromycin  -Scheduled DuoNebs, as needed albuterol  neb -Incentive spirometry and flutter valve -IV Solu-Medrol   Permanent A-fib-continue Eliquis   Hyperlipidemia-Lipitor  RA-Plaquenil   Hypertension-Norvasc     Code Status: Full Code  Consults called: None  Admission status: Observation  Time spent: 49 minutes  Haley Long CHRISTELLA Gail MD [facility] Pager [phone]  If 7PM-7AM, please contact night-coverage [email or web address] Password [facility]  11/12/2024, 9:21 AM

## 2024-11-12 NOTE — ED Provider Notes (Signed)
 Clinical Course as of 11/12/24 1049  Wed Nov 12, 2024  9246 Received sign out: COPD, sepsis, needs scheduled nebs.  [HD]  9241 Accepted by hospitalist for admission [HD]    Clinical Course User Index [HD] Nicholaus Rolland BRAVO, MD      Nicholaus Rolland BRAVO, MD 11/12/24 1049

## 2024-11-12 NOTE — Progress Notes (Signed)
 CODE SEPSIS - PHARMACY COMMUNICATION  **Broad Spectrum Antibiotics should be administered within 1 hour of Sepsis diagnosis**  Time Code Sepsis Called/Page Received: 0544  Antibiotics Ordered: Aztreonam , Flagyl , Vancomycin   Time of 1st antibiotic administration: 0607  [place] CANDIE Dills, PharmD, [facility] 11/12/2024 5:45 AM

## 2024-11-12 NOTE — ED Triage Notes (Signed)
 Pt to ED [facility] for shob started at 0300 yesterday. Received albuterol , duoneb PTA. +cough. +h/a +sick contacts

## 2024-11-12 NOTE — ED Notes (Signed)
 Pt assisted to toilet. Changed into gown.

## 2024-11-13 DIAGNOSIS — J101 Influenza due to other identified influenza virus with other respiratory manifestations: Secondary | ICD-10-CM

## 2024-11-13 DIAGNOSIS — I4821 Permanent atrial fibrillation: Secondary | ICD-10-CM

## 2024-11-13 DIAGNOSIS — R7881 Bacteremia: Secondary | ICD-10-CM

## 2024-11-13 DIAGNOSIS — J441 Chronic obstructive pulmonary disease with (acute) exacerbation: Secondary | ICD-10-CM | POA: Diagnosis not present

## 2024-11-13 DIAGNOSIS — E785 Hyperlipidemia, unspecified: Secondary | ICD-10-CM

## 2024-11-13 DIAGNOSIS — N1831 Chronic kidney disease, stage 3a: Secondary | ICD-10-CM

## 2024-11-13 DIAGNOSIS — I10 Essential (primary) hypertension: Secondary | ICD-10-CM

## 2024-11-13 DIAGNOSIS — M069 Rheumatoid arthritis, unspecified: Secondary | ICD-10-CM

## 2024-11-13 DIAGNOSIS — E66813 Obesity, class 3: Secondary | ICD-10-CM

## 2024-11-13 DIAGNOSIS — N183 Chronic kidney disease, stage 3 unspecified: Secondary | ICD-10-CM

## 2024-11-13 LAB — BASIC METABOLIC PANEL WITH GFR
Anion gap: 10 (ref 5–15)
BUN: 20 mg/dL (ref 8–23)
CO2: 24 mmol/L (ref 22–32)
Calcium: 8.1 mg/dL — ABNORMAL LOW (ref 8.9–10.3)
Chloride: 105 mmol/L (ref 98–111)
Creatinine, Ser: 1.18 mg/dL — ABNORMAL HIGH (ref 0.44–1.00)
GFR, Estimated: 49 mL/min — ABNORMAL LOW (ref >60)
Glucose, Bld: 128 mg/dL — ABNORMAL HIGH (ref 70–99)
Potassium: 3.9 mmol/L (ref 3.5–5.1)
Sodium: 138 mmol/L (ref 135–145)

## 2024-11-13 LAB — BLOOD CULTURE ID PANEL (REFLEXED) - BCID2

## 2024-11-13 LAB — URINALYSIS, W/ REFLEX TO CULTURE (INFECTION SUSPECTED)
Bilirubin Urine: NEGATIVE
Glucose, UA: NEGATIVE mg/dL
Hgb urine dipstick: NEGATIVE
Ketones, ur: NEGATIVE mg/dL
Leukocytes,Ua: NEGATIVE
Nitrite: NEGATIVE
Protein, ur: NEGATIVE mg/dL
Specific Gravity, Urine: 1.004 — ABNORMAL LOW (ref 1.005–1.030)
Squamous Epithelial / HPF: 0 /HPF (ref 0–5)
WBC, UA: 0 WBC/hpf (ref 0–5)
pH: 5 (ref 5.0–8.0)

## 2024-11-13 LAB — CBC
HCT: 35.9 % — ABNORMAL LOW (ref 36.0–46.0)
Hemoglobin: 11.4 g/dL — ABNORMAL LOW (ref 12.0–15.0)
MCH: 30.7 pg (ref 26.0–34.0)
MCHC: 31.8 g/dL (ref 30.0–36.0)
MCV: 96.8 fL (ref 80.0–100.0)
Platelets: 130 K/uL — ABNORMAL LOW (ref 150–400)
RBC: 3.71 MIL/uL — ABNORMAL LOW (ref 3.87–5.11)
RDW: 17.8 % — ABNORMAL HIGH (ref 11.5–15.5)
WBC: 4.7 K/uL (ref 4.0–10.5)
nRBC: 0 % (ref 0.0–0.2)

## 2024-11-13 LAB — MRSA NEXT GEN BY PCR, NASAL: MRSA by PCR Next Gen: NOT DETECTED

## 2024-11-13 MED ORDER — FUROSEMIDE 10 MG/ML IJ SOLN: 40.0000 mg | Freq: Once | INTRAMUSCULAR | Status: DC

## 2024-11-13 MED ORDER — SODIUM CHLORIDE 0.9 % IV SOLN
2.0000 g | Freq: Three times a day (TID) | INTRAVENOUS | Status: DC
  Administered 2024-11-13 – 2024-11-14 (×3): 2 g via INTRAVENOUS
  Filled 2024-11-13 (×5): qty 12.5

## 2024-11-13 MED ORDER — AZITHROMYCIN 500 MG PO TABS
500.0000 mg | ORAL_TABLET | Freq: Every day | ORAL | Status: DC
  Administered 2024-11-14 – 2024-11-15 (×2): 500 mg via ORAL
  Filled 2024-11-13 (×2): qty 1

## 2024-11-13 MED ORDER — LORATADINE 10 MG PO TABS
10.0000 mg | ORAL_TABLET | Freq: Every day | ORAL | Status: DC
  Administered 2024-11-14 – 2024-11-15 (×2): 10 mg via ORAL
  Filled 2024-11-13 (×2): qty 1

## 2024-11-13 MED ORDER — HYDROXYCHLOROQUINE SULFATE 200 MG PO TABS
200.0000 mg | ORAL_TABLET | Freq: Two times a day (BID) | ORAL | Status: DC
  Administered 2024-11-13 – 2024-11-15 (×5): 200 mg via ORAL
  Filled 2024-11-13 (×5): qty 1

## 2024-11-13 MED ORDER — FOLIC ACID 1 MG PO TABS
1.0000 mg | ORAL_TABLET | Freq: Every day | ORAL | Status: DC
  Administered 2024-11-13 – 2024-11-15 (×3): 1 mg via ORAL
  Filled 2024-11-13 (×3): qty 1

## 2024-11-13 MED ORDER — FUROSEMIDE 10 MG/ML IJ SOLN
20.0000 mg | Freq: Once | INTRAMUSCULAR | Status: AC
  Administered 2024-11-13: 20 mg via INTRAVENOUS
  Filled 2024-11-13: qty 2

## 2024-11-13 MED ORDER — HYDROXYZINE HCL 50 MG PO TABS
50.0000 mg | ORAL_TABLET | Freq: Every day | ORAL | Status: DC
  Administered 2024-11-13 – 2024-11-14 (×2): 50 mg via ORAL
  Filled 2024-11-13 (×2): qty 1

## 2024-11-13 MED ORDER — PREDNISONE 50 MG PO TABS
50.0000 mg | ORAL_TABLET | Freq: Every day | ORAL | Status: DC
  Administered 2024-11-14 – 2024-11-15 (×2): 50 mg via ORAL
  Filled 2024-11-13 (×2): qty 1

## 2024-11-13 MED ORDER — FERROUS SULFATE 325 (65 FE) MG PO TABS
325.0000 mg | ORAL_TABLET | Freq: Every day | ORAL | Status: DC
  Administered 2024-11-14 – 2024-11-15 (×2): 325 mg via ORAL
  Filled 2024-11-13 (×2): qty 1

## 2024-11-13 MED ORDER — GATIFLOXACIN 0.5 % OP SOLN
1.0000 [drp] | Freq: Four times a day (QID) | OPHTHALMIC | Status: DC
  Administered 2024-11-13 – 2024-11-15 (×6): 1 [drp] via OPHTHALMIC
  Filled 2024-11-13: qty 2.5

## 2024-11-13 MED ORDER — PAROXETINE HCL 30 MG PO TABS
30.0000 mg | ORAL_TABLET | Freq: Every day | ORAL | Status: DC
  Administered 2024-11-13 – 2024-11-15 (×3): 30 mg via ORAL
  Filled 2024-11-13 (×3): qty 1

## 2024-11-13 NOTE — Assessment & Plan Note (Signed)
 Patient was on weekly methotrexate  and Plaquenil  at home. - Continue Plaquenil  for now

## 2024-11-13 NOTE — Assessment & Plan Note (Signed)
 1/4 blood cultures with gram-negative rods with no identification.  UA does not look infected. - Starting on cefepime  -Follow-up blood culture results

## 2024-11-13 NOTE — Assessment & Plan Note (Signed)
-   Continue home statin

## 2024-11-13 NOTE — Hospital Course (Addendum)
 Partly taken from H&P.  [name] is a 73 y.o. female with medical history significant for atrial fibrillation, CKD stage IV, COPD on room air, rheumatoid arthritis, hypertension being admitted to the hospital with cough, shortness of breath likely due to acute exacerbation of COPD.   Respiratory panel positive for influenza A.  Procalcitonin negative, chest x-ray with no acute abnormalities.  Patient was started on Solu-Medrol  and Tamiflu .  11/27: Vitals and labs stable.1/4 blood culture bottles with gram-negative rods with no identification.  Starting on cefepime  while waiting for further results.  UA was not consistent with UTI. PT and OT ordered.  11/28: Hemodynamically stable, lab corrected that it was 1/4 gram-positive cocci which is most likely a contaminant, discontinuing cefepime  and repeating blood cultures.  PT and OT are recommending home health.  11/29: Remained hemodynamically stable, prior 1/4 blood cultures with bacillus species, most likely contaminant.  Repeat blood cultures are negative. No need to continue antibiotics.  Patient is being discharge with 2 more days of prednisone  and Tamiflu  to complete a total of 5-day course.  A nebulizer machine was earlier ordered to be delivered at home.  She was also given some DuoNeb to use as needed.  He was given a new prescription of Symbicort.  She will continue on current medications and follow-up with her providers for further assistance.

## 2024-11-13 NOTE — Assessment & Plan Note (Signed)
 Likely secondary to influenza A, chest x-ray was negative for any acute abnormality and procalcitonin negative. - Continue with Tamiflu  to complete a 5-day course -Continue with Zithromax  -Continue with steroid-switching Solu-Medrol  with prednisone  -Continue with bronchodilators -Continue with supportive care -

## 2024-11-13 NOTE — Progress Notes (Signed)
 PHARMACIST - PHYSICIAN COMMUNICATION DR:   Caleen CONCERNING: Antibiotic IV to Oral Route Change Policy  RECOMMENDATION: This patient is receiving azithromycin  by the intravenous route.  Based on criteria approved by the Pharmacy and Therapeutics Committee, the antibiotic(s) is/are being converted to the equivalent oral dose form(s).   DESCRIPTION: These criteria include: Patient being treated for a respiratory tract infection, urinary tract infection, cellulitis or clostridium difficile associated diarrhea if on metronidazole  The patient is not neutropenic and does not exhibit a GI malabsorption state The patient is eating (either orally or via tube) and/or has been taking other orally administered medications for a least 24 hours The patient is improving clinically and has a Tmax < 100.5  If you have questions about this conversion, please contact the Pharmacy Department    Kayla Blew, PharmD, BCPS

## 2024-11-13 NOTE — Assessment & Plan Note (Signed)
 Slight increase in creatinine but not meeting the criteria for AKI.  Mostly within baseline. -Monitor renal function -Avoid nephrotoxins

## 2024-11-13 NOTE — Progress Notes (Signed)
  Progress Note   Patient: [name] FMW:[id number] DOB: Feb 18, 1951 DOA: 11/12/2024     0 DOS: the patient was seen and examined on 11/13/2024   Brief hospital course: Partly taken from H&P.  [name] is a 73 y.o. female with medical history significant for atrial fibrillation, CKD stage IV, COPD on room air, rheumatoid arthritis, hypertension being admitted to the hospital with cough, shortness of breath likely due to acute exacerbation of COPD.   Respiratory panel positive for influenza A.  Procalcitonin negative, chest x-ray with no acute abnormalities.  Patient was started on Solu-Medrol  and Tamiflu .  11/27: Vitals and labs stable.1/4 blood culture bottles with gram-negative rods with no identification.  Starting on cefepime  while waiting for further results.  UA was not consistent with UTI. PT and OT ordered.  Assessment and Plan: * COPD with acute exacerbation (HCC) Likely secondary to influenza A, chest x-ray was negative for any acute abnormality and procalcitonin negative. - Continue with Tamiflu  to complete a 5-day course -Continue with Zithromax  -Continue with steroid-switching Solu-Medrol  with prednisone  -Continue with bronchodilators -Continue with supportive care -  Positive blood culture 1/4 blood cultures with gram-negative rods with no identification.  UA does not look infected. - Starting on cefepime  -Follow-up blood culture results  Atrial fibrillation (HCC) Rate controlled, not on any rate control medications. - Continue with Eliquis   Chronic kidney disease (CKD), stage III (moderate) (HCC) Slight increase in creatinine but not meeting the criteria for AKI.  Mostly within baseline. -Monitor renal function -Avoid nephrotoxins  Hypertension - Continue home amlodipine   Rheumatoid arthritis (HCC) Patient was on weekly methotrexate  and Plaquenil  at home. - Continue Plaquenil  for now  HLD (hyperlipidemia) - Continue home statin  Obesity,  Class III, BMI 40-49.9 (morbid obesity) (HCC) Estimated body mass index is 48.26 kg/m as calculated from the following:   Height as of 08/25/24: 5' 6 (1.676 m).   Weight as of this encounter: 135.6 kg.   - This will complicate overall prognosis  Subjective: Patient was sitting in chair when seen today, no SOB. On room air.  Physical Exam: Vitals:   11/12/24 2111 11/13/24 0415 11/13/24 0743 11/13/24 0745  BP:  (!) 126/58 126/63   Pulse:  62 63   Resp:  16 20   Temp:   (!) 97.5 F (36.4 C)   TempSrc:      SpO2: 98% 96% 96% 96%  Weight:       General.  Morbidly obese lady, in no acute distress. Pulmonary.  Mild scattered wheeze bilaterally, normal respiratory effort. CV.  Regular rate and rhythm, no JVD, rub or murmur. Abdomen.  Soft, nontender, nondistended, BS positive. CNS.  Alert and oriented .  No focal neurologic deficit. Extremities.  No edema, pulses intact and symmetrical. Psychiatry.  Judgment and insight appears normal.   Data Reviewed: Prior data reviewed  Family Communication: Discussed with daughter on phone  Disposition: Status is: Inpatient Remains inpatient appropriate because: Severity of illness  Planned Discharge Destination: Home with Home Health  DVT prophylaxis.  Eliquis  Time spent: 50 minutes  This record has been created using Conservation officer, historic buildings. Errors have been sought and corrected,but may not always be located. Such creation errors do not reflect on the standard of care.   Author: Amaryllis Dare, MD 11/13/2024 2:21 PM  For on call review [email or web address].

## 2024-11-13 NOTE — Assessment & Plan Note (Signed)
 Rate controlled, not on any rate control medications. - Continue with Eliquis 

## 2024-11-13 NOTE — Progress Notes (Signed)
 PHARMACY - PHYSICIAN COMMUNICATION CRITICAL VALUE ALERT - BLOOD CULTURE IDENTIFICATION (BCID)  [name] is an 73 y.o. female who presented to [facility] on 11/12/2024 with a chief complaint of shortness of breath  Assessment:  1 of 4(anaerobic bottle only) GNR, no speciation available currently  Name of physician (or Provider) ContactedBETHA Caleen Qualia  Current antibiotics: Azithromycin   Changes to prescribed antibiotics recommended:  Recommendations accepted by provider Will add Cefepime  and await further speciation/sensitivities to de-escalate.  Results for orders placed or performed during the hospital encounter of 11/12/24  Blood Culture ID Panel (Reflexed) (Collected: 11/12/2024  5:42 AM)  Result Value Ref Range   Enterococcus faecalis NOT DETECTED NOT DETECTED   Enterococcus Faecium NOT DETECTED NOT DETECTED   Listeria monocytogenes NOT DETECTED NOT DETECTED   Staphylococcus species NOT DETECTED NOT DETECTED   Staphylococcus aureus (BCID) NOT DETECTED NOT DETECTED   Staphylococcus epidermidis NOT DETECTED NOT DETECTED   Staphylococcus lugdunensis NOT DETECTED NOT DETECTED   Streptococcus species NOT DETECTED NOT DETECTED   Streptococcus agalactiae NOT DETECTED NOT DETECTED   Streptococcus pneumoniae NOT DETECTED NOT DETECTED   Streptococcus pyogenes NOT DETECTED NOT DETECTED   A.calcoaceticus-baumannii NOT DETECTED NOT DETECTED   Bacteroides fragilis NOT DETECTED NOT DETECTED   Enterobacterales NOT DETECTED NOT DETECTED   Enterobacter cloacae complex NOT DETECTED NOT DETECTED   Escherichia coli NOT DETECTED NOT DETECTED   Klebsiella aerogenes NOT DETECTED NOT DETECTED   Klebsiella oxytoca NOT DETECTED NOT DETECTED   Klebsiella pneumoniae NOT DETECTED NOT DETECTED   Proteus species NOT DETECTED NOT DETECTED   Salmonella species NOT DETECTED NOT DETECTED   Serratia marcescens NOT DETECTED NOT DETECTED   Haemophilus influenzae NOT DETECTED NOT DETECTED   Neisseria  meningitidis NOT DETECTED NOT DETECTED   Pseudomonas aeruginosa NOT DETECTED NOT DETECTED   Stenotrophomonas maltophilia NOT DETECTED NOT DETECTED   Candida albicans NOT DETECTED NOT DETECTED   Candida auris NOT DETECTED NOT DETECTED   Candida glabrata NOT DETECTED NOT DETECTED   Candida krusei NOT DETECTED NOT DETECTED   Candida parapsilosis NOT DETECTED NOT DETECTED   Candida tropicalis NOT DETECTED NOT DETECTED   Cryptococcus neoformans/gattii NOT DETECTED NOT DETECTED    [name] [name] 11/13/2024  11:58 AM

## 2024-11-13 NOTE — Assessment & Plan Note (Signed)
 Estimated body mass index is 48.26 kg/m as calculated from the following:   Height as of 08/25/24: 5' 6 (1.676 m).   Weight as of this encounter: 135.6 kg.   - This will complicate overall prognosis

## 2024-11-13 NOTE — Assessment & Plan Note (Signed)
 Continue home amlodipine

## 2024-11-13 NOTE — Plan of Care (Signed)

## 2024-11-13 NOTE — TOC Progression Note (Signed)
 Transition of Care [facility]) - Progression Note    Patient Details  Name: [name] MRN: [id number] Date of Birth: [birth year]  Transition of Care [facility]) CM/SW Contact  Delontae Lamm L Deontray [place], [place] Phone Number: 11/13/2024, 10:39 AM  Clinical Narrative:     Secure chat received. DME order entered for nebulizer by attending. CSW contacted [facility] to place order for home delivery.                     Expected Discharge Plan and Services                                               Social Drivers of Health (SDOH) Interventions SDOH Screenings   Food Insecurity: Patient Declined (11/12/2024)  Housing: Patient Declined (11/12/2024)  Transportation Needs: No Transportation Needs (11/12/2024)  Utilities: Patient Declined (11/12/2024)  Financial Resource Strain: Medium Risk (08/11/2024)   Received from [facility]  Social Connections: Patient Declined (11/12/2024)  Tobacco Use: Low Risk  (11/12/2024)    Readmission Risk Interventions     No data to display

## 2024-11-14 DIAGNOSIS — J101 Influenza due to other identified influenza virus with other respiratory manifestations: Secondary | ICD-10-CM | POA: Diagnosis not present

## 2024-11-14 DIAGNOSIS — I4821 Permanent atrial fibrillation: Secondary | ICD-10-CM | POA: Diagnosis not present

## 2024-11-14 DIAGNOSIS — R7881 Bacteremia: Secondary | ICD-10-CM | POA: Diagnosis not present

## 2024-11-14 DIAGNOSIS — J441 Chronic obstructive pulmonary disease with (acute) exacerbation: Secondary | ICD-10-CM | POA: Diagnosis not present

## 2024-11-14 MED ORDER — METRONIDAZOLE 500 MG/100ML IV SOLN
500.0000 mg | Freq: Two times a day (BID) | INTRAVENOUS | Status: DC
  Filled 2024-11-14: qty 100

## 2024-11-14 MED ORDER — IPRATROPIUM-ALBUTEROL 0.5-2.5 (3) MG/3ML IN SOLN
3.0000 mL | Freq: Three times a day (TID) | RESPIRATORY_TRACT | Status: DC
  Administered 2024-11-14 – 2024-11-15 (×3): 3 mL via RESPIRATORY_TRACT
  Filled 2024-11-14 (×3): qty 3

## 2024-11-14 NOTE — Assessment & Plan Note (Signed)
 Slight increase in creatinine but not meeting the criteria for AKI.  Mostly within baseline. -Monitor renal function -Avoid nephrotoxins

## 2024-11-14 NOTE — Progress Notes (Signed)
 PHARMACY - PHYSICIAN COMMUNICATION CRITICAL VALUE ALERT - BLOOD CULTURE IDENTIFICATION (BCID)  [name] is an 73 y.o. female who presented to [facility] on 11/12/2024 with a chief complaint of shortness of breath  Assessment:  1 of 4(anaerobic bottle only) GNR, no speciation available currently  Name of physician (or Provider) ContactedBETHA Caleen Qualia  Current antibiotics: Azithromycin   Changes to prescribed antibiotics recommended:  Recommendations accepted by provider Will add Cefepime  and await further speciation/sensitivities to de-escalate.  ADDENDUM 11/14/2024 12:14 PM Microbiology lab called to correct the gram stain that was read at [facility] lab.  It is a Gram positive rod (not a gram negative rod).  Patient without evidence of infection (other than influenza) so this could represent contaminant.   Discussed with Dr Caleen and will stop Cefepime  and metronidazole  that was started due to initial blood culture gram stain result  Results for orders placed or performed during the hospital encounter of 11/12/24  Blood Culture ID Panel (Reflexed) (Collected: 11/12/2024  5:42 AM)  Result Value Ref Range   Enterococcus faecalis NOT DETECTED NOT DETECTED   Enterococcus Faecium NOT DETECTED NOT DETECTED   Listeria monocytogenes NOT DETECTED NOT DETECTED   Staphylococcus species NOT DETECTED NOT DETECTED   Staphylococcus aureus (BCID) NOT DETECTED NOT DETECTED   Staphylococcus epidermidis NOT DETECTED NOT DETECTED   Staphylococcus lugdunensis NOT DETECTED NOT DETECTED   Streptococcus species NOT DETECTED NOT DETECTED   Streptococcus agalactiae NOT DETECTED NOT DETECTED   Streptococcus pneumoniae NOT DETECTED NOT DETECTED   Streptococcus pyogenes NOT DETECTED NOT DETECTED   A.calcoaceticus-baumannii NOT DETECTED NOT DETECTED   Bacteroides fragilis NOT DETECTED NOT DETECTED   Enterobacterales NOT DETECTED NOT DETECTED   Enterobacter cloacae complex NOT DETECTED NOT DETECTED    Escherichia coli NOT DETECTED NOT DETECTED   Klebsiella aerogenes NOT DETECTED NOT DETECTED   Klebsiella oxytoca NOT DETECTED NOT DETECTED   Klebsiella pneumoniae NOT DETECTED NOT DETECTED   Proteus species NOT DETECTED NOT DETECTED   Salmonella species NOT DETECTED NOT DETECTED   Serratia marcescens NOT DETECTED NOT DETECTED   Haemophilus influenzae NOT DETECTED NOT DETECTED   Neisseria meningitidis NOT DETECTED NOT DETECTED   Pseudomonas aeruginosa NOT DETECTED NOT DETECTED   Stenotrophomonas maltophilia NOT DETECTED NOT DETECTED   Candida albicans NOT DETECTED NOT DETECTED   Candida auris NOT DETECTED NOT DETECTED   Candida glabrata NOT DETECTED NOT DETECTED   Candida krusei NOT DETECTED NOT DETECTED   Candida parapsilosis NOT DETECTED NOT DETECTED   Candida tropicalis NOT DETECTED NOT DETECTED   Cryptococcus neoformans/gattii NOT DETECTED NOT DETECTED   Celestine Slovak, PharmD, BCPS, BCIDP Work Cell: [phone] 11/14/2024 12:14 PM

## 2024-11-14 NOTE — Evaluation (Signed)
 Occupational Therapy Evaluation Patient Details Name: [name] MRN: [id number] DOB: 24-Jul-1951 Today's Date: 11/14/2024   History of Present Illness   73 y.o. female with medical history significant for atrial fibrillation, CKD stage IV, COPD on room air, rheumatoid arthritis, hypertension being admitted to the hospital with cough, shortness of breath likely due to acute exacerbation of COPD.     Clinical Impressions Pt was seen for OT evaluation this date. PTA, she resides alone in a one level senior living apartment where she reports being MOD I/IND with all tasks and utilizing a rollator for mobility. Pt presents with deficits in activity tolerance, affecting safe and optimal ADL completion. Pt currently requires CGA for STS from recliner with arms with increased time and effort noted. Ambulated within the room using RW with SBA to perform standing grooming tasks in the bathroom with supervision provided. Edu on implementation of ECS, therapeutic rest breaks, use of AE/AD and task modification to prevent overexertion. Pt verbalized understanding. Sp02 and HR were stable throughout session on RA.  Will follow acutely to maximize return to PLOF, but do not anticipate the need for follow up OT services upon acute hospital DC.      If plan is discharge home, recommend the following:   A little help with walking and/or transfers;A little help with bathing/dressing/bathroom     Functional Status Assessment   Patient has had a recent decline in their functional status and demonstrates the ability to make significant improvements in function in a reasonable and predictable amount of time.     Equipment Recommendations   None recommended by OT     Recommendations for Other Services         Precautions/Restrictions   Precautions Precautions: Fall Restrictions Weight Bearing Restrictions Per Provider Order: No     Mobility Bed Mobility               General  bed mobility comments: NT in recliner pre/post session    Transfers Overall transfer level: Needs assistance Equipment used: Rolling walker (2 wheels) Transfers: Sit to/from Stand Sit to Stand: Contact guard assist           General transfer comment: increased time to stand from recliner with rocking motion needed however no physical assist required; amb within the room using RW with SBA      Balance Overall balance assessment: Modified Independent                                         ADL either performed or assessed with clinical judgement   ADL Overall ADL's : Needs assistance/impaired     Grooming: Wash/dry hands;Wash/dry face;Oral care;Brushing hair;Standing;Set up;Supervision/safety   Upper Body Bathing: Supervision/ safety;Standing Upper Body Bathing Details (indicate cue type and reason): bathe under arms         Lower Body Dressing: Minimal assistance;Sit to/from stand Lower Body Dressing Details (indicate cue type and reason): to donn mesh underwear Min A needed d/t tightness over hips             Functional mobility during ADLs: Supervision/safety;Contact guard assist;Rolling walker (2 wheels)       Vision Patient Visual Report: No change from baseline       Perception         Praxis         Pertinent Vitals/Pain Pain Assessment Pain Assessment: No/denies pain  Extremity/Trunk Assessment Upper Extremity Assessment Upper Extremity Assessment: Overall WFL for tasks assessed   Lower Extremity Assessment Lower Extremity Assessment: Overall WFL for tasks assessed;Generalized weakness       Communication Communication Communication: No apparent difficulties   Cognition Arousal: Alert Behavior During Therapy: WFL for tasks assessed/performed                                 Following commands: Intact       Cueing  General Comments   Cueing Techniques: Verbal cues  HR and sp02 stable  throughout session at rest and with activity on RA, pt noted with mild SOB   Exercises Other Exercises Other Exercises: Edu on role of OT in acute setting, ECS, therapeutic rest breaks and task modificated to prevent overexertion.   Shoulder Instructions      Home Living Family/patient expects to be discharged to:: Private residence Living Arrangements: Alone Available Help at Discharge: Available PRN/intermittently Type of Home: Apartment Home Access: Level entry     Home Layout: One level     Bathroom Shower/Tub: Producer, Television/film/video: Handicapped height     Home Equipment: Rollator (4 wheels);Cane - single point;Grab bars - tub/shower;Shower seat - built in   Additional Comments: lives in senior living apartments that are handicapped accessible      Prior Functioning/Environment Prior Level of Function : Independent/Modified Independent             Mobility Comments: Mod indep with ADLs, household and limited community mobilization with [phone] but does drive.  Denies fall history; no home O2. ADLs Comments: reports independence with all home and self management    OT Problem List: Decreased activity tolerance   OT Treatment/Interventions: Self-care/ADL training;Therapeutic exercise;Therapeutic activities;DME and/or AE instruction;Patient/family education;Energy conservation      OT Goals(Current goals can be found in the care plan section)   Acute Rehab OT Goals Patient Stated Goal: get better OT Goal Formulation: With patient Time For Goal Achievement: 11/28/24 Potential to Achieve Goals: Good ADL Goals Additional ADL Goal #1: Pt will demo implementation of 1 learned ECS during ADL performance 2/2 trials to maximize safety/IND and prevent overexertion.   OT Frequency:  Min 1X/week    Co-evaluation              AM-PAC OT 6 Clicks Daily Activity     Outcome Measure Help from another person eating meals?: None Help from another person  taking care of personal grooming?: None Help from another person toileting, which includes using toliet, bedpan, or urinal?: A Little Help from another person bathing (including washing, rinsing, drying)?: A Little Help from another person to put on and taking off regular upper body clothing?: None Help from another person to put on and taking off regular lower body clothing?: A Little 6 Click Score: 21   End of Session Equipment Utilized During Treatment: Rolling walker (2 wheels) Nurse Communication: Mobility status  Activity Tolerance: Patient tolerated treatment well Patient left: in chair;with call bell/phone within reach  OT Visit Diagnosis: Other abnormalities of gait and mobility (R26.89);Muscle weakness (generalized) (M62.81)                Time: 8965-8895 OT Time Calculation (min): 30 min Charges:  OT General Charges $OT Visit: 1 Visit OT Evaluation $OT Eval Moderate Complexity: 1 Mod OT Treatments $Self Care/Home Management : 8-22 mins  Erven Ramson Chrismon, OTR/L  11/14/24, 11:16  AM   Rayon Mcchristian E Chrismon 11/14/2024, 11:13 AM

## 2024-11-14 NOTE — Evaluation (Signed)
 Physical Therapy Evaluation Patient Details Name: [name] MRN: [id number] DOB: 02-Oct-1951 Today's Date: 11/14/2024  History of Present Illness  73 y.o. female with medical history significant for atrial fibrillation, CKD stage IV, COPD on room air, rheumatoid arthritis, hypertension being admitted to the hospital with cough, shortness of breath likely due to acute exacerbation of COPD.  Clinical Impression  Pt pleasant and eager to work with PT, ultimately she did quite well with ~100 ft of in-room ambulation with walker.  She showed good confidence with regular tight turns and maneuvering, though she reports she tends to like the 4WW at home more due to ease of use and having a place to sit as needed.  She had no LOBs, vitals remained stable and appropriate (high 90s on room air, HR in the 70 even after prolonged upright/ambulation).  Pt will benefit from continued PT to work back to her baseline, but ultimately did move quite well.      If plan is discharge home, recommend the following: Assist for transportation   Can travel by private vehicle        Equipment Recommendations None recommended by PT  Recommendations for Other Services       Functional Status Assessment Patient has had a recent decline in their functional status and demonstrates the ability to make significant improvements in function in a reasonable and predictable amount of time.     Precautions / Restrictions Precautions Precautions: Fall Restrictions Weight Bearing Restrictions Per Provider Order: No      Mobility  Bed Mobility Overal bed mobility: Modified Independent             General bed mobility comments: Pt was able to get up to sitting without issue, did need rails but no physical assist.    Transfers Overall transfer level: Needs assistance Equipment used: Rolling walker (2 wheels) Transfers: Sit to/from Stand Sit to Stand: Min assist, Contact guard assist           General  transfer comment: Pt struggled to stand from standard height bed needing minA to keep hips shifting forward,  pt was able to rise from commode (with rails) and recliner (with arm rests) w/o phyiscal assist.    Ambulation/Gait Ambulation/Gait assistance: Supervision Gait Distance (Feet): 110 Feet Assistive device: Rolling walker (2 wheels)         General Gait Details: Pt was able to ambulate with good confidence for multiple loops in the room, pt typically uses 4WW but managed the FWW well.  spO2 remained in the high 90s on room air, HR in the 70s and pt was able to conversation with only minimal shorteness of breath t/o the effort  Stairs            Wheelchair Mobility     Tilt Bed    Modified Rankin (Stroke Patients Only)       Balance Overall balance assessment: Modified Independent                                           Pertinent Vitals/Pain Pain Assessment Pain Assessment: No/denies pain    Home Living Family/patient expects to be discharged to:: Private residence Living Arrangements: Alone Available Help at Discharge: Available PRN/intermittently Type of Home: Apartment Home Access: Level entry       Home Layout: One level Home Equipment: Rollator (4 wheels);Cane - single point  Prior Function Prior Level of Function : Independent/Modified Independent             Mobility Comments: Mod indep with ADLs, household and limited community mobilization with [phone] but does drive.  Denies fall history; no home O2. ADLs Comments: reports independence with all home and self management     Extremity/Trunk Assessment   Upper Extremity Assessment Upper Extremity Assessment: Overall WFL for tasks assessed;Generalized weakness    Lower Extremity Assessment Lower Extremity Assessment: Overall WFL for tasks assessed;Generalized weakness       Communication   Communication Communication: No apparent difficulties    Cognition  Arousal: Alert Behavior During Therapy: WFL for tasks assessed/performed                             Following commands: Intact       Cueing Cueing Techniques: Verbal cues     General Comments General comments (skin integrity, edema, etc.): Pt moved in her isolation room with relative ease, able to manage in home distances and mobility confidently    Exercises     Assessment/Plan    PT Assessment Patient needs continued PT services  PT Problem List Decreased activity tolerance;Decreased balance;Decreased safety awareness;Cardiopulmonary status limiting activity       PT Treatment Interventions DME instruction;Gait training;Functional mobility training;Therapeutic activities;Therapeutic exercise;Balance training;Neuromuscular re-education;Patient/family education    PT Goals (Current goals can be found in the Care Plan section)       Frequency Min 2X/week     Co-evaluation               AM-PAC PT 6 Clicks Mobility  Outcome Measure Help needed turning from your back to your side while in a flat bed without using bedrails?: None Help needed moving from lying on your back to sitting on the side of a flat bed without using bedrails?: None Help needed moving to and from a bed to a chair (including a wheelchair)?: None Help needed standing up from a chair using your arms (e.g., wheelchair or bedside chair)?: A Little Help needed to walk in hospital room?: None Help needed climbing 3-5 steps with a railing? : A Little 6 Click Score: 22    End of Session   Activity Tolerance: Patient tolerated treatment well Patient left: in chair;with call bell/phone within reach Nurse Communication: Mobility status PT Visit Diagnosis: Muscle weakness (generalized) (M62.81);Difficulty in walking, not elsewhere classified (R26.2)    Time: 9174-9147 PT Time Calculation (min) (ACUTE ONLY): 27 min   Charges:   PT Evaluation $PT Eval Low Complexity: 1 Low PT  Treatments $Gait Training: 8-22 mins PT General Charges $$ ACUTE PT VISIT: 1 Visit         Carmin JONELLE Deed, DPT 11/14/2024, 9:57 AM

## 2024-11-14 NOTE — Assessment & Plan Note (Signed)
 Lab corrected the mistake stating that 1/4 blood cultures with gram positive cocci with no identification.  Likely a contaminant UA does not look infected. - Discontinue cefepime  - Repeat blood culture

## 2024-11-14 NOTE — Assessment & Plan Note (Signed)
 Likely secondary to influenza A, chest x-ray was negative for any acute abnormality and procalcitonin negative. - Continue with Tamiflu  to complete a 5-day course -Continue with Zithromax  -Continue with prednisone  -Continue with bronchodilators -Continue with supportive care

## 2024-11-14 NOTE — Progress Notes (Signed)
 Progress Note   Patient: [name] FMW:[id number] DOB: April 10, 1951 DOA: 11/12/2024     1 DOS: the patient was seen and examined on 11/14/2024   Brief hospital course: Partly taken from H&P.  AERIELLE STOKLOSA is a 73 y.o. female with medical history significant for atrial fibrillation, CKD stage IV, COPD on room air, rheumatoid arthritis, hypertension being admitted to the hospital with cough, shortness of breath likely due to acute exacerbation of COPD.   Respiratory panel positive for influenza A.  Procalcitonin negative, chest x-ray with no acute abnormalities.  Patient was started on Solu-Medrol  and Tamiflu .  11/27: Vitals and labs stable.1/4 blood culture bottles with gram-negative rods with no identification.  Starting on cefepime  while waiting for further results.  UA was not consistent with UTI. PT and OT ordered.  11/28: Hemodynamically stable, lab corrected that it was 1/4 gram-positive cocci which is most likely a contaminant, discontinuing cefepime  and repeating blood cultures.  PT and OT are recommending home health.  Assessment and Plan: * COPD with acute exacerbation (HCC) Likely secondary to influenza A, chest x-ray was negative for any acute abnormality and procalcitonin negative. - Continue with Tamiflu  to complete a 5-day course -Continue with Zithromax  -Continue with prednisone  -Continue with bronchodilators -Continue with supportive care  Positive blood culture Lab corrected the mistake stating that 1/4 blood cultures with gram positive cocci with no identification.  Likely a contaminant UA does not look infected. - Discontinue cefepime  - Repeat blood culture  Atrial fibrillation (HCC) Rate controlled, not on any rate control medications. - Continue with Eliquis   Chronic kidney disease (CKD), stage III (moderate) (HCC) Slight increase in creatinine but not meeting the criteria for AKI.  Mostly within baseline. -Monitor renal function -Avoid  nephrotoxins  Hypertension - Continue home amlodipine   Rheumatoid arthritis (HCC) Patient was on weekly methotrexate  and Plaquenil  at home. - Continue Plaquenil  for now  HLD (hyperlipidemia) - Continue home statin  Obesity, Class III, BMI 40-49.9 (morbid obesity) (HCC) Estimated body mass index is 48.26 kg/m as calculated from the following:   Height as of 08/25/24: 5' 6 (1.676 m).   Weight as of this encounter: 135.6 kg.   - This will complicate overall prognosis  Subjective: Patient was seen and examined today.  No urinary symptoms.  No abdominal pain, no nausea, vomiting or diarrhea.  Physical Exam: Vitals:   11/13/24 1957 11/13/24 2021 11/14/24 0714 11/14/24 0752  BP:  (!) 130/59  (!) 161/79  Pulse:  81  67  Resp:  18  20  Temp:  98.4 F (36.9 C)  98.9 F (37.2 C)  TempSrc:  Oral    SpO2: 94% 95% 95% 97%  Weight:       General.  Morbidly obese lady, in no acute distress. Pulmonary.  Lungs clear bilaterally, normal respiratory effort. CV.  Regular rate and rhythm, no JVD, rub or murmur. Abdomen.  Soft, nontender, nondistended, BS positive. CNS.  Alert and oriented .  No focal neurologic deficit. Extremities.  No edema,  pulses intact and symmetrical. Psychiatry.  Judgment and insight appears normal.    Data Reviewed: Prior data reviewed  Family Communication: Discussed with daughter on phone  Disposition: Status is: Inpatient Remains inpatient appropriate because: Severity of illness  Planned Discharge Destination: Home with Home Health  DVT prophylaxis.  Eliquis  Time spent: 50 minutes  This record has been created using Conservation officer, historic buildings. Errors have been sought and corrected,but may not always be located. Such creation errors do  not reflect on the standard of care.   Author: Amaryllis Dare, MD 11/14/2024 3:20 PM  For on call review [email or web address].

## 2024-11-15 DIAGNOSIS — R7881 Bacteremia: Secondary | ICD-10-CM | POA: Diagnosis not present

## 2024-11-15 DIAGNOSIS — J441 Chronic obstructive pulmonary disease with (acute) exacerbation: Secondary | ICD-10-CM | POA: Diagnosis not present

## 2024-11-15 DIAGNOSIS — I4821 Permanent atrial fibrillation: Secondary | ICD-10-CM | POA: Diagnosis not present

## 2024-11-15 DIAGNOSIS — J101 Influenza due to other identified influenza virus with other respiratory manifestations: Secondary | ICD-10-CM | POA: Diagnosis not present

## 2024-11-15 LAB — CULTURE, BLOOD (ROUTINE X 2): Special Requests: ADEQUATE

## 2024-11-15 MED ORDER — DM-GUAIFENESIN ER 30-600 MG PO TB12: 1.0000 | ORAL_TABLET | Freq: Two times a day (BID) | ORAL | 0 refills | Status: AC | PRN

## 2024-11-15 MED ORDER — BUDESONIDE-FORMOTEROL FUMARATE 80-4.5 MCG/ACT IN AERO: 2.0000 | INHALATION_SPRAY | Freq: Two times a day (BID) | RESPIRATORY_TRACT | 12 refills | Status: AC | PRN

## 2024-11-15 MED ORDER — IPRATROPIUM-ALBUTEROL 0.5-2.5 (3) MG/3ML IN SOLN: 3.0000 mL | Freq: Four times a day (QID) | RESPIRATORY_TRACT | 2 refills | Status: AC | PRN

## 2024-11-15 MED ORDER — PREDNISONE 50 MG PO TABS: 50.0000 mg | ORAL_TABLET | Freq: Every day | ORAL | 0 refills | Status: AC

## 2024-11-15 MED ORDER — OSELTAMIVIR PHOSPHATE 75 MG PO CAPS: 75.0000 mg | ORAL_CAPSULE | Freq: Two times a day (BID) | ORAL | 0 refills | Status: AC

## 2024-11-15 MED ORDER — IPRATROPIUM-ALBUTEROL 0.5-2.5 (3) MG/3ML IN SOLN: 3.0000 mL | Freq: Two times a day (BID) | RESPIRATORY_TRACT | Status: DC

## 2024-11-15 NOTE — Progress Notes (Signed)
 Patient verbalized understanding of all discharge instructions, including importance of follow up. Patient awaiting pick up by ride.

## 2024-11-15 NOTE — TOC Transition Note (Signed)
 Transition of Care [facility]) - Discharge Note   Patient Details  Name: [name] MRN: [id number] Date of Birth: 24-Dec-1950  [facility] CM/SW Contact:  Troi Bechtold L Borghild Thaker, LCSW Phone Number: 11/15/2024, 11:14 AM   Clinical Narrative:     Home Health has been arranged with [facility]. Start of care will be 24 to 48 hours after discharge.   No further TOC needs. TOC signing off.         Patient Goals and CMS Choice            Discharge Placement                       Discharge Plan and Services Additional resources added to the After Visit Summary for                                       Social Drivers of Health (SDOH) Interventions SDOH Screenings   Food Insecurity: Patient Declined (11/12/2024)  Housing: Patient Declined (11/12/2024)  Transportation Needs: No Transportation Needs (11/12/2024)  Utilities: Patient Declined (11/12/2024)  Financial Resource Strain: Medium Risk (08/11/2024)   Received from [facility]  Social Connections: Patient Declined (11/12/2024)  Tobacco Use: Low Risk  (11/12/2024)     Readmission Risk Interventions     No data to display

## 2024-11-15 NOTE — Discharge Summary (Signed)
 Physician Discharge Summary   Patient: [name] MRN: [id number] DOB: Nov 22, 1951  Admit date:     11/12/2024  Discharge date: 11/15/24  Discharge Physician: Amaryllis Dare   PCP: [name], MD   Recommendations at discharge:  Please obtain CBC and BMP and follow-up Follow-up with primary care provider  Discharge Diagnoses: Principal Problem:   COPD with acute exacerbation [facility]) Active Problems:   Influenza A virus present   Positive blood culture   Atrial fibrillation (HCC)   Chronic kidney disease (CKD), stage III (moderate) (HCC)   Hypertension   Rheumatoid arthritis (HCC)   HLD (hyperlipidemia)   Obesity, Class III, BMI 40-49.9 (morbid obesity) [facility])   Hospital Course: Partly taken from H&P.  [name] is a 73 y.o. female with medical history significant for atrial fibrillation, CKD stage IV, COPD on room air, rheumatoid arthritis, hypertension being admitted to the hospital with cough, shortness of breath likely due to acute exacerbation of COPD.   Respiratory panel positive for influenza A.  Procalcitonin negative, chest x-ray with no acute abnormalities.  Patient was started on Solu-Medrol  and Tamiflu .  11/27: Vitals and labs stable.1/4 blood culture bottles with gram-negative rods with no identification.  Starting on cefepime  while waiting for further results.  UA was not consistent with UTI. PT and OT ordered.  11/28: Hemodynamically stable, lab corrected that it was 1/4 gram-positive cocci which is most likely a contaminant, discontinuing cefepime  and repeating blood cultures.  PT and OT are recommending home health.  11/29: Remained hemodynamically stable, prior 1/4 blood cultures with bacillus species, most likely contaminant.  Repeat blood cultures are negative. No need to continue antibiotics.  Patient is being discharge with 2 more days of prednisone  and Tamiflu  to complete a total of 5-day course.  A nebulizer machine was earlier ordered  to be delivered at home.  She was also given some DuoNeb to use as needed.  He was given a new prescription of Symbicort.  She will continue on current medications and follow-up with her providers for further assistance.  Assessment and Plan: * COPD with acute exacerbation (HCC) Likely secondary to influenza A, chest x-ray was negative for any acute abnormality and procalcitonin negative. - Continue with Tamiflu  to complete a 5-day course -Continue with Zithromax  -Continue with prednisone  -Continue with bronchodilators  Positive blood culture Lab corrected the mistake stating that 1/4 blood cultures with gram positive cocci which later identified as bacillus.  Likely a contaminant. UA does not look infected. Repeat blood cultures negative.  No need for antibiotics  Atrial fibrillation (HCC) Rate controlled, not on any rate control medications. - Continue with Eliquis   Chronic kidney disease (CKD), stage III (moderate) (HCC) Slight increase in creatinine but not meeting the criteria for AKI.  Mostly within baseline. -Monitor renal function -Avoid nephrotoxins  Hypertension - Continue home amlodipine   Rheumatoid arthritis (HCC) Patient was on weekly methotrexate  and Plaquenil  at home. - Continue Plaquenil  for now  HLD (hyperlipidemia) - Continue home statin  Obesity, Class III, BMI 40-49.9 (morbid obesity) (HCC) Estimated body mass index is 48.26 kg/m as calculated from the following:   Height as of 08/25/24: 5' 6 (1.676 m).   Weight as of this encounter: 135.6 kg.   - This will complicate overall prognosis  Consultants: None Procedures performed: None Disposition: Home health Diet recommendation:  Discharge Diet Orders (From admission, onward)     Start     Ordered   11/15/24 0000  Diet - low sodium heart  healthy        11/15/24 1032           Cardiac diet DISCHARGE MEDICATION: Allergies as of 11/15/2024       Reactions   Sulfa Antibiotics Hives, Itching    Leflunomide Itching   Amoxicillin-pot Clavulanate Nausea Only   Valdecoxib Rash        Medication List     STOP taking these medications    cyclobenzaprine  5 MG tablet Commonly known as: FLEXERIL    mometasone  0.1 % ointment Commonly known as: ELOCON    potassium chloride  10 MEQ tablet Commonly known as: KLOR-CON    predniSONE  10 MG (21) Tbpk tablet Commonly known as: STERAPRED UNI-PAK 21 TAB Replaced by: predniSONE  50 MG tablet   triamcinolone  0.025 % cream Commonly known as: KENALOG    triamcinolone  cream 0.1 % Commonly known as: KENALOG        TAKE these medications    acetaminophen  500 MG tablet Commonly known as: TYLENOL  Take 1-2 tablets (500-1,000 mg total) by mouth every 6 (six) hours as needed for mild pain (pain score 1-3 or temp > 100.5).   albuterol  108 (90 Base) MCG/ACT inhaler Commonly known as: VENTOLIN  HFA Inhale 2 puffs into the lungs every 6 (six) hours as needed for wheezing or shortness of breath.   amLODipine  5 MG tablet Commonly known as: NORVASC  Take 5 mg by mouth daily. What changed: Another medication with the same name was removed. Continue taking this medication, and follow the directions you see here.   apixaban  5 MG Tabs tablet Commonly known as: ELIQUIS  Take 5 mg by mouth 2 (two) times daily.   atorvastatin  10 MG tablet Commonly known as: LIPITOR Take 5 mg by mouth at bedtime.   budesonide-formoterol  80-4.5 MCG/ACT inhaler Commonly known as: SYMBICORT Inhale 2 puffs into the lungs 2 (two) times daily as needed (shortness of breath or wheezing).   dextromethorphan-guaiFENesin  30-600 MG 12hr tablet Commonly known as: MUCINEX  DM Take 1 tablet by mouth 2 (two) times daily as needed.   docusate sodium  100 MG capsule Commonly known as: COLACE Take 1 capsule (100 mg total) by mouth 2 (two) times daily.   doxepin 50 MG capsule Commonly known as: SINEQUAN Take 50 mg by mouth at bedtime.   famotidine  20 MG tablet Commonly  known as: PEPCID  Take 1 tablet (20 mg total) by mouth 2 (two) times daily for 10 days.   ferrous sulfate  325 (65 FE) MG tablet Take 325 mg by mouth daily with breakfast.   folic acid  1 MG tablet Commonly known as: FOLVITE  Take 1 mg by mouth daily.   gatifloxacin  0.5 % Soln Commonly known as: ZYMAXID  Place 1 drop into the left eye 4 (four) times daily.   hydroxychloroquine  200 MG tablet Commonly known as: PLAQUENIL  Take 200 mg by mouth 2 (two) times daily.   hydrOXYzine  25 MG tablet Commonly known as: ATARAX  Take 25 mg by mouth daily. At HS - 2 tablets   ipratropium-albuterol  0.5-2.5 (3) MG/3ML Soln Commonly known as: DUONEB Take 3 mLs by nebulization every 6 (six) hours as needed (Shortness of breath and wheezing).   ketorolac  0.5 % ophthalmic solution Commonly known as: ACULAR  Place 1 drop into the left eye 4 (four) times daily.   loratadine  10 MG tablet Commonly known as: CLARITIN  Take 10 mg by mouth daily.   methotrexate  2.5 MG tablet Commonly known as: RHEUMATREX Take 15 mg by mouth.  Take 6 tablets (15 mg total) by mouth every 7 (seven) days All  on the same day   ondansetron  4 MG tablet Commonly known as: ZOFRAN  Take 1 tablet (4 mg total) by mouth every 6 (six) hours as needed for nausea.   oseltamivir  75 MG capsule Commonly known as: TAMIFLU  Take 1 capsule (75 mg total) by mouth 2 (two) times daily for 2 days.   PARoxetine  30 MG tablet Commonly known as: PAXIL  Take 30 mg by mouth daily.   predniSONE  50 MG tablet Commonly known as: DELTASONE  Take 1 tablet (50 mg total) by mouth daily with breakfast for 2 days. Replaces: predniSONE  10 MG (21) Tbpk tablet               Durable Medical Equipment  (From admission, onward)           Start     Ordered   11/13/24 1032  For home use only DME Nebulizer machine  Once       Question Answer Comment  Patient needs a nebulizer to treat with the following condition COPD exacerbation (HCC)   Length of  Need Lifetime   Additional equipment included Administration kit   Additional equipment included Filter      11/13/24 1032            Follow-up Information     Feldpausch, Cheryl BRAVO, MD Follow up.   Specialty: Family Medicine Why: hospital follow up; patient to make own appointment as office is closed Contact information: [address] [place] [facility] [address] [phone]                Discharge Exam: [name]   11/12/24 0536  Weight: 135.6 kg   General.  Morbidly obese lady, in no acute distress. Pulmonary.  Mild scattered wheeze bilaterally, normal respiratory effort. CV.  Regular rate and rhythm, no JVD, rub or murmur. Abdomen.  Soft, nontender, nondistended, BS positive. CNS.  Alert and oriented .  No focal neurologic deficit. Extremities.  No edema, no cyanosis, pulses intact and symmetrical. Psychiatry.  Judgment and insight appears normal.   Condition at discharge: stable  The results of significant diagnostics from this hospitalization (including imaging, microbiology, ancillary and laboratory) are listed below for reference.   Imaging Studies: DG Chest Port 1 View Result Date: 11/12/2024 EXAM: 1 VIEW(S) XRAY OF THE CHEST 11/12/2024 05:55:00 AM COMPARISON: 01/28/2024 CLINICAL HISTORY: Questionable sepsis - evaluate for abnormality FINDINGS: LUNGS AND PLEURA: No focal pulmonary opacity. No pleural effusion. No pneumothorax. HEART AND MEDIASTINUM: Stable mild cardiomegaly. Aortic arch calcifications. BONES AND SOFT TISSUES: Thoracic spondylosis. IMPRESSION: 1. No acute findings. 2. Stable mild cardiomegaly and aortic arch calcifications. Electronically signed by: Evalene Coho MD 11/12/2024 06:06 AM EST RP Workstation: [id number]    Microbiology: Results for orders placed or performed during the hospital encounter of 11/12/24  Blood Culture (routine x 2)     Status: Abnormal   Collection Time: 11/12/24  5:42 AM   Specimen: BLOOD  Result Value Ref  Range Status   Specimen Description   Final    BLOOD LEFT ANTECUBITAL Performed at [facility], [address], [place], [place] [address]    Special Requests   Final    BOTTLES DRAWN AEROBIC AND ANAEROBIC Blood Culture adequate volume Performed at [facility], [address] [place], [place] [address]    Culture  Setup Time   Final    CORRECTED RESULTS GRAM POSITIVE RODS PREVIOUSLY REPORTED AS: GRAM NEGATIVE RODS CORRECTED RESULTS CALLED TO: PHARMD AMANDA T [id number] FCP  Culture (A)  Final    BACILLUS SPECIES Standardized susceptibility testing for this organism is not available. Performed at [facility], [address] [place], [place] [address]    Report Status 11/15/2024 FINAL  Final  Blood Culture (routine x 2)     Status: None (Preliminary result)   Collection Time: 11/12/24  5:42 AM   Specimen: BLOOD  Result Value Ref Range Status   Specimen Description BLOOD BLOOD RIGHT FOREARM  Final   Special Requests   Final    BOTTLES DRAWN AEROBIC AND ANAEROBIC Blood Culture adequate volume   Culture   Final    NO GROWTH 3 DAYS Performed at [facility], [address] [place], [place] [address]    Report Status PENDING  Incomplete  Blood Culture ID Panel (Reflexed)     Status: None   Collection Time: 11/12/24  5:42 AM  Result Value Ref Range Status   Enterococcus faecalis NOT DETECTED NOT DETECTED Final   Enterococcus Faecium NOT DETECTED NOT DETECTED Final   Listeria monocytogenes NOT DETECTED NOT DETECTED Final   Staphylococcus species NOT DETECTED NOT DETECTED Final   Staphylococcus aureus (BCID) NOT DETECTED NOT DETECTED Final   Staphylococcus epidermidis NOT DETECTED NOT DETECTED Final   Staphylococcus lugdunensis NOT DETECTED NOT DETECTED Final   Streptococcus species NOT DETECTED NOT DETECTED Final   Streptococcus agalactiae NOT DETECTED NOT DETECTED Final   Streptococcus pneumoniae NOT DETECTED NOT DETECTED Final    Streptococcus pyogenes NOT DETECTED NOT DETECTED Final   A.calcoaceticus-baumannii NOT DETECTED NOT DETECTED Final   Bacteroides fragilis NOT DETECTED NOT DETECTED Final   Enterobacterales NOT DETECTED NOT DETECTED Final   Enterobacter cloacae complex NOT DETECTED NOT DETECTED Final   Escherichia coli NOT DETECTED NOT DETECTED Final   Klebsiella aerogenes NOT DETECTED NOT DETECTED Final   Klebsiella oxytoca NOT DETECTED NOT DETECTED Final   Klebsiella pneumoniae NOT DETECTED NOT DETECTED Final   Proteus species NOT DETECTED NOT DETECTED Final   Salmonella species NOT DETECTED NOT DETECTED Final   Serratia marcescens NOT DETECTED NOT DETECTED Final   Haemophilus influenzae NOT DETECTED NOT DETECTED Final   Neisseria meningitidis NOT DETECTED NOT DETECTED Final   Pseudomonas aeruginosa NOT DETECTED NOT DETECTED Final   Stenotrophomonas maltophilia NOT DETECTED NOT DETECTED Final   Candida albicans NOT DETECTED NOT DETECTED Final   Candida auris NOT DETECTED NOT DETECTED Final   Candida glabrata NOT DETECTED NOT DETECTED Final   Candida krusei NOT DETECTED NOT DETECTED Final   Candida parapsilosis NOT DETECTED NOT DETECTED Final   Candida tropicalis NOT DETECTED NOT DETECTED Final   Cryptococcus neoformans/gattii NOT DETECTED NOT DETECTED Final    Comment: Performed at [facility], [address] [place], [place] [address]  Resp panel by RT-PCR (RSV, Flu A&B, Covid) Anterior Nasal Swab     Status: Abnormal   Collection Time: 11/12/24  5:55 AM   Specimen: Anterior Nasal Swab  Result Value Ref Range Status   SARS Coronavirus 2 by RT PCR NEGATIVE NEGATIVE Final    Comment: (NOTE) SARS-CoV-2 target nucleic acids are NOT DETECTED.  The SARS-CoV-2 RNA is generally detectable in upper respiratory specimens during the acute phase of infection. The lowest concentration of SARS-CoV-2 viral copies this assay can detect is 138 copies/mL. A negative result does not preclude  SARS-Cov-2 infection and should not be used as the sole basis for treatment or other patient management decisions. A negative result may occur with  improper specimen  collection/handling, submission of specimen other than nasopharyngeal swab, presence of viral mutation(s) within the areas targeted by this assay, and inadequate number of viral copies(<138 copies/mL). A negative result must be combined with clinical observations, patient history, and epidemiological information. The expected result is Negative.  Fact Sheet for Patients:  bloggercourse.com  Fact Sheet for Healthcare Providers:  seriousbroker.it  This test is no t yet approved or cleared by the [place]  FDA and  has been authorized for detection and/or diagnosis of SARS-CoV-2 by FDA under an Emergency Use Authorization (EUA). This EUA will remain  in effect (meaning this test can be used) for the duration of the COVID-19 declaration under Section 564(b)(1) of the Act, 21 U.S.C.section 360bbb-3(b)(1), unless the authorization is terminated  or revoked sooner.       Influenza A by PCR POSITIVE (A) NEGATIVE Final   Influenza B by PCR NEGATIVE NEGATIVE Final    Comment: (NOTE) The Xpert Xpress SARS-CoV-2/FLU/RSV plus assay is intended as an aid in the diagnosis of influenza from Nasopharyngeal swab specimens and should not be used as a sole basis for treatment. Nasal washings and aspirates are unacceptable for Xpert Xpress SARS-CoV-2/FLU/RSV testing.  Fact Sheet for Patients: bloggercourse.com  Fact Sheet for Healthcare Providers: seriousbroker.it  This test is not yet approved or cleared by the [place]  FDA and has been authorized for detection and/or diagnosis of SARS-CoV-2 by FDA under an Emergency Use Authorization (EUA). This EUA will remain in effect (meaning this test can be used) for the duration of  the COVID-19 declaration under Section 564(b)(1) of the Act, 21 U.S.C. section 360bbb-3(b)(1), unless the authorization is terminated or revoked.     Resp Syncytial Virus by PCR NEGATIVE NEGATIVE Final    Comment: (NOTE) Fact Sheet for Patients: bloggercourse.com  Fact Sheet for Healthcare Providers: seriousbroker.it  This test is not yet approved or cleared by the [place]  FDA and has been authorized for detection and/or diagnosis of SARS-CoV-2 by FDA under an Emergency Use Authorization (EUA). This EUA will remain in effect (meaning this test can be used) for the duration of the COVID-19 declaration under Section 564(b)(1) of the Act, 21 U.S.C. section 360bbb-3(b)(1), unless the authorization is terminated or revoked.  Performed at [facility], [address] [place], [place] [address]   MRSA Next Gen by PCR, Nasal     Status: None   Collection Time: 11/13/24 12:40 PM   Specimen: Nasal Mucosa; Nasal Swab  Result Value Ref Range Status   MRSA by PCR Next Gen NOT DETECTED NOT DETECTED Final    Comment: (NOTE) The GeneXpert MRSA Assay (FDA approved for NASAL specimens only), is one component of a comprehensive MRSA colonization surveillance program. It is not intended to diagnose MRSA infection nor to guide or monitor treatment for MRSA infections. Test performance is not FDA approved in patients less than 73 years old. Performed at [facility], [address] [place], [place] [address]   Culture, blood (Routine X 2) w Reflex to ID Panel     Status: None (Preliminary result)   Collection Time: 11/14/24 11:36 AM   Specimen: BLOOD  Result Value Ref Range Status   Specimen Description BLOOD BLOOD RIGHT HAND  Final   Special Requests   Final    BOTTLES DRAWN AEROBIC AND ANAEROBIC Blood Culture adequate volume   Culture   Final    NO GROWTH < 24 HOURS Performed at [facility], [address], [place], [place] [address]  Report Status PENDING  Incomplete  Culture, blood (Routine X 2) w Reflex to ID Panel     Status: None (Preliminary result)   Collection Time: 11/14/24 11:50 AM   Specimen: BLOOD  Result Value Ref Range Status   Specimen Description BLOOD BLOOD LEFT HAND  Final   Special Requests   Final    BOTTLES DRAWN AEROBIC AND ANAEROBIC Blood Culture results may not be optimal due to an inadequate volume of blood received in culture bottles   Culture   Final    NO GROWTH < 24 HOURS Performed at [facility], [address] [place], [place] [address]    Report Status PENDING  Incomplete    Labs: CBC: Recent Labs  Lab 11/12/24 0543 11/13/24 0522  WBC 4.0 4.7  NEUTROABS 2.0  --   HGB 12.2 11.4*  HCT 38.0 35.9*  MCV 96.4 96.8  PLT 124* 130*   Basic Metabolic Panel: Recent Labs  Lab 11/12/24 0543 11/13/24 0522  NA 140 138  K 3.7 3.9  CL 104 105  CO2 26 24  GLUCOSE 103* 128*  BUN 12 20  CREATININE 1.05* 1.18*  CALCIUM  8.6* 8.1*   Liver Function Tests: Recent Labs  Lab 11/12/24 0543  AST 33  ALT 19  ALKPHOS 110  BILITOT 0.6  PROT 6.5  ALBUMIN 4.0   CBG: No results for input(s): GLUCAP in the last 168 hours.  Discharge time spent: greater than 30 minutes.  This record has been created using Conservation officer, historic buildings. Errors have been sought and corrected,but may not always be located. Such creation errors do not reflect on the standard of care.   Signed: Amaryllis Dare, MD [facility] 11/15/2024

## 2024-11-15 NOTE — Discharge Instructions (Signed)
 Home Health has been set up with [facility]. Start of care will be 24 to 48 hours after discharge.

## 2024-11-17 LAB — CULTURE, BLOOD (ROUTINE X 2)
Culture: NO GROWTH
Special Requests: ADEQUATE

## 2024-11-19 LAB — CULTURE, BLOOD (ROUTINE X 2)
Culture: NO GROWTH
Culture: NO GROWTH
Special Requests: ADEQUATE
